# Patient Record
Sex: Female | Born: 1950
Health system: Southern US, Community
[De-identification: ages and names within clinical notes are randomized; demographics above are authoritative.]

## PROBLEM LIST (undated history)

## (undated) DIAGNOSIS — T8859XA Other complications of anesthesia, initial encounter: Secondary | ICD-10-CM

## (undated) DIAGNOSIS — N189 Chronic kidney disease, unspecified: Secondary | ICD-10-CM

## (undated) DIAGNOSIS — K219 Gastro-esophageal reflux disease without esophagitis: Secondary | ICD-10-CM

## (undated) DIAGNOSIS — E782 Mixed hyperlipidemia: Secondary | ICD-10-CM

## (undated) DIAGNOSIS — M67919 Unspecified disorder of synovium and tendon, unspecified shoulder: Secondary | ICD-10-CM

## (undated) DIAGNOSIS — M1990 Unspecified osteoarthritis, unspecified site: Secondary | ICD-10-CM

## (undated) DIAGNOSIS — G589 Mononeuropathy, unspecified: Secondary | ICD-10-CM

## (undated) DIAGNOSIS — T4145XA Adverse effect of unspecified anesthetic, initial encounter: Secondary | ICD-10-CM

## (undated) DIAGNOSIS — I10 Essential (primary) hypertension: Secondary | ICD-10-CM

## (undated) HISTORY — DX: Mononeuropathy, unspecified: G58.9

## (undated) HISTORY — DX: Chronic kidney disease, unspecified: N18.9

## (undated) HISTORY — DX: Mixed hyperlipidemia: E78.2

## (undated) HISTORY — PX: WISDOM TOOTH EXTRACTION: SHX21

## (undated) HISTORY — DX: Essential (primary) hypertension: I10

## (undated) HISTORY — PX: TUBAL LIGATION: SHX77

## (undated) HISTORY — PX: NECK SURGERY: SHX720

## (undated) HISTORY — PX: ABDOMINAL HYSTERECTOMY: SHX81

## (undated) HISTORY — PX: CHOLECYSTECTOMY: SHX55

## (undated) HISTORY — PX: CARPAL TUNNEL RELEASE: SHX101

## (undated) HISTORY — PX: BILATERAL OOPHORECTOMY: SHX1221

## (undated) HISTORY — PX: TONSILLECTOMY: SUR1361

---

## 2001-03-27 ENCOUNTER — Ambulatory Visit (HOSPITAL_COMMUNITY): Admission: RE | Admit: 2001-03-27 | Discharge: 2001-03-27 | Payer: Self-pay | Admitting: Neurosurgery

## 2001-06-30 ENCOUNTER — Emergency Department (HOSPITAL_COMMUNITY): Admission: EM | Admit: 2001-06-30 | Discharge: 2001-06-30 | Payer: Self-pay | Admitting: Emergency Medicine

## 2001-07-01 ENCOUNTER — Encounter: Payer: Self-pay | Admitting: *Deleted

## 2001-07-01 ENCOUNTER — Emergency Department (HOSPITAL_COMMUNITY): Admission: EM | Admit: 2001-07-01 | Discharge: 2001-07-01 | Payer: Self-pay | Admitting: Emergency Medicine

## 2001-08-08 ENCOUNTER — Encounter: Admission: RE | Admit: 2001-08-08 | Discharge: 2001-08-08 | Payer: Self-pay | Admitting: Neurological Surgery

## 2001-08-08 ENCOUNTER — Encounter: Payer: Self-pay | Admitting: Neurological Surgery

## 2001-11-19 ENCOUNTER — Encounter: Payer: Self-pay | Admitting: General Surgery

## 2001-11-19 ENCOUNTER — Ambulatory Visit (HOSPITAL_COMMUNITY): Admission: RE | Admit: 2001-11-19 | Discharge: 2001-11-19 | Payer: Self-pay | Admitting: General Surgery

## 2002-12-09 ENCOUNTER — Encounter: Payer: Self-pay | Admitting: General Surgery

## 2002-12-09 ENCOUNTER — Ambulatory Visit (HOSPITAL_COMMUNITY): Admission: RE | Admit: 2002-12-09 | Discharge: 2002-12-09 | Payer: Self-pay | Admitting: General Surgery

## 2002-12-19 ENCOUNTER — Encounter: Admission: RE | Admit: 2002-12-19 | Discharge: 2002-12-19 | Payer: Self-pay | Admitting: Neurological Surgery

## 2002-12-19 ENCOUNTER — Encounter: Payer: Self-pay | Admitting: Neurological Surgery

## 2003-01-17 ENCOUNTER — Encounter: Payer: Self-pay | Admitting: Neurological Surgery

## 2003-01-17 ENCOUNTER — Encounter: Admission: RE | Admit: 2003-01-17 | Discharge: 2003-01-17 | Payer: Self-pay | Admitting: Neurological Surgery

## 2003-01-31 ENCOUNTER — Encounter: Admission: RE | Admit: 2003-01-31 | Discharge: 2003-01-31 | Payer: Self-pay | Admitting: Neurological Surgery

## 2003-01-31 ENCOUNTER — Encounter: Payer: Self-pay | Admitting: Neurological Surgery

## 2004-01-19 ENCOUNTER — Ambulatory Visit (HOSPITAL_COMMUNITY): Admission: RE | Admit: 2004-01-19 | Discharge: 2004-01-19 | Payer: Self-pay | Admitting: General Surgery

## 2005-02-25 ENCOUNTER — Ambulatory Visit (HOSPITAL_COMMUNITY): Admission: RE | Admit: 2005-02-25 | Discharge: 2005-02-25 | Payer: Self-pay | Admitting: General Surgery

## 2006-02-27 ENCOUNTER — Ambulatory Visit (HOSPITAL_COMMUNITY): Admission: RE | Admit: 2006-02-27 | Discharge: 2006-02-27 | Payer: Self-pay | Admitting: General Surgery

## 2007-03-05 ENCOUNTER — Ambulatory Visit (HOSPITAL_COMMUNITY): Admission: RE | Admit: 2007-03-05 | Discharge: 2007-03-05 | Payer: Self-pay | Admitting: General Surgery

## 2008-01-08 ENCOUNTER — Ambulatory Visit (HOSPITAL_COMMUNITY): Admission: RE | Admit: 2008-01-08 | Discharge: 2008-01-08 | Payer: Self-pay | Admitting: Pulmonary Disease

## 2008-02-26 ENCOUNTER — Ambulatory Visit: Payer: Self-pay | Admitting: Internal Medicine

## 2008-02-26 ENCOUNTER — Encounter: Payer: Self-pay | Admitting: Internal Medicine

## 2008-02-26 ENCOUNTER — Ambulatory Visit (HOSPITAL_COMMUNITY): Admission: RE | Admit: 2008-02-26 | Discharge: 2008-02-26 | Payer: Self-pay | Admitting: Internal Medicine

## 2008-03-06 ENCOUNTER — Ambulatory Visit (HOSPITAL_COMMUNITY): Admission: RE | Admit: 2008-03-06 | Discharge: 2008-03-06 | Payer: Self-pay | Admitting: General Surgery

## 2009-03-10 ENCOUNTER — Ambulatory Visit (HOSPITAL_COMMUNITY): Admission: RE | Admit: 2009-03-10 | Discharge: 2009-03-10 | Payer: Self-pay | Admitting: General Surgery

## 2010-03-16 ENCOUNTER — Ambulatory Visit (HOSPITAL_COMMUNITY): Admission: RE | Admit: 2010-03-16 | Discharge: 2010-03-16 | Payer: Self-pay | Admitting: General Surgery

## 2011-03-01 ENCOUNTER — Other Ambulatory Visit (HOSPITAL_COMMUNITY): Payer: Self-pay | Admitting: Pulmonary Disease

## 2011-03-01 DIAGNOSIS — Z139 Encounter for screening, unspecified: Secondary | ICD-10-CM

## 2011-03-22 ENCOUNTER — Ambulatory Visit (HOSPITAL_COMMUNITY)
Admission: RE | Admit: 2011-03-22 | Discharge: 2011-03-22 | Disposition: A | Discharge status: Home / Self Care | Payer: Managed Care, Other (non HMO) | Source: Ambulatory Visit | Attending: Pulmonary Disease | Admitting: Pulmonary Disease

## 2011-03-22 DIAGNOSIS — Z1231 Encounter for screening mammogram for malignant neoplasm of breast: Secondary | ICD-10-CM | POA: Insufficient documentation

## 2011-03-22 DIAGNOSIS — Z139 Encounter for screening, unspecified: Secondary | ICD-10-CM

## 2011-04-14 ENCOUNTER — Encounter: Payer: Self-pay | Admitting: Cardiology

## 2011-04-19 NOTE — Op Note (Signed)
NAME:  Kathryn Lynn, Kathryn Lynn                 ACCOUNT NO.:  [id number]   MEDICAL RECORD NO.:  [id number]          PATIENT TYPE:  AMB   LOCATION:  DAY                           FACILITY:  [facility]   PHYSICIAN:  R. Roetta Sessions, M.D. DATE OF BIRTH:  1951/02/13   DATE OF PROCEDURE:  DATE OF DISCHARGE:                               OPERATIVE REPORT   PROCEDURE:  Ileocolonoscopy with biopsy.   INDICATIONS FOR PROCEDURE:  A 56-year Caucasian female with no lower GI  tract symptoms sent over out of the courtesy of Dr. Juanetta Gosling for  colorectal cancer screening.  She has never had her lower GI tract  imaged.  There is no family history of colon cancer.  Colonoscopy is now  being done as a screening maneuver.  The risks, benefits, alternatives  and limitations have been reviewed.  All party's questions were  answered.  All parties were agreeable.  Please see the documentation in  the medical record.   PROCEDURE NOTE:  O2 saturation, blood pressure, pulse, and respirations  were monitored throughout the entire procedure.  Conscious sedation:  Versed 4 mg IV, Demerol 50 mg IV in divided doses.  Instrument:  Pentax  video chip system.   FINDINGS:  Digital rectal exam revealed no abnormalities.  Endoscopic  findings:  The prep was adequate.  Colon:  The colonic mucosa was  surveyed from the rectosigmoid junction through the left, transverse,  right colon to the area of the ileocecal valve and appendiceal orifice.  These structures well seen and photographed for the record.  The  terminal ileum was intubated to 10 cm.  From this level, the scope was  slowly withdrawn, and all previously mentioned mucosal surfaces were  again seen.  In the mid ascending colon, there was an oval 3 x 5-cm area  with circular folds coming out.  Some of these folds were very shallow.  This did not appear to be an out and out polyp.  It almost appeared to  be possibly an inverted tic, although there were no diverticula seen  otherwise.  I cold biopsied this lesion.  Please see photos.  The  remainder of the colonic mucosa appeared normal.  The scope was pulled  down to the rectum where thorough examination of the rectal mucosa  including retroflex view of the anal verge demonstrated no  abnormalities.  The patient tolerated the procedure well and was  reactive in endoscopy.   IMPRESSION:  1. Normal rectum.  2. Polypoid mucosa, as described above, in ascending segment, likely      not clinically significant, status post cold biopsy.  Otherwise,      the remainder of colonic mucosa and  terminal mucosa appeared      normal.   RECOMMENDATIONS:  1. Follow up on pathology.  2. Further recommendations to follow.      Jonathon Bellows, M.D.  Electronically Signed     RMR/MEDQ  D:  02/26/2008  T:  02/26/2008  Job:  [id number]

## 2011-04-22 NOTE — Consult Note (Signed)
[place]. [facility]  Patient:    Kathryn Lynn, Kathryn Lynn                          MRN: [id number] Proc. Date: 07/01/01 Adm. Date:  [id number] Attending:  Doug Sou                          Consultation Report  REFERRING PHYSICIAN:  Lorre Nick, M.D.  REASON FOR CONSULTATION:  Cervical spondylosis with radiculopathy.  HISTORY OF PRESENT ILLNESS:  The patient is a 60 year old individual who has had significant neck, shoulder, and left arm pain that got worse severely yesterday. She was seen in the emergency room and given some analgesic pain medication. The patient noted that the pain worsened to involve the left arm, the region of the breast, the back of the shoulder, with numbness and tingling in the hands. She contacted the office today and was advised to return to the emergency room for further evaluation.  The patient notes that in 1997 she had a herniated nucleus pulposus at the C5-C6 level and was operated on by Danae Orleans. Venetia Maxon, M.D. from our office with a posterior approach. She did well after this surgery and notes that much of the pain that she has now seems to replicate what she was experiencing then. She feels now it is more severe.  On her arrival here, an MRI of the cervical spine was ordered. This study on review demonstrates spondylitic disease at C4-5 and C5-6 with biforaminal stenosis at each level but not so severe as to require immediate surgical intervention.  PAST MEDICAL HISTORY:  Reveals that the patients general health has been good. She does have some gastroesophageal reflux disease since her cholecystectomy some years ago and uses a generic form of Tagamet on a p.r.n. basis.  CURRENT MEDICATIONS:  She uses no other medication on a chronic basis, save for Premarin.  SOCIAL HISTORY:  Reveals that she is married. 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  REVIEW OF SYSTEMS:  She underwent carpal tunnel release on  the left hand in May of this year by Danae Orleans. Venetia Maxon, M.D.  PHYSICAL EXAMINATION:  NEUROLOGICAL:  She is an alert, oriented, cooperative individual in modest distress with left shoulder and neck pain. There is tenderness to palpation in the supraclavicular fossa on the left side. No masses are noted. No bruits are heard. Range of motion is limited turning 30 degrees to the left. She has a positive Spurling maneuver. Right-sided strength and reflexes are normal. No tenderness or masses are noted on her right side. Lower extremity strength and reflexes are 2+ in the patellae, 1+ in the Achilles. Notably, the patient notes that she has had some numbness and aching in the lower extremities also.  IMPRESSION:  The patient has cervical spondylosis with left cervical radiculopathy. At this time, I have advised her that we treat this with a powerful anti-inflammatory in the form of some IM Decadron followed by an oral course of steroids for a 10-day period. The patient did deny any history of diabetes at this time. Hopefully, she will tolerate this medication well and get good relief. I have also written a prescription for some hydrocodone for pain. I have advised that the patient should remain out of work for the next two days time, and I have also written for the patient to be placed in a hard cervical  collar. The follow-up will be on an outpatient basis. DD:  07/01/01 TD:  07/02/01 Job: [id number]

## 2011-04-22 NOTE — Op Note (Signed)
[place]. [facility]  Patient:    Kathryn Lynn, Kathryn Lynn                          MRN: [id number] Proc. Date: 03/27/01 Adm. Date:  [id number] Disc. Date: [id number] Attending:  Josie Saunders                           Operative Report  PREOPERATIVE DIAGNOSIS:  Left carpal tunnel syndrome.  POSTOPERATIVE DIAGNOSIS:  Left carpal tunnel syndrome.  PROCEDURE:  Left carpal tunnel release.  SURGEON:  Danae Orleans. Venetia Maxon, M.D.  ANESTHESIA:  Local lidocaine with IV sedation.  COMPLICATIONS:  None.  DISPOSITION:  Recovery.  INDICATIONS:  Kathryn Lynn is a 60 year old right-handed woman, who has a history of bilateral carpal tunnel syndrome, left more affected than right confirmed with EMG nerve conduction velocity, who has tried to get by without surgery, but has had progressively increasing pain and numbness and it was elected to go ahead with left carpal tunnel release as this is the most affected side.  DESCRIPTION OF PROCEDURE:  Ms. Adan was is brought to the operating room. She had intravenous access established and had a intravenous sedation and her left hand and forearm were then prepped and draped in the usual sterile fashion.  Area of plain incision was infiltrated with 1% local lidocaine without epinephrine.  The incision was made along the line of the fourth ray from the first volar wrist crease into the palm over approximately 3 cm length.  This was carried through subcutaneous fat to the flexor retinaculum, which was incised sharply with a 15-blade.  The carpal tunnel was identified and was further released into the palm and also the flexor retinaculum was transected more proximally up into the forearm.  Hemostasis was assured with bipolar electrocautery.  The wound was then copiously irrigated with bacitracin saline.  The carpal tunnel was felt to be well released.  The skin edges were then reapproximated with interrupted 3-0 nylon vertical  mattress sutures.  The wound was dressed with bacitracin, Telfa, Kerlix fluff, Kerlix wrap and Kling dressing.  Patient was taken to the recovery room in stable satisfactory condition, having tolerated her operation.  Counts were correct at the end of the case. DD:  03/27/01 TD:  03/27/01 Job: [id number]

## 2011-05-03 ENCOUNTER — Ambulatory Visit (INDEPENDENT_AMBULATORY_CARE_PROVIDER_SITE_OTHER): Payer: Managed Care, Other (non HMO) | Admitting: Cardiology

## 2011-05-03 ENCOUNTER — Encounter: Payer: Self-pay | Admitting: Cardiology

## 2011-05-03 ENCOUNTER — Encounter: Payer: Self-pay | Admitting: *Deleted

## 2011-05-03 ENCOUNTER — Other Ambulatory Visit: Payer: Self-pay | Admitting: *Deleted

## 2011-05-03 ENCOUNTER — Telehealth: Payer: Self-pay | Admitting: *Deleted

## 2011-05-03 VITALS — BP 98/63 | HR 75 | Ht 62.0 in | Wt 184.0 lb

## 2011-05-03 DIAGNOSIS — I10 Essential (primary) hypertension: Secondary | ICD-10-CM | POA: Insufficient documentation

## 2011-05-03 DIAGNOSIS — R079 Chest pain, unspecified: Secondary | ICD-10-CM | POA: Insufficient documentation

## 2011-05-03 DIAGNOSIS — E782 Mixed hyperlipidemia: Secondary | ICD-10-CM | POA: Insufficient documentation

## 2011-05-03 HISTORY — DX: Essential (primary) hypertension: I10

## 2011-05-03 NOTE — Patient Instructions (Addendum)
Your physician has requested that you have en exercise stress cardiolite. For further information please visit [email or web address] Please follow instruction sheet, as given. Your follow up will be based on your test results. If the results of your test are normal or stable, you will receive a letter. If they are abnormal, the nurse will contact you by phone. Your physician recommends that you continue on your current medications as directed. Please refer to the Current Medication list given to you today.

## 2011-05-03 NOTE — Assessment & Plan Note (Signed)
Blood pressure very well controlled today on present regimen.

## 2011-05-03 NOTE — Telephone Encounter (Signed)
CHECKING PERCERT FOR Stress Cardiolite for 05-10-2011 @ [facility]

## 2011-05-03 NOTE — Assessment & Plan Note (Signed)
Two-month history of intermittent chest tightness as outlined, with cardiac risk factors including hypertension, hyperlipidemia, and family history of premature cardiovascular disease in the patient's father. She has undergone no prior ischemic evaluation, resting ECG noncontributory. Our plan is to proceed with an exercise Cardiolite for further evaluation. Followup to be arranged based on results.
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
## 2011-05-03 NOTE — Assessment & Plan Note (Signed)
Followed by Dr. Hawkins. 

## 2011-05-09 ENCOUNTER — Telehealth: Payer: Self-pay | Admitting: Cardiology

## 2011-05-09 ENCOUNTER — Other Ambulatory Visit: Payer: Self-pay | Admitting: Cardiology

## 2011-05-09 DIAGNOSIS — R079 Chest pain, unspecified: Secondary | ICD-10-CM

## 2011-05-09 DIAGNOSIS — R0602 Shortness of breath: Secondary | ICD-10-CM

## 2011-05-09 NOTE — Telephone Encounter (Signed)
Stress echo scheduled for 05/10/2011 @ [facility]

## 2011-05-09 NOTE — Telephone Encounter (Signed)
STRESS CARDIOLITE DENIED PER [employer] / [employer].  PER DR. MCDOWELL SCHEDULE STRESS ECHO AS ALTERNATIVE.    NO PRECERT REQUIRED FOR STRESS ECHO.

## 2011-05-10 DIAGNOSIS — R072 Precordial pain: Secondary | ICD-10-CM

## 2011-05-16 ENCOUNTER — Telehealth: Payer: Self-pay | Admitting: *Deleted

## 2011-05-16 NOTE — Telephone Encounter (Signed)
Pt left message on voicemail at 12:04 asking for return call regarding results of test.  Attempted to reach pt but no answer. Left message to call back on voicemail.

## 2011-05-16 NOTE — Telephone Encounter (Signed)
Message copied by Arlyss Gandy on Mon May 16, 2011  4:19 PM ------      Message from: MCDOWELL, Illene Bolus      Created: Wed May 11, 2011  6:06 PM       Report reviewed - normal study. This is reassuring. She can followup with Dr. Juanetta Gosling, although we can certainly see her back if symptoms progress.

## 2011-05-17 NOTE — Telephone Encounter (Signed)
Pt notified of results and verbalized understanding  

## 2011-05-17 NOTE — Telephone Encounter (Signed)
Records from office visit and test faxed to Dr. Juanetta Gosling.

## 2011-12-06 DIAGNOSIS — M519 Unspecified thoracic, thoracolumbar and lumbosacral intervertebral disc disorder: Secondary | ICD-10-CM

## 2011-12-06 HISTORY — DX: Unspecified thoracic, thoracolumbar and lumbosacral intervertebral disc disorder: M51.9

## 2012-03-06 ENCOUNTER — Other Ambulatory Visit (HOSPITAL_COMMUNITY): Payer: Self-pay | Admitting: Specialist

## 2012-03-06 DIAGNOSIS — Z139 Encounter for screening, unspecified: Secondary | ICD-10-CM

## 2012-03-27 ENCOUNTER — Ambulatory Visit (HOSPITAL_COMMUNITY)
Admission: RE | Admit: 2012-03-27 | Discharge: 2012-03-27 | Disposition: A | Discharge status: Home / Self Care | Payer: Managed Care, Other (non HMO) | Source: Ambulatory Visit | Attending: Specialist | Admitting: Specialist

## 2012-03-27 DIAGNOSIS — Z139 Encounter for screening, unspecified: Secondary | ICD-10-CM

## 2012-03-27 DIAGNOSIS — Z1231 Encounter for screening mammogram for malignant neoplasm of breast: Secondary | ICD-10-CM | POA: Insufficient documentation

## 2012-03-29 ENCOUNTER — Emergency Department (HOSPITAL_COMMUNITY)
Admission: EM | Admit: 2012-03-29 | Discharge: 2012-03-30 | Disposition: A | Discharge status: Home / Self Care | Payer: Managed Care, Other (non HMO) | Attending: Emergency Medicine | Admitting: Emergency Medicine

## 2012-03-29 ENCOUNTER — Encounter (HOSPITAL_COMMUNITY): Payer: Self-pay | Admitting: Emergency Medicine

## 2012-03-29 DIAGNOSIS — R6883 Chills (without fever): Secondary | ICD-10-CM | POA: Insufficient documentation

## 2012-03-29 DIAGNOSIS — M545 Low back pain, unspecified: Secondary | ICD-10-CM | POA: Insufficient documentation

## 2012-03-29 DIAGNOSIS — IMO0001 Reserved for inherently not codable concepts without codable children: Secondary | ICD-10-CM | POA: Insufficient documentation

## 2012-03-29 MED ORDER — KETOROLAC TROMETHAMINE 30 MG/ML IJ SOLN
30.0000 mg | Freq: Once | INTRAMUSCULAR | Status: AC
  Administered 2012-03-30: 30 mg via INTRAVENOUS
  Filled 2012-03-29: qty 1

## 2012-03-29 NOTE — ED Notes (Signed)
Patient complaining of dull aching pain in back, groin pain that has gotten worse and is going down left leg. And also lower left abdominal pain.
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
## 2012-03-30 LAB — CBC
Platelets: 140 10*3/uL — ABNORMAL LOW (ref 150–400)
RBC: 4.27 MIL/uL (ref 3.87–5.11)
RDW: 12.8 % (ref 11.5–15.5)
WBC: 4.4 10*3/uL (ref 4.0–10.5)

## 2012-03-30 LAB — DIFFERENTIAL
Basophils Absolute: 0 10*3/uL (ref 0.0–0.1)
Eosinophils Absolute: 0.1 10*3/uL (ref 0.0–0.7)
Lymphocytes Relative: 41 % (ref 12–46)
Lymphs Abs: 1.8 10*3/uL (ref 0.7–4.0)
Neutrophils Relative %: 44 % (ref 43–77)

## 2012-03-30 LAB — BASIC METABOLIC PANEL WITH GFR
BUN: 29 mg/dL — ABNORMAL HIGH (ref 6–23)
CO2: 28 meq/L (ref 19–32)
Calcium: 9.5 mg/dL (ref 8.4–10.5)
Chloride: 103 meq/L (ref 96–112)
Creatinine, Ser: 1.19 mg/dL — ABNORMAL HIGH (ref 0.50–1.10)
GFR calc Af Amer: 56 mL/min — ABNORMAL LOW
GFR calc non Af Amer: 49 mL/min — ABNORMAL LOW
Glucose, Bld: 95 mg/dL (ref 70–99)
Potassium: 4.3 meq/L (ref 3.5–5.1)
Sodium: 139 meq/L (ref 135–145)

## 2012-03-30 LAB — URINALYSIS, ROUTINE W REFLEX MICROSCOPIC
Nitrite: NEGATIVE
Protein, ur: NEGATIVE mg/dL
Specific Gravity, Urine: 1.025 (ref 1.005–1.030)
Urobilinogen, UA: 0.2 mg/dL (ref 0.0–1.0)

## 2012-03-30 MED ORDER — OXYCODONE-ACETAMINOPHEN 5-325 MG PO TABS: 1.0000 | ORAL_TABLET | ORAL | Status: AC | PRN

## 2012-03-30 NOTE — Discharge Instructions (Signed)
You received a dose of ketorolac (Toradol) in the emergency department tonight. Neck gave you some relief of pain. Talk with Dr. Juanetta Gosling about possibly getting started on a similar medication as an outpatient.  Back Pain, Adult Low back pain is very common. About 1 in 5 people have back pain.The cause of low back pain is rarely dangerous. The pain often gets better over time.About half of people with a sudden onset of back pain feel better in just 2 weeks. About 8 in 10 people feel better by 6 weeks.  CAUSES Some common causes of back pain include:  Strain of the muscles or ligaments supporting the spine.   Wear and tear (degeneration) of the spinal discs.   Arthritis.   Direct injury to the back.  DIAGNOSIS Most of the time, the direct cause of low back pain is not known.However, back pain can be treated effectively even when the exact cause of the pain is unknown.Answering your caregiver's questions about your overall health and symptoms is one of the most accurate ways to make sure the cause of your pain is not dangerous. If your caregiver needs more information, he or she may order lab work or imaging tests (X-rays or MRIs).However, even if imaging tests show changes in your back, this usually does not require surgery. HOME CARE INSTRUCTIONS For many people, back pain returns.Since low back pain is rarely dangerous, it is often a condition that people can learn to [facility] their own.   Remain active. It is stressful on the back to sit or stand in one place. Do not sit, drive, or stand in one place for more than 30 minutes at a time. Take short walks on level surfaces as soon as pain allows.Try to increase the length of time you walk each day.   Do not stay in bed.Resting more than 1 or 2 days can delay your recovery.   Do not avoid exercise or work.Your body is made to move.It is not dangerous to be active, even though your back may hurt.Your back will likely heal faster if you  return to being active before your pain is gone.   Pay attention to your body when you bend and lift. Many people have less discomfortwhen lifting if they bend their knees, keep the load close to their bodies,and avoid twisting. Often, the most comfortable positions are those that put less stress on your recovering back.   Find a comfortable position to sleep. Use a firm mattress and lie on your side with your knees slightly bent. If you lie on your back, put a pillow under your knees.   Only take over-the-counter or prescription medicines as directed by your caregiver. Over-the-counter medicines to reduce pain and inflammation are often the most helpful.Your caregiver may prescribe muscle relaxant drugs.These medicines help dull your pain so you can more quickly return to your normal activities and healthy exercise.   Put ice on the injured area.   Put ice in a plastic bag.   Place a towel between your skin and the bag.   Leave the ice on for 15 to 20 minutes, 3 to 4 times a day for the first 2 to 3 days. After that, ice and heat may be alternated to reduce pain and spasms.   Ask your caregiver about trying back exercises and gentle massage. This may be of some benefit.   Avoid feeling anxious or stressed.Stress increases muscle tension and can worsen back pain.It is important to recognize when you  are anxious or stressed and learn ways to manage it.Exercise is a great option.  SEEK MEDICAL CARE IF:  You have pain that is not relieved with rest or medicine.   You have pain that does not improve in 1 week.   You have new symptoms.   You are generally not feeling well.  SEEK IMMEDIATE MEDICAL CARE IF:   You have pain that radiates from your back into your legs.   You develop new bowel or bladder control problems.   You have unusual weakness or numbness in your arms or legs.   You develop nausea or vomiting.   You develop abdominal pain.   You feel faint.  Document  Released: 11/21/2005 Document Revised: 11/10/2011 Document Reviewed: 04/11/2011 [facility] Patient Information 2012 [place], [place].  Osteoarthritis Osteoarthritis is the most common form of arthritis. It is redness, soreness, and swelling (inflammation) affecting the cartilage. Cartilage acts as a cushion, covering the ends of bones where they meet to form a joint. CAUSES  Over time, the cartilage begins to wear away. This causes bone to rub on bone. This produces pain and stiffness in the affected joints. Factors that contribute to this problem are:  Excessive body weight.   Age.   Overuse of joints.  SYMPTOMS   People with osteoarthritis usually experience joint pain, swelling, or stiffness.   Over time, the joint may lose its normal shape.   Small deposits of bone (osteophytes) may grow on the edges of the joint.   Bits of bone or cartilage can break off and float inside the joint space. This may cause more pain and damage.   Osteoarthritis can lead to depression, anxiety, feelings of helplessness, and limitations on daily activities.  The most commonly affected joints are in the:  Ends of the fingers.   Thumbs.   Neck.   Lower back.   Knees.   Hips.  DIAGNOSIS  Diagnosis is mostly based on your symptoms and exam. Tests may be helpful, including:  X-rays of the affected joint.   A computerized magnetic scan (MRI).   Blood tests to rule out other types of arthritis.   Joint fluid tests. This involves using a needle to draw fluid from the joint and examining the fluid under a microscope.  TREATMENT  Goals of treatment are to control pain, improve joint function, maintain a normal body weight, and maintain a healthy lifestyle. Treatment approaches may include:  A prescribed exercise program with rest and joint relief.   Weight control with nutritional education.   Pain relief techniques such as:   Properly applied heat and cold.   Electric pulses delivered to  nerve endings under the skin (transcutaneous electrical nerve stimulation, TENS).   Massage.   Certain supplements. Ask your caregiver before using any supplements, especially in combination with prescribed drugs.   Medicines to control pain, such as:   Acetaminophen.   Nonsteroidal anti-inflammatory drugs (NSAIDs), such as naproxen.   Narcotic or central-acting agents, such as tramadol. This drug carries a risk of addiction and is generally prescribed for short-term use.   Corticosteroids. These can be given orally or as injection. This is a short-term treatment, not recommended for routine use.   Surgery to reposition the bones and relieve pain (osteotomy) or to remove loose pieces of bone and cartilage. Joint replacement may be needed in advanced states of osteoarthritis.  HOME CARE INSTRUCTIONS  Your caregiver can recommend specific types of exercise. These may include:  Strengthening exercises. These are done  to strengthen the muscles that support joints affected by arthritis. They can be performed with weights or with exercise bands to add resistance.   Aerobic activities. These are exercises, such as brisk walking or low-impact aerobics, that get your heart pumping. They can help keep your lungs and circulatory system in shape.   Range-of-motion activities. These keep your joints limber.   Balance and agility exercises. These help you maintain daily living skills.  Learning about your condition and being actively involved in your care will help improve the course of your osteoarthritis. SEEK MEDICAL CARE IF:   You feel hot or your skin turns red.   You develop a rash in addition to your joint pain.   You have an oral temperature above 102 F (38.9 C).  FOR MORE INFORMATION  National Institute of Arthritis and Musculoskeletal and Skin Diseases: www.niams.http://www.myers.net/ [employer] on Aging: https://walker.com/ American College of Rheumatology: www.rheumatology.org Document  Released: 11/21/2005 Document Revised: 11/10/2011 Document Reviewed: 03/04/2010 [facility] Patient Information 2012 [place], [place].  Acetaminophen; Oxycodone tablets What is this medicine? ACETAMINOPHEN; OXYCODONE (a set a MEE noe fen; ox i KOE done) is a pain reliever. It is used to treat mild to moderate pain. This medicine may be used for other purposes; ask your health care provider or pharmacist if you have questions. What should I tell my health care provider before I take this medicine? They need to know if you have any of these conditions: -brain tumor -Crohn's disease, inflammatory bowel disease, or ulcerative colitis -drink more than 3 alcohol containing drinks per day -drug abuse or addiction -head injury -heart or circulation problems -kidney disease or problems going to the bathroom -liver disease -lung disease, asthma, or breathing problems -an unusual or allergic reaction to acetaminophen, oxycodone, other opioid analgesics, other medicines, foods, dyes, or preservatives -pregnant or trying to get pregnant -breast-feeding How should I use this medicine? Take this medicine by mouth with a full glass of water. Follow the directions on the prescription label. Take your medicine at regular intervals. Do not take your medicine more often than directed. Talk to your pediatrician regarding the use of this medicine in children. Special care may be needed. Patients over 30 years old may have a stronger reaction and need a smaller dose. Overdosage: If you think you have taken too much of this medicine contact a poison control center or emergency room at once. NOTE: This medicine is only for you. Do not share this medicine with others. What if I miss a dose? If you miss a dose, take it as soon as you can. If it is almost time for your next dose, take only that dose. Do not take double or extra doses. What may interact with this medicine? -alcohol or medicines that contain  alcohol -antihistamines -barbiturates like amobarbital, butalbital, butabarbital, methohexital, pentobarbital, phenobarbital, thiopental, and secobarbital -benztropine -drugs for bladder problems like solifenacin, trospium, oxybutynin, tolterodine, hyoscyamine, and methscopolamine -drugs for breathing problems like ipratropium and tiotropium -drugs for certain stomach or intestine problems like propantheline, homatropine methylbromide, glycopyrrolate, atropine, belladonna, and dicyclomine -general anesthetics like etomidate, ketamine, nitrous oxide, propofol, desflurane, enflurane, halothane, isoflurane, and sevoflurane -medicines for depression, anxiety, or psychotic disturbances -medicines for pain like codeine, morphine, pentazocine, buprenorphine, butorphanol, nalbuphine, tramadol, and propoxyphene -medicines for sleep -muscle relaxants -naltrexone -phenothiazines like perphenazine, thioridazine, chlorpromazine, mesoridazine, fluphenazine, prochlorperazine, promazine, and trifluoperazine -scopolamine -trihexyphenidyl This list may not describe all possible interactions. Give your health care provider a list of all the medicines, herbs, non-prescription  drugs, or dietary supplements you use. Also tell them if you smoke, drink alcohol, or use illegal drugs. Some items may interact with your medicine. What should I watch for while using this medicine? Tell your doctor or health care professional if your pain does not go away, if it gets worse, or if you have new or a different type of pain. You may develop tolerance to the medicine. Tolerance means that you will need a higher dose of the medication for pain relief. Tolerance is normal and is expected if you take this medicine for a long time. Do not suddenly stop taking your medicine because you may develop a severe reaction. Your body becomes used to the medicine. This does NOT mean you are addicted. Addiction is a behavior related to getting  and using a drug for a nonmedical reason. If you have pain, you have a medical reason to take pain medicine. Your doctor will tell you how much medicine to take. If your doctor wants you to stop the medicine, the dose will be slowly lowered over time to avoid any side effects. You may get drowsy or dizzy. Do not drive, use machinery, or do anything that needs mental alertness until you know how this medicine affects you. Do not stand or sit up quickly, especially if you are an older patient. This reduces the risk of dizzy or fainting spells. Alcohol may interfere with the effect of this medicine. Avoid alcoholic drinks. The medicine will cause constipation. Try to have a bowel movement at least every 2 to 3 days. If you do not have a bowel movement for 3 days, call your doctor or health care professional. Do not take Tylenol (acetaminophen) or medicines that have acetaminophen with this medicine. Too much acetaminophen can be very dangerous. Many nonprescription medicines contain acetaminophen. Always read the labels carefully to avoid taking more acetaminophen. What side effects may I notice from receiving this medicine? Side effects that you should report to your doctor or health care professional as soon as possible: -allergic reactions like skin rash, itching or hives, swelling of the face, lips, or tongue -breathing difficulties, wheezing -confusion -light headedness or fainting spells -severe stomach pain -yellowing of the skin or the whites of the eyes Side effects that usually do not require medical attention (report to your doctor or health care professional if they continue or are bothersome): -dizziness -drowsiness -nausea -vomiting This list may not describe all possible side effects. Call your doctor for medical advice about side effects. You may report side effects to FDA at 1-800-FDA-1088. Where should I keep my medicine? Keep out of the reach of children. This medicine can be  abused. Keep your medicine in a safe place to protect it from theft. Do not share this medicine with anyone. Selling or giving away this medicine is dangerous and against the law. Store at room temperature between 20 and 25 degrees C (68 and 77 degrees F). Keep container tightly closed. Protect from light. Flush any unused medicines down the toilet. Do not use the medicine after the expiration date. NOTE: This sheet is a summary. It may not cover all possible information. If you have questions about this medicine, talk to your doctor, pharmacist, or health care provider.  2012, Elsevier/Gold Standard. (10/20/2008 10:01:21 AM)

## 2012-04-09 ENCOUNTER — Other Ambulatory Visit (HOSPITAL_COMMUNITY): Payer: Self-pay | Admitting: Pulmonary Disease

## 2012-04-09 DIAGNOSIS — M545 Low back pain, unspecified: Secondary | ICD-10-CM

## 2012-04-11 ENCOUNTER — Ambulatory Visit (HOSPITAL_COMMUNITY)
Admission: RE | Admit: 2012-04-11 | Discharge: 2012-04-11 | Disposition: A | Discharge status: Home / Self Care | Payer: Managed Care, Other (non HMO) | Source: Ambulatory Visit | Attending: Pulmonary Disease | Admitting: Pulmonary Disease

## 2012-04-11 DIAGNOSIS — M545 Low back pain, unspecified: Secondary | ICD-10-CM

## 2012-08-20 ENCOUNTER — Ambulatory Visit: Payer: Managed Care, Other (non HMO) | Attending: Neurosurgery | Admitting: Physical Therapy

## 2012-08-20 DIAGNOSIS — IMO0001 Reserved for inherently not codable concepts without codable children: Secondary | ICD-10-CM | POA: Insufficient documentation

## 2012-08-20 DIAGNOSIS — M2569 Stiffness of other specified joint, not elsewhere classified: Secondary | ICD-10-CM | POA: Insufficient documentation

## 2012-08-20 DIAGNOSIS — M545 Low back pain, unspecified: Secondary | ICD-10-CM | POA: Insufficient documentation

## 2012-08-20 DIAGNOSIS — R5381 Other malaise: Secondary | ICD-10-CM | POA: Insufficient documentation

## 2012-08-22 ENCOUNTER — Encounter: Payer: Managed Care, Other (non HMO) | Admitting: *Deleted

## 2013-03-04 ENCOUNTER — Ambulatory Visit (HOSPITAL_COMMUNITY)
Admission: RE | Admit: 2013-03-04 | Discharge: 2013-03-04 | Disposition: A | Discharge status: Home / Self Care | Payer: Managed Care, Other (non HMO) | Source: Ambulatory Visit | Attending: Pulmonary Disease | Admitting: Pulmonary Disease

## 2013-03-04 DIAGNOSIS — R29898 Other symptoms and signs involving the musculoskeletal system: Secondary | ICD-10-CM | POA: Insufficient documentation

## 2013-03-04 DIAGNOSIS — M6281 Muscle weakness (generalized): Secondary | ICD-10-CM | POA: Insufficient documentation

## 2013-03-04 DIAGNOSIS — M545 Low back pain, unspecified: Secondary | ICD-10-CM | POA: Insufficient documentation

## 2013-03-04 DIAGNOSIS — R262 Difficulty in walking, not elsewhere classified: Secondary | ICD-10-CM | POA: Insufficient documentation

## 2013-03-04 DIAGNOSIS — M542 Cervicalgia: Secondary | ICD-10-CM | POA: Insufficient documentation

## 2013-03-04 DIAGNOSIS — I10 Essential (primary) hypertension: Secondary | ICD-10-CM | POA: Insufficient documentation

## 2013-03-04 DIAGNOSIS — IMO0001 Reserved for inherently not codable concepts without codable children: Secondary | ICD-10-CM | POA: Insufficient documentation

## 2013-03-04 HISTORY — DX: Other symptoms and signs involving the musculoskeletal system: R29.898
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
## 2013-03-06 ENCOUNTER — Other Ambulatory Visit (HOSPITAL_COMMUNITY): Payer: Self-pay | Admitting: Specialist

## 2013-03-06 DIAGNOSIS — Z139 Encounter for screening, unspecified: Secondary | ICD-10-CM

## 2013-04-01 ENCOUNTER — Ambulatory Visit (HOSPITAL_COMMUNITY)
Admission: RE | Admit: 2013-04-01 | Discharge: 2013-04-01 | Disposition: A | Discharge status: Home / Self Care | Payer: Managed Care, Other (non HMO) | Source: Ambulatory Visit | Attending: Specialist | Admitting: Specialist

## 2013-04-01 DIAGNOSIS — Z1231 Encounter for screening mammogram for malignant neoplasm of breast: Secondary | ICD-10-CM | POA: Insufficient documentation

## 2013-04-01 DIAGNOSIS — Z139 Encounter for screening, unspecified: Secondary | ICD-10-CM

## 2013-04-12 ENCOUNTER — Ambulatory Visit (HOSPITAL_COMMUNITY)
Admission: RE | Admit: 2013-04-12 | Discharge: 2013-04-12 | Disposition: A | Discharge status: Home / Self Care | Payer: Managed Care, Other (non HMO) | Source: Ambulatory Visit | Attending: Pulmonary Disease | Admitting: Pulmonary Disease

## 2013-04-12 DIAGNOSIS — IMO0001 Reserved for inherently not codable concepts without codable children: Secondary | ICD-10-CM | POA: Insufficient documentation

## 2013-04-12 DIAGNOSIS — M545 Low back pain, unspecified: Secondary | ICD-10-CM | POA: Insufficient documentation

## 2013-04-12 DIAGNOSIS — M6281 Muscle weakness (generalized): Secondary | ICD-10-CM | POA: Insufficient documentation

## 2013-07-03 ENCOUNTER — Ambulatory Visit (HOSPITAL_COMMUNITY)
Admission: RE | Admit: 2013-07-03 | Discharge: 2013-07-03 | Disposition: A | Discharge status: Home / Self Care | Payer: BC Managed Care – PPO | Source: Ambulatory Visit | Attending: Pulmonary Disease | Admitting: Pulmonary Disease

## 2013-07-03 ENCOUNTER — Other Ambulatory Visit (HOSPITAL_COMMUNITY): Payer: Self-pay | Admitting: Pulmonary Disease

## 2013-07-03 DIAGNOSIS — R0602 Shortness of breath: Secondary | ICD-10-CM

## 2013-12-31 ENCOUNTER — Other Ambulatory Visit (HOSPITAL_COMMUNITY): Payer: Self-pay | Admitting: Pulmonary Disease

## 2013-12-31 DIAGNOSIS — R1011 Right upper quadrant pain: Secondary | ICD-10-CM

## 2014-01-01 ENCOUNTER — Ambulatory Visit (HOSPITAL_COMMUNITY)
Admission: RE | Admit: 2014-01-01 | Discharge: 2014-01-01 | Disposition: A | Discharge status: Home / Self Care | Payer: BC Managed Care – PPO | Source: Ambulatory Visit | Attending: Pulmonary Disease | Admitting: Pulmonary Disease

## 2014-01-01 DIAGNOSIS — R1011 Right upper quadrant pain: Secondary | ICD-10-CM | POA: Insufficient documentation

## 2014-03-20 ENCOUNTER — Other Ambulatory Visit (HOSPITAL_COMMUNITY): Payer: Self-pay | Admitting: Specialist

## 2014-03-20 DIAGNOSIS — Z1231 Encounter for screening mammogram for malignant neoplasm of breast: Secondary | ICD-10-CM

## 2014-04-04 ENCOUNTER — Ambulatory Visit (HOSPITAL_COMMUNITY)
Admission: RE | Admit: 2014-04-04 | Discharge: 2014-04-04 | Disposition: A | Discharge status: Home / Self Care | Payer: BC Managed Care – PPO | Source: Ambulatory Visit | Attending: Specialist | Admitting: Specialist

## 2014-04-04 DIAGNOSIS — Z1231 Encounter for screening mammogram for malignant neoplasm of breast: Secondary | ICD-10-CM | POA: Insufficient documentation

## 2014-12-15 ENCOUNTER — Ambulatory Visit (INDEPENDENT_AMBULATORY_CARE_PROVIDER_SITE_OTHER): Payer: Medicare FFS | Admitting: Obstetrics & Gynecology

## 2014-12-15 ENCOUNTER — Encounter: Payer: Self-pay | Admitting: Obstetrics & Gynecology

## 2014-12-15 VITALS — BP 90/63 | HR 77 | Resp 16 | Ht 62.0 in | Wt 159.0 lb

## 2014-12-15 DIAGNOSIS — Z01419 Encounter for gynecological examination (general) (routine) without abnormal findings: Secondary | ICD-10-CM

## 2014-12-15 NOTE — Patient Instructions (Signed)
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
## 2015-03-27 ENCOUNTER — Other Ambulatory Visit: Payer: Self-pay | Admitting: Obstetrics & Gynecology

## 2015-03-27 DIAGNOSIS — Z1231 Encounter for screening mammogram for malignant neoplasm of breast: Secondary | ICD-10-CM

## 2015-04-13 ENCOUNTER — Ambulatory Visit (HOSPITAL_COMMUNITY): Payer: Medicare FFS

## 2015-06-10 ENCOUNTER — Ambulatory Visit (HOSPITAL_COMMUNITY)
Admission: RE | Admit: 2015-06-10 | Discharge: 2015-06-10 | Disposition: A | Discharge status: Home / Self Care | Payer: Medicare PPO | Source: Ambulatory Visit | Attending: Obstetrics & Gynecology | Admitting: Obstetrics & Gynecology

## 2015-06-10 DIAGNOSIS — Z1231 Encounter for screening mammogram for malignant neoplasm of breast: Secondary | ICD-10-CM | POA: Diagnosis present

## 2015-06-26 ENCOUNTER — Ambulatory Visit (HOSPITAL_COMMUNITY): Payer: Medicare FFS

## 2015-10-06 ENCOUNTER — Other Ambulatory Visit (HOSPITAL_COMMUNITY): Payer: Self-pay | Admitting: Pulmonary Disease

## 2015-10-06 DIAGNOSIS — M159 Polyosteoarthritis, unspecified: Secondary | ICD-10-CM

## 2015-10-06 DIAGNOSIS — M150 Primary generalized (osteo)arthritis: Principal | ICD-10-CM

## 2015-10-06 DIAGNOSIS — M8949 Other hypertrophic osteoarthropathy, multiple sites: Secondary | ICD-10-CM

## 2015-10-08 ENCOUNTER — Ambulatory Visit (HOSPITAL_COMMUNITY)
Admission: RE | Admit: 2015-10-08 | Discharge: 2015-10-08 | Disposition: A | Discharge status: Home / Self Care | Payer: Medicare PPO | Source: Ambulatory Visit | Attending: Pulmonary Disease | Admitting: Pulmonary Disease

## 2015-10-08 DIAGNOSIS — M150 Primary generalized (osteo)arthritis: Secondary | ICD-10-CM | POA: Insufficient documentation

## 2015-10-08 DIAGNOSIS — M159 Polyosteoarthritis, unspecified: Secondary | ICD-10-CM

## 2015-10-08 DIAGNOSIS — Z780 Asymptomatic menopausal state: Secondary | ICD-10-CM | POA: Insufficient documentation

## 2015-10-08 DIAGNOSIS — M8949 Other hypertrophic osteoarthropathy, multiple sites: Secondary | ICD-10-CM

## 2015-10-14 ENCOUNTER — Other Ambulatory Visit (HOSPITAL_COMMUNITY): Payer: Self-pay | Admitting: Pulmonary Disease

## 2015-10-14 DIAGNOSIS — R9389 Abnormal findings on diagnostic imaging of other specified body structures: Secondary | ICD-10-CM

## 2015-10-15 ENCOUNTER — Ambulatory Visit (HOSPITAL_COMMUNITY)
Admission: RE | Admit: 2015-10-15 | Discharge: 2015-10-15 | Disposition: A | Discharge status: Home / Self Care | Payer: Medicare PPO | Source: Ambulatory Visit | Attending: Pulmonary Disease | Admitting: Pulmonary Disease

## 2015-10-15 DIAGNOSIS — R9389 Abnormal findings on diagnostic imaging of other specified body structures: Secondary | ICD-10-CM

## 2015-10-15 DIAGNOSIS — R938 Abnormal findings on diagnostic imaging of other specified body structures: Secondary | ICD-10-CM | POA: Diagnosis present

## 2016-02-08 ENCOUNTER — Telehealth: Payer: Self-pay

## 2016-02-08 NOTE — Telephone Encounter (Signed)
Pt was referred by Dr. Juanetta GoslingHawkins for a screening colonoscopy if it is time. Her last one was 02/26/2008 by Dr. Jena Gaussourk and he is on recall for 03/2018. She said she is not having any problems and no family hx of colon cancer. She will call if she needs something before her next one is due. She is aware we will send her a letter when she is due.

## 2016-02-09 NOTE — Telephone Encounter (Signed)
Noted  

## 2016-04-04 ENCOUNTER — Other Ambulatory Visit (HOSPITAL_COMMUNITY): Payer: Self-pay | Admitting: Pulmonary Disease

## 2016-04-04 ENCOUNTER — Ambulatory Visit (HOSPITAL_COMMUNITY)
Admission: RE | Admit: 2016-04-04 | Discharge: 2016-04-04 | Disposition: A | Discharge status: Home / Self Care | Payer: Commercial Managed Care - HMO | Source: Ambulatory Visit | Attending: Pulmonary Disease | Admitting: Pulmonary Disease

## 2016-04-04 DIAGNOSIS — M47812 Spondylosis without myelopathy or radiculopathy, cervical region: Secondary | ICD-10-CM | POA: Diagnosis not present

## 2016-04-04 DIAGNOSIS — M542 Cervicalgia: Secondary | ICD-10-CM

## 2016-04-04 DIAGNOSIS — I10 Essential (primary) hypertension: Secondary | ICD-10-CM | POA: Diagnosis not present

## 2016-04-04 DIAGNOSIS — M8580 Other specified disorders of bone density and structure, unspecified site: Secondary | ICD-10-CM | POA: Diagnosis not present

## 2016-04-04 DIAGNOSIS — M545 Low back pain: Secondary | ICD-10-CM | POA: Diagnosis not present

## 2016-04-09 DIAGNOSIS — H2510 Age-related nuclear cataract, unspecified eye: Secondary | ICD-10-CM | POA: Diagnosis not present

## 2016-04-09 DIAGNOSIS — H5210 Myopia, unspecified eye: Secondary | ICD-10-CM | POA: Diagnosis not present

## 2016-04-25 ENCOUNTER — Other Ambulatory Visit (HOSPITAL_COMMUNITY): Payer: Self-pay | Admitting: Pulmonary Disease

## 2016-04-28 ENCOUNTER — Other Ambulatory Visit (HOSPITAL_COMMUNITY): Payer: Self-pay | Admitting: Pulmonary Disease

## 2016-04-28 DIAGNOSIS — M542 Cervicalgia: Secondary | ICD-10-CM

## 2016-05-05 ENCOUNTER — Ambulatory Visit (HOSPITAL_COMMUNITY)
Admission: RE | Admit: 2016-05-05 | Discharge: 2016-05-05 | Disposition: A | Discharge status: Home / Self Care | Payer: Commercial Managed Care - HMO | Source: Ambulatory Visit | Attending: Pulmonary Disease | Admitting: Pulmonary Disease

## 2016-05-05 DIAGNOSIS — M5031 Other cervical disc degeneration,  high cervical region: Secondary | ICD-10-CM | POA: Diagnosis not present

## 2016-05-05 DIAGNOSIS — M4802 Spinal stenosis, cervical region: Secondary | ICD-10-CM | POA: Insufficient documentation

## 2016-05-05 DIAGNOSIS — M5021 Other cervical disc displacement,  high cervical region: Secondary | ICD-10-CM | POA: Diagnosis not present

## 2016-05-05 DIAGNOSIS — M542 Cervicalgia: Secondary | ICD-10-CM | POA: Diagnosis present

## 2016-06-01 DIAGNOSIS — M5021 Other cervical disc displacement,  high cervical region: Secondary | ICD-10-CM | POA: Diagnosis not present

## 2016-06-01 DIAGNOSIS — G5601 Carpal tunnel syndrome, right upper limb: Secondary | ICD-10-CM | POA: Diagnosis not present

## 2016-06-01 DIAGNOSIS — M19019 Primary osteoarthritis, unspecified shoulder: Secondary | ICD-10-CM | POA: Diagnosis not present

## 2016-06-01 DIAGNOSIS — M542 Cervicalgia: Secondary | ICD-10-CM | POA: Diagnosis not present

## 2016-06-01 DIAGNOSIS — Z6832 Body mass index (BMI) 32.0-32.9, adult: Secondary | ICD-10-CM | POA: Diagnosis not present

## 2016-06-02 ENCOUNTER — Other Ambulatory Visit (HOSPITAL_COMMUNITY): Payer: Self-pay | Admitting: Pulmonary Disease

## 2016-06-02 DIAGNOSIS — Z1231 Encounter for screening mammogram for malignant neoplasm of breast: Secondary | ICD-10-CM

## 2016-06-13 ENCOUNTER — Ambulatory Visit (HOSPITAL_COMMUNITY)
Admission: RE | Admit: 2016-06-13 | Discharge: 2016-06-13 | Disposition: A | Discharge status: Home / Self Care | Payer: Commercial Managed Care - HMO | Source: Ambulatory Visit | Attending: Pulmonary Disease | Admitting: Pulmonary Disease

## 2016-06-13 DIAGNOSIS — Z1231 Encounter for screening mammogram for malignant neoplasm of breast: Secondary | ICD-10-CM | POA: Insufficient documentation

## 2016-06-15 ENCOUNTER — Other Ambulatory Visit (HOSPITAL_COMMUNITY): Payer: Self-pay | Admitting: Neurosurgery

## 2016-06-15 DIAGNOSIS — M19019 Primary osteoarthritis, unspecified shoulder: Secondary | ICD-10-CM

## 2016-08-04 DIAGNOSIS — M5412 Radiculopathy, cervical region: Secondary | ICD-10-CM | POA: Diagnosis not present

## 2016-08-04 DIAGNOSIS — R200 Anesthesia of skin: Secondary | ICD-10-CM | POA: Diagnosis not present

## 2016-08-04 DIAGNOSIS — M9981 Other biomechanical lesions of cervical region: Secondary | ICD-10-CM | POA: Diagnosis not present

## 2016-08-09 ENCOUNTER — Ambulatory Visit (HOSPITAL_COMMUNITY)
Admission: RE | Admit: 2016-08-09 | Discharge: 2016-08-09 | Disposition: A | Discharge status: Home / Self Care | Payer: Commercial Managed Care - HMO | Source: Ambulatory Visit | Attending: Neurosurgery | Admitting: Neurosurgery

## 2016-08-09 ENCOUNTER — Other Ambulatory Visit (HOSPITAL_COMMUNITY): Payer: Self-pay | Admitting: Neurosurgery

## 2016-08-09 DIAGNOSIS — X58XXXA Exposure to other specified factors, initial encounter: Secondary | ICD-10-CM | POA: Insufficient documentation

## 2016-08-09 DIAGNOSIS — M19019 Primary osteoarthritis, unspecified shoulder: Secondary | ICD-10-CM

## 2016-08-09 DIAGNOSIS — M75121 Complete rotator cuff tear or rupture of right shoulder, not specified as traumatic: Secondary | ICD-10-CM | POA: Insufficient documentation

## 2016-08-09 DIAGNOSIS — S46121A Laceration of muscle, fascia and tendon of long head of biceps, right arm, initial encounter: Secondary | ICD-10-CM | POA: Insufficient documentation

## 2016-08-09 DIAGNOSIS — S46011A Strain of muscle(s) and tendon(s) of the rotator cuff of right shoulder, initial encounter: Secondary | ICD-10-CM | POA: Diagnosis not present

## 2016-08-26 DIAGNOSIS — M75121 Complete rotator cuff tear or rupture of right shoulder, not specified as traumatic: Secondary | ICD-10-CM | POA: Diagnosis not present

## 2016-08-30 ENCOUNTER — Other Ambulatory Visit: Payer: Self-pay | Admitting: Orthopedic Surgery

## 2016-08-30 ENCOUNTER — Encounter (HOSPITAL_BASED_OUTPATIENT_CLINIC_OR_DEPARTMENT_OTHER): Payer: Self-pay | Admitting: *Deleted

## 2016-09-01 ENCOUNTER — Encounter (HOSPITAL_COMMUNITY)
Admission: RE | Admit: 2016-09-01 | Discharge: 2016-09-01 | Disposition: A | Discharge status: Home / Self Care | Payer: Commercial Managed Care - HMO | Source: Ambulatory Visit | Attending: Orthopedic Surgery | Admitting: Orthopedic Surgery

## 2016-09-01 ENCOUNTER — Other Ambulatory Visit: Payer: Self-pay

## 2016-09-06 ENCOUNTER — Other Ambulatory Visit: Payer: Self-pay | Admitting: Orthopedic Surgery

## 2016-09-09 ENCOUNTER — Encounter (HOSPITAL_BASED_OUTPATIENT_CLINIC_OR_DEPARTMENT_OTHER): Payer: Self-pay | Admitting: *Deleted

## 2016-09-19 ENCOUNTER — Encounter (HOSPITAL_BASED_OUTPATIENT_CLINIC_OR_DEPARTMENT_OTHER): Payer: Self-pay | Admitting: *Deleted

## 2016-09-19 ENCOUNTER — Ambulatory Visit (HOSPITAL_BASED_OUTPATIENT_CLINIC_OR_DEPARTMENT_OTHER)
Admission: RE | Admit: 2016-09-19 | Discharge: 2016-09-19 | Disposition: A | Discharge status: Home / Self Care | Payer: Commercial Managed Care - HMO | Source: Ambulatory Visit | Attending: Orthopedic Surgery | Admitting: Orthopedic Surgery

## 2016-09-19 ENCOUNTER — Ambulatory Visit (HOSPITAL_BASED_OUTPATIENT_CLINIC_OR_DEPARTMENT_OTHER): Payer: Commercial Managed Care - HMO | Admitting: Certified Registered"

## 2016-09-19 ENCOUNTER — Encounter (HOSPITAL_BASED_OUTPATIENT_CLINIC_OR_DEPARTMENT_OTHER)
Admission: RE | Disposition: A | Discharge status: Home / Self Care | Payer: Self-pay | Source: Ambulatory Visit | Attending: Orthopedic Surgery

## 2016-09-19 DIAGNOSIS — K219 Gastro-esophageal reflux disease without esophagitis: Secondary | ICD-10-CM | POA: Diagnosis not present

## 2016-09-19 DIAGNOSIS — Z888 Allergy status to other drugs, medicaments and biological substances status: Secondary | ICD-10-CM | POA: Diagnosis not present

## 2016-09-19 DIAGNOSIS — M7521 Bicipital tendinitis, right shoulder: Secondary | ICD-10-CM | POA: Diagnosis not present

## 2016-09-19 DIAGNOSIS — M25511 Pain in right shoulder: Secondary | ICD-10-CM | POA: Diagnosis not present

## 2016-09-19 DIAGNOSIS — M13842 Other specified arthritis, left hand: Secondary | ICD-10-CM | POA: Insufficient documentation

## 2016-09-19 DIAGNOSIS — Z880 Allergy status to penicillin: Secondary | ICD-10-CM | POA: Insufficient documentation

## 2016-09-19 DIAGNOSIS — I10 Essential (primary) hypertension: Secondary | ICD-10-CM | POA: Insufficient documentation

## 2016-09-19 DIAGNOSIS — M13872 Other specified arthritis, left ankle and foot: Secondary | ICD-10-CM | POA: Insufficient documentation

## 2016-09-19 DIAGNOSIS — Z8249 Family history of ischemic heart disease and other diseases of the circulatory system: Secondary | ICD-10-CM | POA: Diagnosis not present

## 2016-09-19 DIAGNOSIS — M7541 Impingement syndrome of right shoulder: Secondary | ICD-10-CM | POA: Diagnosis not present

## 2016-09-19 DIAGNOSIS — R600 Localized edema: Secondary | ICD-10-CM | POA: Diagnosis not present

## 2016-09-19 DIAGNOSIS — M13841 Other specified arthritis, right hand: Secondary | ICD-10-CM | POA: Diagnosis not present

## 2016-09-19 DIAGNOSIS — E782 Mixed hyperlipidemia: Secondary | ICD-10-CM | POA: Diagnosis not present

## 2016-09-19 DIAGNOSIS — M75101 Unspecified rotator cuff tear or rupture of right shoulder, not specified as traumatic: Secondary | ICD-10-CM | POA: Diagnosis not present

## 2016-09-19 DIAGNOSIS — M75121 Complete rotator cuff tear or rupture of right shoulder, not specified as traumatic: Secondary | ICD-10-CM | POA: Diagnosis not present

## 2016-09-19 DIAGNOSIS — M13871 Other specified arthritis, right ankle and foot: Secondary | ICD-10-CM | POA: Insufficient documentation

## 2016-09-19 DIAGNOSIS — G8918 Other acute postprocedural pain: Secondary | ICD-10-CM | POA: Diagnosis not present

## 2016-09-19 DIAGNOSIS — M66321 Spontaneous rupture of flexor tendons, right upper arm: Secondary | ICD-10-CM | POA: Diagnosis not present

## 2016-09-19 HISTORY — DX: Adverse effect of unspecified anesthetic, initial encounter: T41.45XA

## 2016-09-19 HISTORY — DX: Unspecified osteoarthritis, unspecified site: M19.90

## 2016-09-19 HISTORY — DX: Unspecified disorder of synovium and tendon, unspecified shoulder: M67.919

## 2016-09-19 HISTORY — DX: Other complications of anesthesia, initial encounter: T88.59XA

## 2016-09-19 HISTORY — PX: SHOULDER ARTHROSCOPY WITH ROTATOR CUFF REPAIR AND SUBACROMIAL DECOMPRESSION: SHX5686

## 2016-09-19 HISTORY — DX: Gastro-esophageal reflux disease without esophagitis: K21.9

## 2016-09-19 SURGERY — SHOULDER ARTHROSCOPY WITH ROTATOR CUFF REPAIR AND SUBACROMIAL DECOMPRESSION
Anesthesia: General | Site: Shoulder | Laterality: Right

## 2016-09-19 MED ORDER — SUCCINYLCHOLINE CHLORIDE 200 MG/10ML IV SOSY
PREFILLED_SYRINGE | INTRAVENOUS | Status: AC
  Filled 2016-09-19: qty 10

## 2016-09-19 MED ORDER — GLYCOPYRROLATE 0.2 MG/ML IJ SOLN
INTRAMUSCULAR | Status: DC | PRN
  Administered 2016-09-19: 0.2 mg via INTRAVENOUS

## 2016-09-19 MED ORDER — LACTATED RINGERS IV SOLN
INTRAVENOUS | Status: DC
  Administered 2016-09-19: 10 mL/h via INTRAVENOUS
  Administered 2016-09-19: 14:00:00 via INTRAVENOUS

## 2016-09-19 MED ORDER — DEXAMETHASONE SODIUM PHOSPHATE 10 MG/ML IJ SOLN
INTRAMUSCULAR | Status: AC
  Filled 2016-09-19: qty 1

## 2016-09-19 MED ORDER — ONDANSETRON HCL 4 MG/2ML IJ SOLN: 4.0000 mg | Freq: Once | INTRAMUSCULAR | Status: DC | PRN

## 2016-09-19 MED ORDER — GLYCOPYRROLATE 0.2 MG/ML IJ SOLN: 0.2000 mg | Freq: Once | INTRAMUSCULAR | Status: DC | PRN

## 2016-09-19 MED ORDER — FENTANYL CITRATE (PF) 100 MCG/2ML IJ SOLN
50.0000 ug | INTRAMUSCULAR | Status: DC | PRN
  Administered 2016-09-19: 100 ug via INTRAVENOUS

## 2016-09-19 MED ORDER — PROPOFOL 500 MG/50ML IV EMUL
INTRAVENOUS | Status: AC
  Filled 2016-09-19: qty 50

## 2016-09-19 MED ORDER — DEXAMETHASONE SODIUM PHOSPHATE 4 MG/ML IJ SOLN
INTRAMUSCULAR | Status: DC | PRN
  Administered 2016-09-19: 10 mg via INTRAVENOUS

## 2016-09-19 MED ORDER — ONDANSETRON HCL 4 MG/2ML IJ SOLN
INTRAMUSCULAR | Status: AC
  Filled 2016-09-19: qty 2

## 2016-09-19 MED ORDER — SODIUM CHLORIDE 0.9 % IR SOLN
Status: DC | PRN
  Administered 2016-09-19: 3000 mL

## 2016-09-19 MED ORDER — POVIDONE-IODINE 7.5 % EX SOLN: Freq: Once | CUTANEOUS | Status: DC

## 2016-09-19 MED ORDER — MIDAZOLAM HCL 2 MG/2ML IJ SOLN
1.0000 mg | INTRAMUSCULAR | Status: DC | PRN
  Administered 2016-09-19: 2 mg via INTRAVENOUS

## 2016-09-19 MED ORDER — VANCOMYCIN HCL IN DEXTROSE 1-5 GM/200ML-% IV SOLN
1000.0000 mg | INTRAVENOUS | Status: AC
  Administered 2016-09-19: 1000 mg via INTRAVENOUS

## 2016-09-19 MED ORDER — FENTANYL CITRATE (PF) 100 MCG/2ML IJ SOLN
INTRAMUSCULAR | Status: AC
  Filled 2016-09-19: qty 2

## 2016-09-19 MED ORDER — PROPOFOL 10 MG/ML IV BOLUS
INTRAVENOUS | Status: AC
  Filled 2016-09-19: qty 20

## 2016-09-19 MED ORDER — EPHEDRINE 5 MG/ML INJ
INTRAVENOUS | Status: AC
  Filled 2016-09-19: qty 10

## 2016-09-19 MED ORDER — ONDANSETRON HCL 4 MG/2ML IJ SOLN
INTRAMUSCULAR | Status: DC | PRN
  Administered 2016-09-19: 4 mg via INTRAVENOUS

## 2016-09-19 MED ORDER — PROPOFOL 10 MG/ML IV BOLUS
INTRAVENOUS | Status: DC | PRN
  Administered 2016-09-19: 150 mg via INTRAVENOUS

## 2016-09-19 MED ORDER — PHENYLEPHRINE 40 MCG/ML (10ML) SYRINGE FOR IV PUSH (FOR BLOOD PRESSURE SUPPORT)
PREFILLED_SYRINGE | INTRAVENOUS | Status: AC
  Filled 2016-09-19: qty 10

## 2016-09-19 MED ORDER — VANCOMYCIN HCL IN DEXTROSE 1-5 GM/200ML-% IV SOLN
INTRAVENOUS | Status: AC
  Filled 2016-09-19: qty 200

## 2016-09-19 MED ORDER — MIDAZOLAM HCL 2 MG/2ML IJ SOLN
INTRAMUSCULAR | Status: AC
  Filled 2016-09-19: qty 2

## 2016-09-19 MED ORDER — OXYCODONE-ACETAMINOPHEN 5-325 MG PO TABS: 1.0000 | ORAL_TABLET | ORAL | 0 refills | Status: DC | PRN

## 2016-09-19 MED ORDER — FENTANYL CITRATE (PF) 100 MCG/2ML IJ SOLN
INTRAMUSCULAR | Status: DC | PRN
  Administered 2016-09-19: 100 ug via INTRAVENOUS

## 2016-09-19 MED ORDER — SUCCINYLCHOLINE CHLORIDE 20 MG/ML IJ SOLN
INTRAMUSCULAR | Status: DC | PRN
  Administered 2016-09-19: 50 mg via INTRAVENOUS

## 2016-09-19 MED ORDER — PHENYLEPHRINE HCL 10 MG/ML IJ SOLN
INTRAMUSCULAR | Status: DC | PRN
  Administered 2016-09-19 (×4): 80 ug via INTRAVENOUS

## 2016-09-19 MED ORDER — ROCURONIUM BROMIDE 10 MG/ML (PF) SYRINGE
PREFILLED_SYRINGE | INTRAVENOUS | Status: AC
  Filled 2016-09-19: qty 10

## 2016-09-19 MED ORDER — ATROPINE SULFATE 0.4 MG/ML IV SOSY
PREFILLED_SYRINGE | INTRAVENOUS | Status: AC
  Filled 2016-09-19: qty 2.5

## 2016-09-19 MED ORDER — EPHEDRINE SULFATE 50 MG/ML IJ SOLN
INTRAMUSCULAR | Status: DC | PRN
  Administered 2016-09-19: 10 mg via INTRAVENOUS

## 2016-09-19 MED ORDER — SCOPOLAMINE 1 MG/3DAYS TD PT72: 1.0000 | MEDICATED_PATCH | Freq: Once | TRANSDERMAL | Status: DC | PRN

## 2016-09-19 MED ORDER — HYDROMORPHONE HCL 1 MG/ML IJ SOLN: 0.2500 mg | INTRAMUSCULAR | Status: DC | PRN

## 2016-09-19 MED ORDER — MEPERIDINE HCL 25 MG/ML IJ SOLN: 6.2500 mg | INTRAMUSCULAR | Status: DC | PRN

## 2016-09-19 MED ORDER — GLYCOPYRROLATE 0.2 MG/ML IV SOSY
PREFILLED_SYRINGE | INTRAVENOUS | Status: AC
  Filled 2016-09-19: qty 3

## 2016-09-19 MED ORDER — LIDOCAINE HCL (CARDIAC) 20 MG/ML IV SOLN
INTRAVENOUS | Status: DC | PRN
  Administered 2016-09-19: 30 mg via INTRAVENOUS

## 2016-09-19 MED ORDER — LIDOCAINE 2% (20 MG/ML) 5 ML SYRINGE
INTRAMUSCULAR | Status: AC
  Filled 2016-09-19: qty 5

## 2016-09-19 SURGICAL SUPPLY — 83 items
ANCHOR ALLTHRD KNTLS PEEK 5.5 (Anchor) ×6 IMPLANT
ANCHOR SUT QUATTRO KNTLS 4.5 (Anchor) ×9 IMPLANT
BENZOIN TINCTURE PRP APPL 2/3 (GAUZE/BANDAGES/DRESSINGS) IMPLANT
BLADE CLIPPER SURG (BLADE) IMPLANT
BLADE SURG 15 STRL LF DISP TIS (BLADE) IMPLANT
BLADE SURG 15 STRL SS (BLADE)
BUR OVAL 4.0 (BURR) ×3 IMPLANT
CANNULA 5.75X71 LONG (CANNULA) ×3 IMPLANT
CANNULA TWIST IN 8.25X7CM (CANNULA) ×3 IMPLANT
CHLORAPREP W/TINT 26ML (MISCELLANEOUS) ×3 IMPLANT
DECANTER SPIKE VIAL GLASS SM (MISCELLANEOUS) IMPLANT
DERMABOND ADVANCED (GAUZE/BANDAGES/DRESSINGS)
DERMABOND ADVANCED .7 DNX12 (GAUZE/BANDAGES/DRESSINGS) IMPLANT
DRAPE IMP U-DRAPE 54X76 (DRAPES) ×3 IMPLANT
DRAPE INCISE IOBAN 66X45 STRL (DRAPES) ×3 IMPLANT
DRAPE STERI 35X30 U-POUCH (DRAPES) ×3 IMPLANT
DRAPE SURG 17X23 STRL (DRAPES) ×3 IMPLANT
DRAPE U-SHAPE 47X51 STRL (DRAPES) ×3 IMPLANT
DRAPE U-SHAPE 76X120 STRL (DRAPES) ×6 IMPLANT
DRSG PAD ABDOMINAL 8X10 ST (GAUZE/BANDAGES/DRESSINGS) ×3 IMPLANT
ELECT REM PT RETURN 9FT ADLT (ELECTROSURGICAL) ×3
ELECTRODE REM PT RTRN 9FT ADLT (ELECTROSURGICAL) ×2 IMPLANT
GAUZE SPONGE 4X4 12PLY STRL (GAUZE/BANDAGES/DRESSINGS) ×3 IMPLANT
GAUZE SPONGE 4X4 16PLY XRAY LF (GAUZE/BANDAGES/DRESSINGS) IMPLANT
GAUZE XEROFORM 1X8 LF (GAUZE/BANDAGES/DRESSINGS) ×3 IMPLANT
GLOVE BIO SURGEON STRL SZ7 (GLOVE) ×6 IMPLANT
GLOVE BIO SURGEON STRL SZ7.5 (GLOVE) ×3 IMPLANT
GLOVE BIOGEL M STRL SZ7.5 (GLOVE) ×3 IMPLANT
GLOVE BIOGEL PI IND STRL 7.0 (GLOVE) ×6 IMPLANT
GLOVE BIOGEL PI IND STRL 8 (GLOVE) ×4 IMPLANT
GLOVE BIOGEL PI INDICATOR 7.0 (GLOVE) ×3
GLOVE BIOGEL PI INDICATOR 8 (GLOVE) ×2
GLOVE ECLIPSE 6.5 STRL STRAW (GLOVE) ×3 IMPLANT
GOWN STRL REUS W/ TWL LRG LVL3 (GOWN DISPOSABLE) ×6 IMPLANT
GOWN STRL REUS W/ TWL XL LVL3 (GOWN DISPOSABLE) ×2 IMPLANT
GOWN STRL REUS W/TWL LRG LVL3 (GOWN DISPOSABLE) ×3
GOWN STRL REUS W/TWL XL LVL3 (GOWN DISPOSABLE) ×1
IV NS IRRIG 3000ML ARTHROMATIC (IV SOLUTION) ×12 IMPLANT
LASSO CRESCENT QUICKPASS (SUTURE) IMPLANT
MANIFOLD NEPTUNE II (INSTRUMENTS) ×3 IMPLANT
NDL SUT 6 .5 CRC .975X.05 MAYO (NEEDLE) IMPLANT
NEEDLE 1/2 CIR CATGUT .05X1.09 (NEEDLE) IMPLANT
NEEDLE MAYO TAPER (NEEDLE)
NEEDLE SCORPION MULTI FIRE (NEEDLE) IMPLANT
NS IRRIG 1000ML POUR BTL (IV SOLUTION) IMPLANT
PACK ARTHROSCOPY DSU (CUSTOM PROCEDURE TRAY) ×3 IMPLANT
PACK BASIN DAY SURGERY FS (CUSTOM PROCEDURE TRAY) ×3 IMPLANT
PENCIL BUTTON HOLSTER BLD 10FT (ELECTRODE) IMPLANT
PROBE BIPOLAR ATHRO 135MM 90D (MISCELLANEOUS) ×3 IMPLANT
RESECTOR FULL RADIUS 4.2MM (BLADE) ×3 IMPLANT
SHEET MEDIUM DRAPE 40X70 STRL (DRAPES) IMPLANT
SLEEVE SCD COMPRESS KNEE MED (MISCELLANEOUS) ×3 IMPLANT
SLING ARM FOAM STRAP LRG (SOFTGOODS) IMPLANT
SLING ARM IMMOBILIZER LRG (SOFTGOODS) ×3 IMPLANT
SLING ARM IMMOBILIZER MED (SOFTGOODS) IMPLANT
SLING ARM MED ADULT FOAM STRAP (SOFTGOODS) IMPLANT
SLING ARM XL FOAM STRAP (SOFTGOODS) IMPLANT
SPONGE LAP 4X18 X RAY DECT (DISPOSABLE) IMPLANT
STRIP CLOSURE SKIN 1/2X4 (GAUZE/BANDAGES/DRESSINGS) IMPLANT
SUCTION FRAZIER HANDLE 10FR (MISCELLANEOUS)
SUCTION TUBE FRAZIER 10FR DISP (MISCELLANEOUS) IMPLANT
SUPPORT WRAP ARM LG (MISCELLANEOUS) IMPLANT
SUT BONE WAX W31G (SUTURE) IMPLANT
SUT ETHILON 3 0 PS 1 (SUTURE) ×3 IMPLANT
SUT ETHILON 4 0 PS 2 18 (SUTURE) IMPLANT
SUT FIBERWIRE #2 38 T-5 BLUE (SUTURE)
SUT MNCRL AB 4-0 PS2 18 (SUTURE) IMPLANT
SUT PDS AB 0 CT 36 (SUTURE) IMPLANT
SUT PROLENE 3 0 PS 2 (SUTURE) IMPLANT
SUT TIGER TAPE 7 IN WHITE (SUTURE) ×3 IMPLANT
SUT VIC AB 0 CT1 27 (SUTURE)
SUT VIC AB 0 CT1 27XBRD ANBCTR (SUTURE) IMPLANT
SUT VIC AB 2-0 SH 27 (SUTURE)
SUT VIC AB 2-0 SH 27XBRD (SUTURE) IMPLANT
SUTURE FIBERWR #2 38 T-5 BLUE (SUTURE) IMPLANT
SYR BULB 3OZ (MISCELLANEOUS) IMPLANT
TAPE FIBER 2MM 7IN #2 BLUE (SUTURE) ×6 IMPLANT
TOWEL OR 17X24 6PK STRL BLUE (TOWEL DISPOSABLE) ×3 IMPLANT
TOWEL OR NON WOVEN STRL DISP B (DISPOSABLE) ×3 IMPLANT
TUBE CONNECTING 20X1/4 (TUBING) ×3 IMPLANT
TUBING ARTHROSCOPY IRRIG 16FT (MISCELLANEOUS) ×3 IMPLANT
WATER STERILE IRR 1000ML POUR (IV SOLUTION) ×3 IMPLANT
YANKAUER SUCT BULB TIP NO VENT (SUCTIONS) IMPLANT
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
## 2016-09-19 NOTE — Anesthesia Procedure Notes (Addendum)
Anesthesia Regional Block:  Interscalene brachial plexus block  Pre-Anesthetic Checklist: ,, timeout performed, Correct Patient, Correct Site, Correct Laterality, Correct Procedure, Correct Position, site marked, Risks and benefits discussed,  Surgical consent,  Pre-op evaluation,  At surgeon's request and post-op pain management  Laterality: Right  Prep: chloraprep       Needles:  Injection technique: Single-shot  Needle Type: Other     Needle Length: 9cm 9 cm Needle Gauge: 22 and 22 G  Needle insertion depth: 5 cm   Additional Needles:  Procedures: ultrasound guided (picture in chart) and nerve stimulator Interscalene brachial plexus block Narrative:  Start time: 09/19/2016 12:20 PM End time: 09/19/2016 12:30 PM Injection made incrementally with aspirations every 5 mL.  Performed by: Personally  Anesthesiologist: Arta BruceSSEY, Marshella Tello  Additional Notes: Monitors applied. Patient sedated. Sterile prep and drape,hand hygiene and sterile gloves were used. Relevant anatomy identified.Needle position confirmed.Local anesthetic injected incrementally after negative aspiration. Local anesthetic spread visualized around nerve(s). Vascular puncture avoided. No complications. Image printed for medical record.The patient tolerated the procedure well.

## 2016-09-19 NOTE — Anesthesia Preprocedure Evaluation (Signed)
Anesthesia Evaluation  Patient identified by MRN, date of birth, ID band Patient awake    Reviewed: Allergy & Precautions  Airway Mallampati: I  TM Distance: >3 FB Neck ROM: Full    Dental   Pulmonary    Pulmonary exam normal        Cardiovascular hypertension, Pt. on medications Normal cardiovascular exam     Neuro/Psych    GI/Hepatic GERD  Medicated and Controlled,  Endo/Other    Renal/GU      Musculoskeletal   Abdominal   Peds  Hematology   Anesthesia Other Findings   Reproductive/Obstetrics                             Anesthesia Physical Anesthesia Plan  ASA: II  Anesthesia Plan: General   Post-op Pain Management:  Regional for Post-op pain   Induction: Intravenous  Airway Management Planned: Oral ETT  Additional Equipment:   Intra-op Plan:   Post-operative Plan: Extubation in OR  Informed Consent: I have reviewed the patients History and Physical, chart, labs and discussed the procedure including the risks, benefits and alternatives for the proposed anesthesia with the patient or authorized representative who has indicated his/her understanding and acceptance.     Plan Discussed with: CRNA and Surgeon  Anesthesia Plan Comments:         Anesthesia Quick Evaluation

## 2016-09-19 NOTE — Op Note (Signed)
Procedure(s): SHOULDER ARTHROSCOPY ROTATOR CUFF REPAIR AND SUBACROMIAL DECOMPRESSION Procedure Note  Kathryn Lynn female 65 y.o. 09/19/2016  Procedure(s) and Anesthesia Type: #1 right shoulder arthroscopic rotator cuff repair #2 right shoulder arthroscopic subacromial decompression #3 right shoulder arthroscopic extensive debridement long head proximal biceps tendon tear    Surgeon(s) and Role:    * Jones Broom, MD - Primary     Surgeon: Mable Paris   Assistants: Damita Lack PA-C (Danielle was present and scrubbed throughout the procedure and was essential in positioning, assisting with the camera and instrumentation,, and closure)  Anesthesia: General endotracheal anesthesia with preoperative interscalene block given by the attending anesthesiologist    Procedure Detail  SHOULDER ARTHROSCOPY ROTATOR CUFF REPAIR AND SUBACROMIAL DECOMPRESSION  Estimated Blood Loss: Min         Drains: none  Blood Given: none         Specimens: none        Complications:  * No complications entered in OR log *         Disposition: PACU - hemodynamically stable.         Condition: stable    Procedure:   INDICATIONS FOR SURGERY: The patient is 65 y.o. female who had a long history of right shoulder pain which has failed extensive conservative management. She has found to have a full-thickness rotator cuff tear and indicated for surgical treatment to try and decrease pain and restore function.   OPERATIVE FINDINGS: Examination under anesthesia: Showed no stiffness or instability.   DESCRIPTION OF PROCEDURE: The patient was identified in preoperative  holding area where I personally marked the operative site after  verifying site, side, and procedure with the patient. An interscalene block was given by the attending anesthesiologist the holding area.  The patient was taken back to the operating room where general anesthesia was induced without complication and  was placed in the beach-chair position with the back  elevated about 60 degrees and all extremities and head and neck carefully padded and  positioned.   The right upper extremity was then prepped and  draped in a standard sterile fashion. The appropriate time-out  procedure was carried out. The patient did receive IV antibiotics  within 30 minutes of incision.   A small posterior portal incision was made and the arthroscope was introduced into the joint. An anterior portal was then established above the subscapularis using needle localization. Small cannula was placed anteriorly. Diagnostic arthroscopy was then carried out.  She was immediately noted to have significant tearing of the upper border subscapularis with some retraction. There was a bare area on the upper lesser tuberosity. The long head biceps tendon was torn but there is approximate 4 cm of the tendon remaining in the joint which was flipping in and out of the glenohumeral articulation. There was significant fraying. This was resected from the superior labrum using a large biter and ArthriCare and removed from the joint and discarded. Superior labrum was then extensively debrided with shaver and ArthriCare. Glenohumeral joint surfaces were intact without significant chondromalacia. The supraspinatus was completely torn but minimally retracted.  The subscapularis easily reduced back to the tuberosity and the tendon was good and healthy. Therefore I did feel that arthroscopic repair would be of benefit to this patient. A large cannula was placed anteriorly with a small cannula placed superior lateral through the supraspinatus tear. The tuberosity was debrided down to bleeding bone. The tendon was freed from surrounding adhesions and easily reduced to the tuberosity.  The repair was then carried out by placing a fiber tape in an inverted mattress configuration through the tendinous portion of the upper subscapularis. This suture was placed  into a 4.5 mm peak push an anchor in the center of the bare area of the upper lesser tuberosity. Is brought the tendon down nicely to its anatomic position. The repair was under no significant tension.  The arthroscope was then introduced into the subacromial space a standard lateral portal was established with needle localization. The shaver was used through the lateral portal to perform extensive bursectomy. Coracoacromial ligament was examined and found to be frayed indicating chronic impingement.  Bursal sided rotator cuff tear was identified. There was extension of the tear throughout the entire supraspinatus insertion but there were a few lateral strands intact. The strands were lysed to allow formal repair the entire tendon. The tuberosity was debrided down to bleeding bone. The tendon was easily reducible over the tuberosity without any tension. The repair was then carried out by placing 2 5 5  peek screw and anchors preloaded with a fiber tape and a tiger tape just off of the articular margin anterior and posterior. These 4 suture strands were passed evenly throughout the supraspinatus medially. These were then brought over in a crossing suture bridge pattern to 2 additional 4.5 mm peak push an anchors laterally, bringing the tendon down nicely over the prepared tuberosity anemia smooth even fashion. The repair was watertight.  The coracoacromial ligament was taken down off the anterior acromion with the ArthroCare exposing a moderate hooked anterior acromial spur. A high-speed bur was then used through the lateral portal to take down the anterior acromial spur from lateral to medial in a standard acromioplasty.  The acromioplasty was also viewed from the lateral portal and the bur was used as necessary to ensure that the acromion was completely flat from posterior to anterior.  The arthroscopic equipment was removed from the joint and the portals were closed with 3-0 nylon in an interrupted fashion.  Sterile dressings were then applied including Xeroform 4 x 4's ABDs and tape. The patient was then allowed to awaken from general anesthesia, placed in a sling, transferred to the stretcher and taken to the recovery room in stable condition.   POSTOPERATIVE PLAN: The patient will be discharged home today and will followup in one week for suture removal and wound check.  She will follow the standard cuff protocol but we will not externally rotate beyond 20 until 6 weeks from surgery.

## 2016-09-19 NOTE — Progress Notes (Signed)
Assisted Dr. Ossey with right, ultrasound guided, interscalene  block. Side rails up, monitors on throughout procedure. See vital signs in flow sheet. Tolerated Procedure well. 

## 2016-09-19 NOTE — Transfer of Care (Signed)
Immediate Anesthesia Transfer of Care Note  Patient: Kathryn Lynn  Procedure(s) Performed: Procedure(s) with comments: SHOULDER ARTHROSCOPY ROTATOR CUFF REPAIR AND SUBACROMIAL DECOMPRESSION (Right) - SHOULDER ARTHROSCOPY ROTATOR CUFF REPAIR AND SUBACROMIAL DECOMPRESSION  Patient Location: PACU  Anesthesia Type:GA combined with regional for post-op pain  Level of Consciousness: sedated  Airway & Oxygen Therapy: Patient Spontanous Breathing and Patient connected to face mask oxygen  Post-op Assessment: Report given to RN and Post -op Vital signs reviewed and stable  Post vital signs: Reviewed and stable  Last Vitals:  Vitals:   09/19/16 1230 09/19/16 1545  BP: (!) 102/46   Pulse: (!) 59   Resp: (!) 9   Temp:  36.6 C    Last Pain:  Vitals:   09/19/16 1153  TempSrc: Oral         Complications: No apparent anesthesia complications

## 2016-09-19 NOTE — Discharge Instructions (Signed)
Discharge Instructions after Arthroscopic Shoulder Repair ° ° °A sling has been provided for you. Remain in your sling at all times. This includes sleeping in your sling.  °Use ice on the shoulder intermittently over the first 48 hours after surgery.  °Pain medicine has been prescribed for you.  °Use your medicine liberally over the first 48 hours, and then you can begin to taper your use. You may take Extra Strength Tylenol or Tylenol only in place of the pain pills. DO NOT take ANY nonsteroidal anti-inflammatory pain medications: Advil, Motrin, Ibuprofen, Aleve, Naproxen, or Narprosyn.  °You may remove your dressing after two days. If the incision sites are still moist, place a Band-Aid over the moist site(s). Change Band-Aids daily until dry.  °You may shower 5 days after surgery. The incisions CANNOT get wet prior to 5 days. Simply allow the water to wash over the site and then pat dry. Do not rub the incisions. Make sure your axilla (armpit) is completely dry after showering.  °Take one aspirin a day for 2 weeks after surgery, unless you have an aspirin sensitivity/ allergy or asthma. ° ° °Please call [phone] during normal business hours or [phone] after hours for any problems. Including the following: ° °- excessive redness of the incisions °- drainage for more than 4 days °- fever of more than 101.5 F ° °*Please note that pain medications will not be refilled after hours or on weekends. ° ° °Post Anesthesia Home Care Instructions ° °Activity: °Get plenty of rest for the remainder of the day. A responsible adult should stay with you for 24 hours following the procedure.  °For the next 24 hours, DO NOT: °-Drive a car °-Operate machinery °-Drink alcoholic beverages °-Take any medication unless instructed by your physician °-Make any legal decisions or sign important papers. ° °Meals: °Start with liquid foods such as gelatin or soup. Progress to regular foods as tolerated. Avoid greasy, spicy, heavy  foods. If nausea and/or vomiting occur, drink only clear liquids until the nausea and/or vomiting subsides. Call your physician if vomiting continues. ° °Special Instructions/Symptoms: °Your throat may feel dry or sore from the anesthesia or the breathing tube placed in your throat during surgery. If this causes discomfort, gargle with warm salt water. The discomfort should disappear within 24 hours. ° °If you had a scopolamine patch placed behind your ear for the management of post- operative nausea and/or vomiting: ° °1. The medication in the patch is effective for 72 hours, after which it should be removed.  Wrap patch in a tissue and discard in the trash. Wash hands thoroughly with soap and water. °2. You may remove the patch earlier than 72 hours if you experience unpleasant side effects which may include dry mouth, dizziness or visual disturbances. °3. Avoid touching the patch. Wash your hands with soap and water after contact with the patch. °  °Regional Anesthesia Blocks ° °1. Numbness or the inability to move the "blocked" extremity may last from 3-48 hours after placement. The length of time depends on the medication injected and your individual response to the medication. If the numbness is not going away after 48 hours, call your surgeon. ° °2. The extremity that is blocked will need to be protected until the numbness is gone and the  Strength has returned. Because you cannot feel it, you will need to take extra care to avoid injury. Because it may be weak, you may have difficulty moving it or using it. You may not know   what position it is in without looking at it while the block is in effect. ° °3. For blocks in the legs and feet, returning to weight bearing and walking needs to be done carefully. You will need to wait until the numbness is entirely gone and the strength has returned. You should be able to move your leg and foot normally before you try and bear weight or walk. You will need someone to  be with you when you first try to ensure you do not fall and possibly risk injury. ° °4. Bruising and tenderness at the needle site are common side effects and will resolve in a few days. ° °5. Persistent numbness or new problems with movement should be communicated to the surgeon or the [facility] ([phone])/ [facility] ([phone]). ° ° ° °

## 2016-09-19 NOTE — Anesthesia Procedure Notes (Signed)
Procedure Name: Intubation Date/Time: 09/19/2016 1:44 PM Performed by: Genevieve NorlanderLINKA, Kathryn Russom L Pre-anesthesia Checklist: Patient identified, Emergency Drugs available, Suction available, Patient being monitored and Timeout performed Patient Re-evaluated:Patient Re-evaluated prior to inductionOxygen Delivery Method: Circle system utilized Preoxygenation: Pre-oxygenation with 100% oxygen Intubation Type: IV induction Ventilation: Mask ventilation without difficulty Laryngoscope Size: Miller and 3 Tube type: Oral Tube size: 7.0 mm Number of attempts: 1 Airway Equipment and Method: Stylet and Oral airway Placement Confirmation: ETT inserted through vocal cords under direct vision,  positive ETCO2 and breath sounds checked- equal and bilateral Secured at: 20 cm Tube secured with: Tape Dental Injury: Teeth and Oropharynx as per pre-operative assessment

## 2016-09-20 ENCOUNTER — Encounter (HOSPITAL_BASED_OUTPATIENT_CLINIC_OR_DEPARTMENT_OTHER): Payer: Self-pay | Admitting: Orthopedic Surgery

## 2016-09-20 NOTE — Anesthesia Postprocedure Evaluation (Signed)
Anesthesia Post Note  Patient: Kathryn Lynn  Procedure(s) Performed: Procedure(s) (LRB): SHOULDER ARTHROSCOPY ROTATOR CUFF REPAIR AND SUBACROMIAL DECOMPRESSION (Right)  Patient location during evaluation: PACU Anesthesia Type: General Level of consciousness: awake and alert Pain management: pain level controlled Vital Signs Assessment: post-procedure vital signs reviewed and stable Respiratory status: spontaneous breathing, nonlabored ventilation, respiratory function stable and patient connected to nasal cannula oxygen Cardiovascular status: blood pressure returned to baseline and stable Postop Assessment: no signs of nausea or vomiting Anesthetic complications: no    Last Vitals:  Vitals:   09/19/16 1615 09/19/16 1630  BP: (!) 119/58 (!) 158/89  Pulse: (!) 102 93  Resp: 14 16  Temp:  36.4 C    Last Pain:  Vitals:   09/19/16 1645  TempSrc:   PainSc: 0-No pain                 Nithila Sumners DAVID

## 2016-09-28 DIAGNOSIS — Z9889 Other specified postprocedural states: Secondary | ICD-10-CM | POA: Diagnosis not present

## 2016-10-04 ENCOUNTER — Encounter (HOSPITAL_BASED_OUTPATIENT_CLINIC_OR_DEPARTMENT_OTHER): Payer: Self-pay | Admitting: Orthopedic Surgery

## 2016-10-04 ENCOUNTER — Ambulatory Visit: Payer: Commercial Managed Care - HMO | Attending: Orthopedic Surgery | Admitting: Physical Therapy

## 2016-10-04 DIAGNOSIS — M25511 Pain in right shoulder: Secondary | ICD-10-CM | POA: Insufficient documentation

## 2016-10-04 DIAGNOSIS — M25611 Stiffness of right shoulder, not elsewhere classified: Secondary | ICD-10-CM | POA: Insufficient documentation
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
## 2016-10-04 NOTE — Patient Instructions (Signed)
Instruct in pendulum exercise.

## 2016-10-06 ENCOUNTER — Ambulatory Visit: Payer: Commercial Managed Care - HMO | Attending: Orthopedic Surgery | Admitting: Physical Therapy

## 2016-10-06 ENCOUNTER — Encounter: Payer: Self-pay | Admitting: Physical Therapy

## 2016-10-06 DIAGNOSIS — M25511 Pain in right shoulder: Secondary | ICD-10-CM | POA: Diagnosis not present

## 2016-10-06 DIAGNOSIS — M25611 Stiffness of right shoulder, not elsewhere classified: Secondary | ICD-10-CM | POA: Diagnosis not present
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
## 2016-10-11 ENCOUNTER — Ambulatory Visit: Payer: Commercial Managed Care - HMO | Admitting: Physical Therapy

## 2016-10-11 ENCOUNTER — Encounter: Payer: Self-pay | Admitting: Physical Therapy

## 2016-10-11 DIAGNOSIS — M25611 Stiffness of right shoulder, not elsewhere classified: Secondary | ICD-10-CM | POA: Diagnosis not present

## 2016-10-11 DIAGNOSIS — M25511 Pain in right shoulder: Secondary | ICD-10-CM
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
## 2016-10-13 ENCOUNTER — Encounter: Payer: Commercial Managed Care - HMO | Admitting: Physical Therapy

## 2016-10-13 ENCOUNTER — Ambulatory Visit: Payer: Commercial Managed Care - HMO | Admitting: Physical Therapy

## 2016-10-13 ENCOUNTER — Encounter: Payer: Self-pay | Admitting: Physical Therapy

## 2016-10-13 DIAGNOSIS — M25611 Stiffness of right shoulder, not elsewhere classified: Secondary | ICD-10-CM | POA: Diagnosis not present

## 2016-10-13 DIAGNOSIS — M25511 Pain in right shoulder: Secondary | ICD-10-CM
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
## 2016-10-17 DIAGNOSIS — Z9889 Other specified postprocedural states: Secondary | ICD-10-CM | POA: Diagnosis not present

## 2016-10-18 ENCOUNTER — Ambulatory Visit: Payer: Commercial Managed Care - HMO | Admitting: *Deleted

## 2016-10-18 DIAGNOSIS — M25511 Pain in right shoulder: Secondary | ICD-10-CM | POA: Diagnosis not present

## 2016-10-18 DIAGNOSIS — I10 Essential (primary) hypertension: Secondary | ICD-10-CM | POA: Diagnosis not present

## 2016-10-18 DIAGNOSIS — M25611 Stiffness of right shoulder, not elsewhere classified: Secondary | ICD-10-CM

## 2016-10-18 DIAGNOSIS — E785 Hyperlipidemia, unspecified: Secondary | ICD-10-CM | POA: Diagnosis not present

## 2016-10-18 DIAGNOSIS — Z0000 Encounter for general adult medical examination without abnormal findings: Secondary | ICD-10-CM | POA: Diagnosis not present

## 2016-10-18 DIAGNOSIS — M545 Low back pain: Secondary | ICD-10-CM | POA: Diagnosis not present
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
## 2016-10-20 ENCOUNTER — Ambulatory Visit: Payer: Commercial Managed Care - HMO | Admitting: *Deleted

## 2016-10-20 DIAGNOSIS — M25611 Stiffness of right shoulder, not elsewhere classified: Secondary | ICD-10-CM

## 2016-10-20 DIAGNOSIS — M25511 Pain in right shoulder: Secondary | ICD-10-CM | POA: Diagnosis not present
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
## 2016-10-24 ENCOUNTER — Encounter: Payer: Self-pay | Admitting: Physical Therapy

## 2016-10-24 ENCOUNTER — Ambulatory Visit: Payer: Commercial Managed Care - HMO | Admitting: Physical Therapy

## 2016-10-24 DIAGNOSIS — M25611 Stiffness of right shoulder, not elsewhere classified: Secondary | ICD-10-CM | POA: Diagnosis not present

## 2016-10-24 DIAGNOSIS — M25511 Pain in right shoulder: Secondary | ICD-10-CM | POA: Diagnosis not present
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
## 2016-10-25 ENCOUNTER — Ambulatory Visit: Payer: Commercial Managed Care - HMO | Admitting: Physical Therapy

## 2016-10-26 ENCOUNTER — Encounter: Payer: Self-pay | Admitting: Physical Therapy

## 2016-10-26 ENCOUNTER — Ambulatory Visit: Payer: Commercial Managed Care - HMO | Admitting: Physical Therapy

## 2016-10-26 DIAGNOSIS — M25611 Stiffness of right shoulder, not elsewhere classified: Secondary | ICD-10-CM | POA: Diagnosis not present

## 2016-10-26 DIAGNOSIS — M25511 Pain in right shoulder: Secondary | ICD-10-CM
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
## 2016-11-03 ENCOUNTER — Ambulatory Visit: Payer: Commercial Managed Care - HMO | Admitting: Physical Therapy

## 2016-11-03 DIAGNOSIS — M25611 Stiffness of right shoulder, not elsewhere classified: Secondary | ICD-10-CM | POA: Diagnosis not present

## 2016-11-03 DIAGNOSIS — M25511 Pain in right shoulder: Secondary | ICD-10-CM
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
## 2016-11-04 ENCOUNTER — Ambulatory Visit: Payer: Commercial Managed Care - HMO | Attending: Orthopedic Surgery | Admitting: Physical Therapy

## 2016-11-04 DIAGNOSIS — M25511 Pain in right shoulder: Secondary | ICD-10-CM | POA: Diagnosis not present

## 2016-11-04 DIAGNOSIS — M25611 Stiffness of right shoulder, not elsewhere classified: Secondary | ICD-10-CM

## 2016-11-04 NOTE — Patient Instructions (Signed)
Instructed patient in supine active-assistive bench press and flexion.
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
## 2016-11-08 ENCOUNTER — Ambulatory Visit: Payer: Commercial Managed Care - HMO | Admitting: Physical Therapy

## 2016-11-08 DIAGNOSIS — M25511 Pain in right shoulder: Secondary | ICD-10-CM | POA: Diagnosis not present

## 2016-11-08 DIAGNOSIS — M25611 Stiffness of right shoulder, not elsewhere classified: Secondary | ICD-10-CM | POA: Diagnosis not present
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
## 2016-11-10 ENCOUNTER — Encounter: Payer: Self-pay | Admitting: Physical Therapy

## 2016-11-10 ENCOUNTER — Ambulatory Visit: Payer: Commercial Managed Care - HMO | Admitting: Physical Therapy

## 2016-11-10 DIAGNOSIS — M25511 Pain in right shoulder: Secondary | ICD-10-CM | POA: Diagnosis not present

## 2016-11-10 DIAGNOSIS — M25611 Stiffness of right shoulder, not elsewhere classified: Secondary | ICD-10-CM
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
## 2016-11-15 ENCOUNTER — Ambulatory Visit: Payer: Commercial Managed Care - HMO | Admitting: Physical Therapy

## 2016-11-15 DIAGNOSIS — M25611 Stiffness of right shoulder, not elsewhere classified: Secondary | ICD-10-CM | POA: Diagnosis not present

## 2016-11-15 DIAGNOSIS — M25511 Pain in right shoulder: Secondary | ICD-10-CM
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
## 2016-11-17 ENCOUNTER — Ambulatory Visit: Payer: Commercial Managed Care - HMO | Admitting: *Deleted

## 2016-11-17 DIAGNOSIS — M25511 Pain in right shoulder: Secondary | ICD-10-CM

## 2016-11-17 DIAGNOSIS — M25611 Stiffness of right shoulder, not elsewhere classified: Secondary | ICD-10-CM | POA: Diagnosis not present
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
## 2016-11-21 ENCOUNTER — Encounter: Payer: Self-pay | Admitting: Physical Therapy

## 2016-11-21 ENCOUNTER — Ambulatory Visit: Payer: Commercial Managed Care - HMO | Admitting: Physical Therapy

## 2016-11-21 DIAGNOSIS — M25611 Stiffness of right shoulder, not elsewhere classified: Secondary | ICD-10-CM

## 2016-11-21 DIAGNOSIS — M25511 Pain in right shoulder: Secondary | ICD-10-CM
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
## 2016-11-22 ENCOUNTER — Ambulatory Visit: Payer: Commercial Managed Care - HMO | Admitting: *Deleted

## 2016-11-22 DIAGNOSIS — M25611 Stiffness of right shoulder, not elsewhere classified: Secondary | ICD-10-CM | POA: Diagnosis not present

## 2016-11-22 DIAGNOSIS — M25511 Pain in right shoulder: Secondary | ICD-10-CM | POA: Diagnosis not present
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
## 2016-11-23 DIAGNOSIS — Z9889 Other specified postprocedural states: Secondary | ICD-10-CM | POA: Diagnosis not present

## 2017-08-02 ENCOUNTER — Other Ambulatory Visit (HOSPITAL_COMMUNITY): Payer: Self-pay | Admitting: Pulmonary Disease

## 2017-08-02 DIAGNOSIS — Z1231 Encounter for screening mammogram for malignant neoplasm of breast: Secondary | ICD-10-CM

## 2017-08-11 ENCOUNTER — Ambulatory Visit (HOSPITAL_COMMUNITY)
Admission: RE | Admit: 2017-08-11 | Discharge: 2017-08-11 | Disposition: A | Discharge status: Home / Self Care | Payer: Medicare PPO | Source: Ambulatory Visit | Attending: Pulmonary Disease | Admitting: Pulmonary Disease

## 2017-08-11 DIAGNOSIS — Z1231 Encounter for screening mammogram for malignant neoplasm of breast: Secondary | ICD-10-CM | POA: Diagnosis present

## 2017-10-17 ENCOUNTER — Other Ambulatory Visit (HOSPITAL_COMMUNITY): Payer: Self-pay | Admitting: Pulmonary Disease

## 2017-10-17 DIAGNOSIS — Z780 Asymptomatic menopausal state: Secondary | ICD-10-CM

## 2017-10-18 ENCOUNTER — Ambulatory Visit (HOSPITAL_COMMUNITY)
Admission: RE | Admit: 2017-10-18 | Discharge: 2017-10-18 | Disposition: A | Discharge status: Home / Self Care | Payer: Medicare PPO | Source: Ambulatory Visit | Attending: Pulmonary Disease | Admitting: Pulmonary Disease

## 2017-10-18 DIAGNOSIS — Z780 Asymptomatic menopausal state: Secondary | ICD-10-CM | POA: Diagnosis not present

## 2017-10-18 DIAGNOSIS — M8580 Other specified disorders of bone density and structure, unspecified site: Secondary | ICD-10-CM | POA: Insufficient documentation

## 2017-10-18 DIAGNOSIS — M810 Age-related osteoporosis without current pathological fracture: Secondary | ICD-10-CM | POA: Diagnosis present

## 2017-10-25 ENCOUNTER — Other Ambulatory Visit (HOSPITAL_COMMUNITY): Payer: Medicare PPO

## 2018-01-04 DIAGNOSIS — S8265XA Nondisplaced fracture of lateral malleolus of left fibula, initial encounter for closed fracture: Secondary | ICD-10-CM

## 2018-01-04 HISTORY — DX: Nondisplaced fracture of lateral malleolus of left fibula, initial encounter for closed fracture: S82.65XA

## 2018-02-05 ENCOUNTER — Encounter: Payer: Self-pay | Admitting: Internal Medicine

## 2018-03-01 IMAGING — MR MR CERVICAL SPINE W/O CM
4 of 5 series · 14 of 48 positions shown · non-contrast
Comparison: Cervical spine radiographs 04/04/2016

CLINICAL DATA: Cervicalgia. Neck pain for 3 months. Pain extends
into the right leg. Neck surgery 10 years ago.

EXAM:
MRI CERVICAL SPINE WITHOUT CONTRAST
TECHNIQUE: Multiplanar, multisequence MR imaging of the cervical spine was
performed. No intravenous contrast was administered.

[Series 3: T2 · sagittal · 3.0mm · 0.38mm/px · 5 of 13 slices shown (1 of 2)]
[im 1/13]
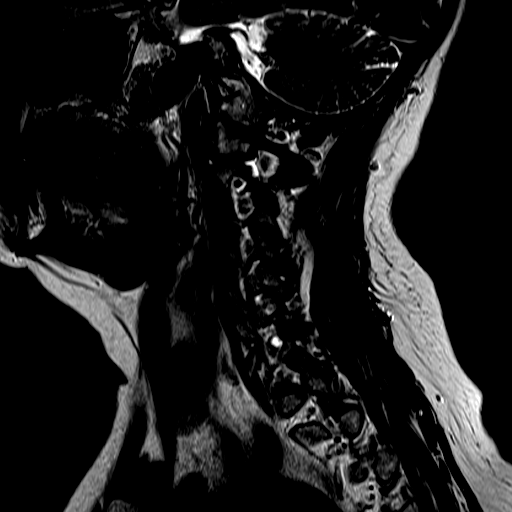
[im 3/13]
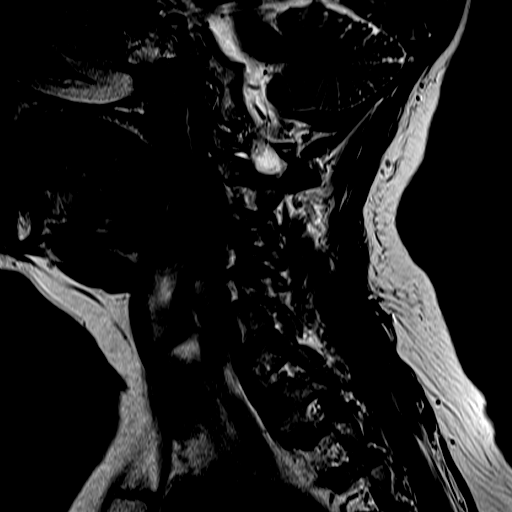
[im 5/13]
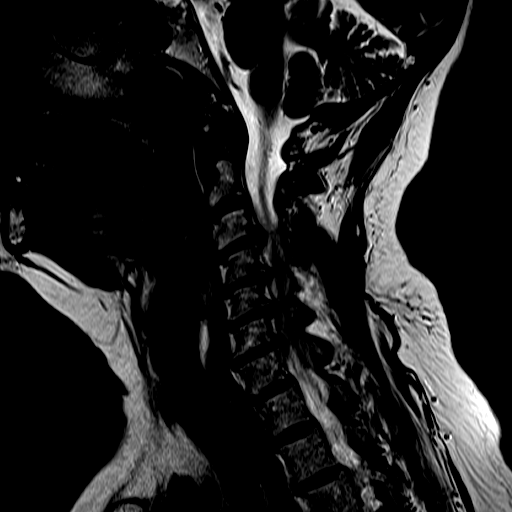
[im 8/13]
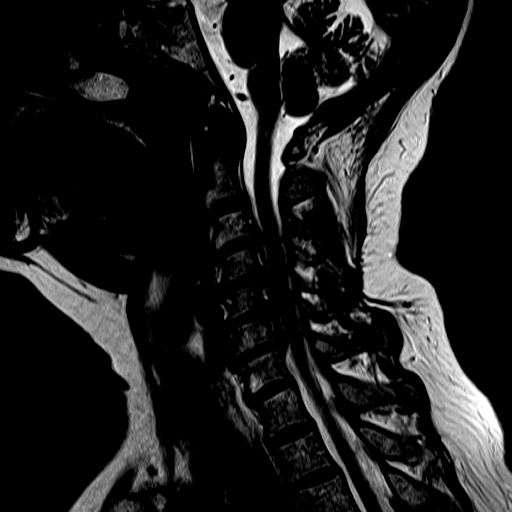
[im 13/13]
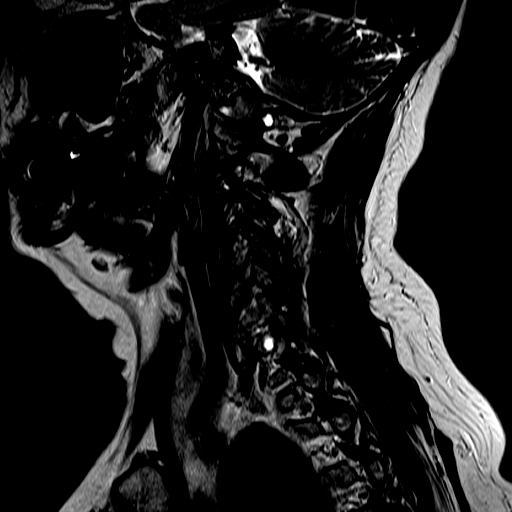

[Series 4: FLAIR · sagittal · 3.0mm · 0.40mm/px · 3 of 13 slices shown]
[im 3/13]
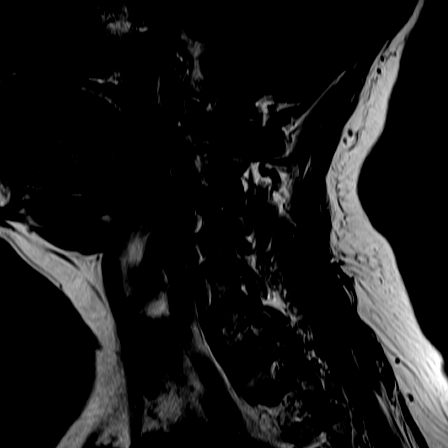
[im 8/13]
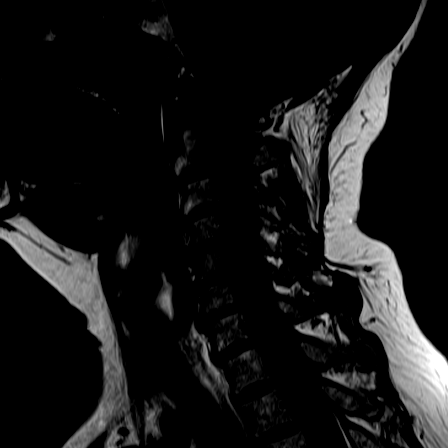
[im 13/13]
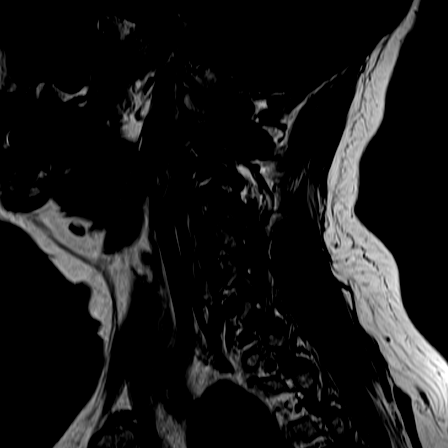

[Series 5: ir sagital · sagittal · 3.0mm · 0.22mm/px · 3 of 13 slices shown]
[im 3/13]
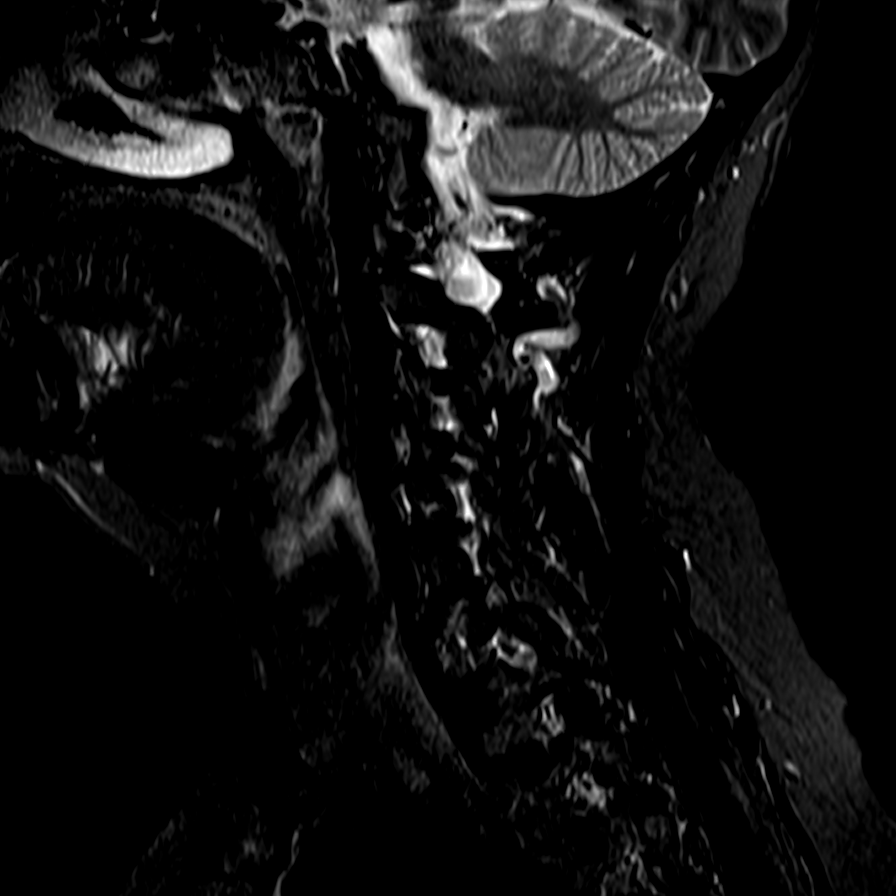
[im 8/13]
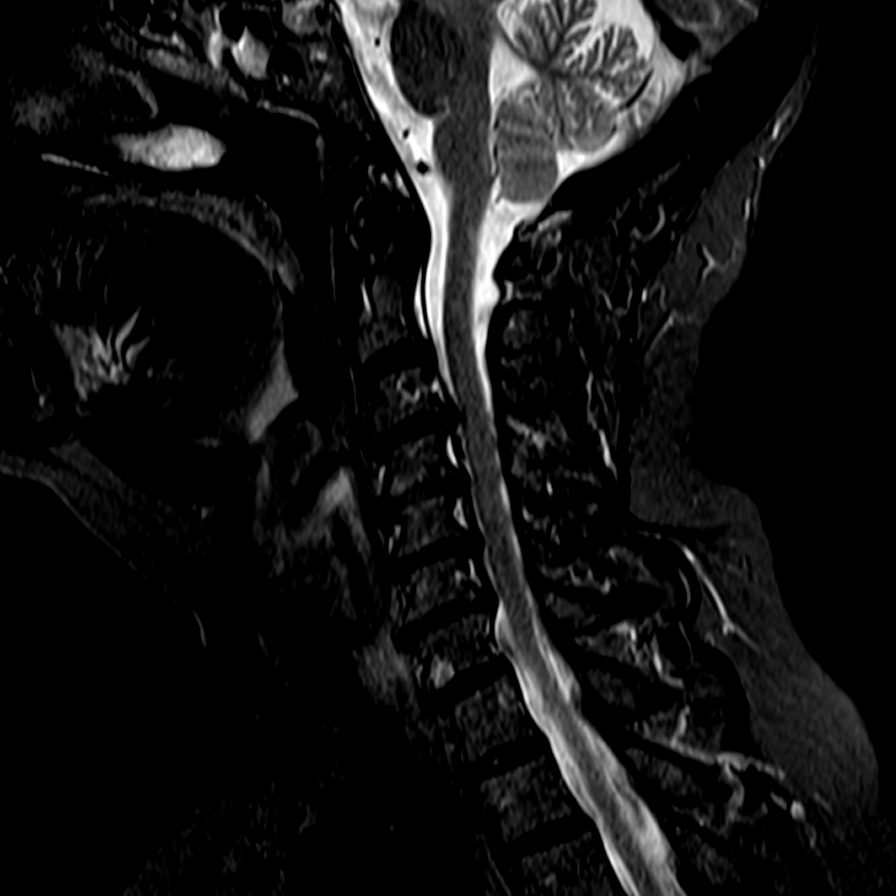
[im 13/13]
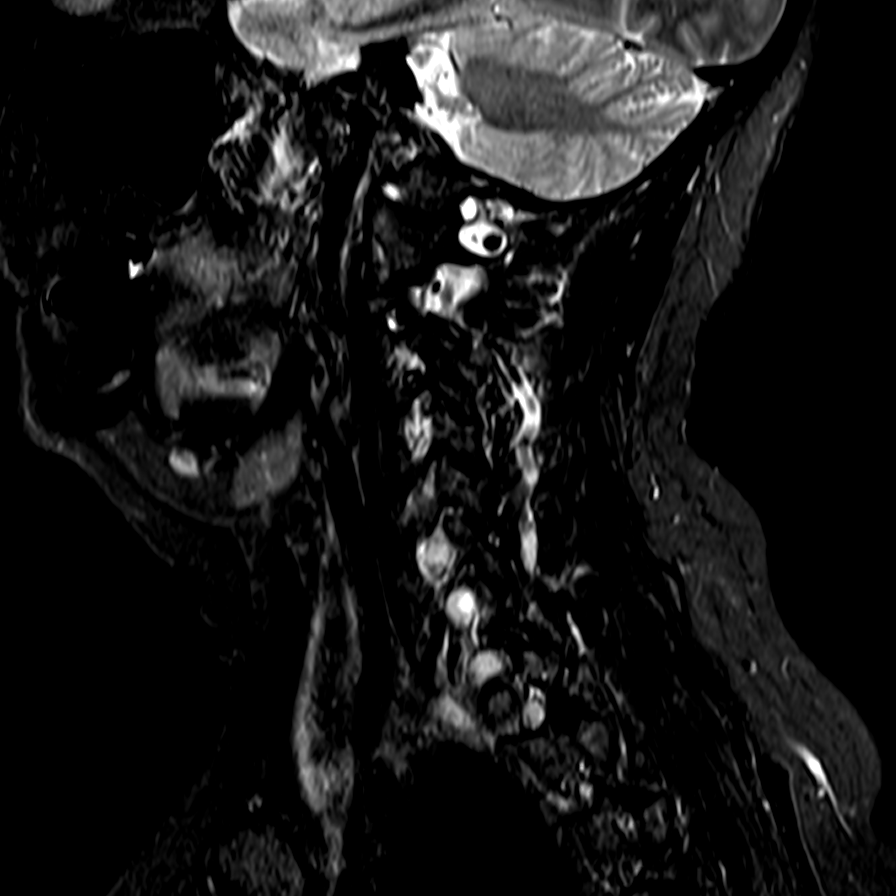

[Series 7: T2 · axial · 3.0mm · 0.19mm/px · z∈[-83,-7]mm · 3 of 34 slices shown (2 of 2)]
[im 5/34]
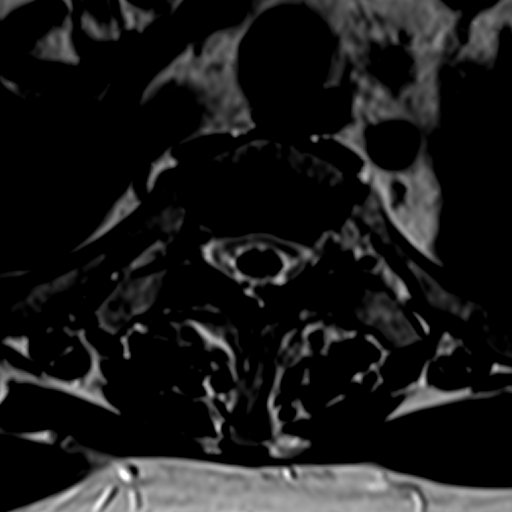
[im 17/34]
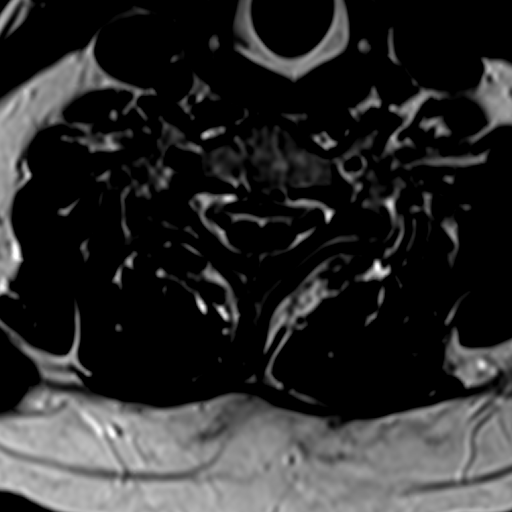
[im 29/34]
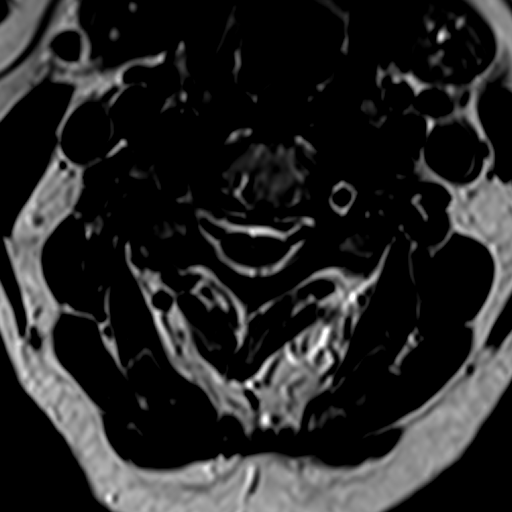

[14 of 48 positions shown; findings below may reference images not displayed]

FINDINGS: Alignment: Cervical spine straightening. 2 mm anterolisthesis of C2
on C3 and C7 on T1.

Vertebrae: Preserved vertebral body heights. Mild degenerative
endplate edema at C6-7 and C7-T1. Moderate disc space narrowing from
C[DATE]-C7-T1.

Cord: Normal in signal and intrinsic morphology.

Posterior Fossa, vertebral arteries, paraspinal tissues:
Unremarkable.

Disc levels:

C2-3: Slight disc uncovering and advanced right facet arthrosis
without stenosis.

C3-4: Moderate-sized, broad central disc protrusion results in
moderate spinal stenosis with mild cord flattening. Uncovertebral
spurring contributes to mild bilateral neural foraminal stenosis.

C4-5: Broad-based posterior disc osteophyte complex results in
moderate spinal stenosis with at most a minimal impression on the
spinal cord and mild right and moderate left neural foraminal
stenosis.

C5-6: Broad-based posterior disc osteophyte complex results in
mild-to-moderate spinal stenosis and mild bilateral neural foraminal
stenosis.

C6-7: Broad-based posterior disc osteophyte complex results in mild
left greater than right neural foraminal stenosis and borderline to
mild spinal stenosis.

C7-T1:  Moderate facet arthrosis without stenosis.
IMPRESSION: 1. Relatively diffuse cervical disc degeneration, most notable at
C3-4 where a broad disc protrusion results in moderate spinal
stenosis with mild cord flattening.
2. Spinal stenosis is moderate at C4-5, mild-to-moderate at C5-6,
and mild at C6-7.
3. Mild-to-moderate neural foraminal stenosis as above.

## 2018-09-20 ENCOUNTER — Other Ambulatory Visit (HOSPITAL_COMMUNITY): Payer: Self-pay | Admitting: Pulmonary Disease

## 2018-09-20 DIAGNOSIS — Z1231 Encounter for screening mammogram for malignant neoplasm of breast: Secondary | ICD-10-CM

## 2018-10-03 ENCOUNTER — Ambulatory Visit (HOSPITAL_COMMUNITY)
Admission: RE | Admit: 2018-10-03 | Discharge: 2018-10-03 | Disposition: A | Discharge status: Home / Self Care | Payer: Medicare PPO | Source: Ambulatory Visit | Attending: Pulmonary Disease | Admitting: Pulmonary Disease

## 2018-10-03 DIAGNOSIS — Z1231 Encounter for screening mammogram for malignant neoplasm of breast: Secondary | ICD-10-CM | POA: Diagnosis not present

## 2018-10-05 DIAGNOSIS — R17 Unspecified jaundice: Secondary | ICD-10-CM

## 2018-10-05 HISTORY — DX: Unspecified jaundice: R17

## 2018-10-17 LAB — BASIC METABOLIC PANEL
BUN: 20 (ref 4–21)
Creatinine: 1.7 — AB (ref 0.5–1.1)
Potassium: 4.8 (ref 3.4–5.3)
Sodium: 141 (ref 137–147)

## 2018-10-17 LAB — LIPID PANEL
Cholesterol: 233 — AB (ref 0–200)
HDL: 70 (ref 35–70)
LDL Cholesterol: 131
Triglycerides: 180 — AB (ref 40–160)

## 2018-10-17 LAB — TSH: TSH: 2.02 (ref 0.41–5.90)

## 2018-10-17 LAB — CBC AND DIFFERENTIAL
HCT: 42 (ref 36–46)
Hemoglobin: 14 (ref 12.0–16.0)
Platelets: 202 (ref 150–399)
WBC: 5.6

## 2018-10-17 LAB — HEPATIC FUNCTION PANEL
ALT: 15 (ref 7–35)
AST: 17 (ref 13–35)

## 2018-10-17 LAB — CBC: RBC: 4.69 (ref 3.87–5.11)

## 2019-05-10 DIAGNOSIS — L247 Irritant contact dermatitis due to plants, except food: Secondary | ICD-10-CM | POA: Diagnosis not present

## 2019-06-12 DIAGNOSIS — E785 Hyperlipidemia, unspecified: Secondary | ICD-10-CM | POA: Diagnosis not present

## 2019-06-12 DIAGNOSIS — I129 Hypertensive chronic kidney disease with stage 1 through stage 4 chronic kidney disease, or unspecified chronic kidney disease: Secondary | ICD-10-CM | POA: Diagnosis not present

## 2019-06-12 DIAGNOSIS — N182 Chronic kidney disease, stage 2 (mild): Secondary | ICD-10-CM | POA: Diagnosis not present

## 2019-06-12 DIAGNOSIS — M545 Low back pain: Secondary | ICD-10-CM | POA: Diagnosis not present

## 2019-07-18 ENCOUNTER — Other Ambulatory Visit: Payer: Self-pay

## 2019-07-18 ENCOUNTER — Ambulatory Visit (INDEPENDENT_AMBULATORY_CARE_PROVIDER_SITE_OTHER): Payer: Medicare HMO | Admitting: Otolaryngology

## 2019-07-18 DIAGNOSIS — H7203 Central perforation of tympanic membrane, bilateral: Secondary | ICD-10-CM | POA: Diagnosis not present

## 2019-07-18 DIAGNOSIS — H6983 Other specified disorders of Eustachian tube, bilateral: Secondary | ICD-10-CM | POA: Diagnosis not present

## 2019-09-23 ENCOUNTER — Other Ambulatory Visit (HOSPITAL_COMMUNITY): Payer: Self-pay | Admitting: Pulmonary Disease

## 2019-09-23 DIAGNOSIS — Z1231 Encounter for screening mammogram for malignant neoplasm of breast: Secondary | ICD-10-CM

## 2019-10-09 ENCOUNTER — Other Ambulatory Visit: Payer: Self-pay

## 2019-10-09 ENCOUNTER — Ambulatory Visit (HOSPITAL_COMMUNITY)
Admission: RE | Admit: 2019-10-09 | Discharge: 2019-10-09 | Disposition: A | Discharge status: Home / Self Care | Payer: Medicare HMO | Source: Ambulatory Visit | Attending: Pulmonary Disease | Admitting: Pulmonary Disease

## 2019-10-09 DIAGNOSIS — Z1231 Encounter for screening mammogram for malignant neoplasm of breast: Secondary | ICD-10-CM | POA: Diagnosis not present

## 2019-11-01 DIAGNOSIS — J0100 Acute maxillary sinusitis, unspecified: Secondary | ICD-10-CM | POA: Diagnosis not present

## 2019-11-01 DIAGNOSIS — J029 Acute pharyngitis, unspecified: Secondary | ICD-10-CM | POA: Diagnosis not present

## 2019-11-18 ENCOUNTER — Ambulatory Visit (INDEPENDENT_AMBULATORY_CARE_PROVIDER_SITE_OTHER): Payer: Medicare HMO | Admitting: Family Medicine

## 2019-11-18 ENCOUNTER — Encounter: Payer: Self-pay | Admitting: Family Medicine

## 2019-11-18 ENCOUNTER — Other Ambulatory Visit: Payer: Self-pay

## 2019-11-18 VITALS — BP 117/70 | HR 95 | Temp 98.1°F | Resp 17 | Ht 62.0 in | Wt 188.1 lb

## 2019-11-18 DIAGNOSIS — N1832 Chronic kidney disease, stage 3b: Secondary | ICD-10-CM | POA: Diagnosis not present

## 2019-11-18 DIAGNOSIS — E669 Obesity, unspecified: Secondary | ICD-10-CM

## 2019-11-18 DIAGNOSIS — Z7689 Persons encountering health services in other specified circumstances: Secondary | ICD-10-CM

## 2019-11-18 DIAGNOSIS — R7989 Other specified abnormal findings of blood chemistry: Secondary | ICD-10-CM

## 2019-11-18 DIAGNOSIS — R350 Frequency of micturition: Secondary | ICD-10-CM

## 2019-11-18 DIAGNOSIS — R351 Nocturia: Secondary | ICD-10-CM

## 2019-11-18 DIAGNOSIS — I10 Essential (primary) hypertension: Secondary | ICD-10-CM | POA: Diagnosis not present

## 2019-11-18 DIAGNOSIS — E782 Mixed hyperlipidemia: Secondary | ICD-10-CM | POA: Diagnosis not present

## 2019-11-18 LAB — POC URINALSYSI DIPSTICK (AUTOMATED)
Bilirubin, UA: NEGATIVE
Blood, UA: NEGATIVE
Glucose, UA: NEGATIVE
Ketones, UA: NEGATIVE
Leukocytes, UA: NEGATIVE
Nitrite, UA: NEGATIVE
Protein, UA: NEGATIVE
Spec Grav, UA: 1.02 (ref 1.010–1.025)
Urobilinogen, UA: 0.2 E.U./dL
pH, UA: 6 (ref 5.0–8.0)

## 2019-11-18 MED ORDER — SIMVASTATIN 20 MG PO TABS: 20.0000 mg | ORAL_TABLET | Freq: Every day | ORAL | 1 refills | Status: DC

## 2019-11-18 MED ORDER — LISINOPRIL 20 MG PO TABS: 20.0000 mg | ORAL_TABLET | Freq: Every day | ORAL | 1 refills | Status: DC

## 2019-11-18 NOTE — Patient Instructions (Signed)
It was great to meet you today.  I will call you with lab results and suggestions.   Try to avoid common bladder irritants from list.  Try to consume most of your daily water before 6 pm.    Urinary Frequency, Adult Urinary frequency means urinating more often than usual. You may urinate every 1-2 hours even though you drink a normal amount of fluid and do not have a bladder infection or condition. Although you urinate more often than normal, the total amount of urine produced in a day is normal. With urinary frequency, you may have an urgent need to urinate often. The stress and anxiety of needing to find a bathroom quickly can make this urge worse. This condition may go away on its own or you may need treatment at home. Home treatment may include bladder training, exercises, taking medicines, or making changes to your diet. Follow these instructions at home: Bladder health   Keep a bladder diary if told by your health care provider. Keep track of: ? What you eat and drink. ? How often you urinate. ? How much you urinate.  Follow a bladder training program if told by your health care provider. This may include: ? Learning to delay going to the bathroom. ? Double urinating (voiding). This helps if you are not completely emptying your bladder. ? Scheduled voiding.  Do Kegel exercises as told by your health care provider. Kegel exercises strengthen the muscles that help control urination, which may help the condition. Eating and drinking  If told by your health care provider, make diet changes, such as: ? Avoiding caffeine. ? Drinking fewer fluids, especially alcohol. ? Not drinking in the evening. ? Avoiding foods or drinks that may irritate the bladder. These include coffee, tea, soda, artificial sweeteners, citrus, tomato-based foods, and chocolate. ? Eating foods that help prevent or ease constipation. Constipation can make this condition worse. Your health care provider may  recommend that you:  Drink enough fluid to keep your urine pale yellow.  Take over-the-counter or prescription medicines.  Eat foods that are high in fiber, such as beans, whole grains, and fresh fruits and vegetables.  Limit foods that are high in fat and processed sugars, such as fried or sweet foods. General instructions  Take over-the-counter and prescription medicines only as told by your health care provider.  Keep all follow-up visits as told by your health care provider. This is important. Contact a health care provider if:  You start urinating more often.  You feel pain or irritation when you urinate.  You notice blood in your urine.  Your urine looks cloudy.  You develop a fever.  You begin vomiting. Get help right away if:  You are unable to urinate. Summary  Urinary frequency means urinating more often than usual. With urinary frequency, you may urinate every 1-2 hours even though you drink a normal amount of fluid and do not have a bladder infection or other bladder condition.  Your health care provider may recommend that you keep a bladder diary, follow a bladder training program, or make dietary changes.  If told by your health care provider, do Kegel exercises to strengthen the muscles that help control urination.  Take over-the-counter and prescription medicines only as told by your health care provider.  Contact a health care provider if your symptoms do not improve or get worse. This information is not intended to replace advice given to you by your health care provider. Make sure you discuss any  questions you have with your health care provider. Document Released: 09/17/2009 Document Revised: 05/31/2018 Document Reviewed: 05/31/2018 Elsevier Patient Education  2020 [employer].
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
## 2019-11-19 ENCOUNTER — Encounter: Payer: Self-pay | Admitting: Family Medicine

## 2019-11-19 LAB — CBC WITH DIFFERENTIAL/PLATELET
Basophils Absolute: 0.1 10*3/uL (ref 0.0–0.1)
Basophils Relative: 0.9 % (ref 0.0–3.0)
Eosinophils Absolute: 0.2 10*3/uL (ref 0.0–0.7)
Eosinophils Relative: 3.3 % (ref 0.0–5.0)
HCT: 40.5 % (ref 36.0–46.0)
Hemoglobin: 13.4 g/dL (ref 12.0–15.0)
Lymphocytes Relative: 42.3 % (ref 12.0–46.0)
Lymphs Abs: 2.7 10*3/uL (ref 0.7–4.0)
MCHC: 33 g/dL (ref 30.0–36.0)
MCV: 87.2 fl (ref 78.0–100.0)
Monocytes Absolute: 0.4 10*3/uL (ref 0.1–1.0)
Monocytes Relative: 6.6 % (ref 3.0–12.0)
Neutro Abs: 3 10*3/uL (ref 1.4–7.7)
Neutrophils Relative %: 46.9 % (ref 43.0–77.0)
Platelets: 220 10*3/uL (ref 150.0–400.0)
RBC: 4.65 Mil/uL (ref 3.87–5.11)
RDW: 13.4 % (ref 11.5–15.5)
WBC: 6.3 10*3/uL (ref 4.0–10.5)

## 2019-11-19 LAB — URINALYSIS W MICROSCOPIC + REFLEX CULTURE
Bacteria, UA: NONE SEEN /HPF
Bilirubin Urine: NEGATIVE
Glucose, UA: NEGATIVE
Hgb urine dipstick: NEGATIVE
Hyaline Cast: NONE SEEN /LPF
Ketones, ur: NEGATIVE
Leukocyte Esterase: NEGATIVE
Nitrites, Initial: NEGATIVE
Protein, ur: NEGATIVE
RBC / HPF: NONE SEEN /HPF (ref 0–2)
Specific Gravity, Urine: 1.01 (ref 1.001–1.03)
Squamous Epithelial / LPF: NONE SEEN /HPF (ref <=5)
WBC, UA: NONE SEEN /HPF (ref 0–5)
pH: 5.5 (ref 5.0–8.0)

## 2019-11-19 LAB — LDL CHOLESTEROL, DIRECT: Direct LDL: 106 mg/dL

## 2019-11-19 LAB — BASIC METABOLIC PANEL
BUN: 22 mg/dL (ref 6–23)
CO2: 29 mEq/L (ref 19–32)
Calcium: 9.8 mg/dL (ref 8.4–10.5)
Chloride: 103 mEq/L (ref 96–112)
Creatinine, Ser: 1.61 mg/dL — ABNORMAL HIGH (ref 0.40–1.20)
GFR: 31.79 mL/min — ABNORMAL LOW (ref >60.00)
Glucose, Bld: 103 mg/dL — ABNORMAL HIGH (ref 70–99)
Potassium: 4.2 mEq/L (ref 3.5–5.1)
Sodium: 139 mEq/L (ref 135–145)

## 2019-11-19 LAB — COMPREHENSIVE METABOLIC PANEL
ALT: 16 U/L (ref 0–35)
AST: 18 U/L (ref 0–37)
Albumin: 4.4 g/dL (ref 3.5–5.2)
Alkaline Phosphatase: 44 U/L (ref 39–117)
BUN: 22 mg/dL (ref 6–23)
CO2: 29 mEq/L (ref 19–32)
Calcium: 9.8 mg/dL (ref 8.4–10.5)
Chloride: 103 mEq/L (ref 96–112)
Creatinine, Ser: 1.61 mg/dL — ABNORMAL HIGH (ref 0.40–1.20)
GFR: 31.79 mL/min — ABNORMAL LOW (ref >60.00)
Glucose, Bld: 103 mg/dL — ABNORMAL HIGH (ref 70–99)
Potassium: 4.2 mEq/L (ref 3.5–5.1)
Sodium: 139 mEq/L (ref 135–145)
Total Bilirubin: 1 mg/dL (ref 0.2–1.2)
Total Protein: 6.6 g/dL (ref 6.0–8.3)

## 2019-11-19 LAB — LIPID PANEL
Cholesterol: 207 mg/dL — ABNORMAL HIGH (ref 0–200)
HDL: 61.1 mg/dL (ref >39.00)
NonHDL: 146.16
Total CHOL/HDL Ratio: 3
Triglycerides: 248 mg/dL — ABNORMAL HIGH (ref 0.0–149.0)
VLDL: 49.6 mg/dL — ABNORMAL HIGH (ref 0.0–40.0)

## 2019-11-19 LAB — HEMOGLOBIN A1C: Hgb A1c MFr Bld: 5.8 % (ref 4.6–6.5)

## 2019-11-19 LAB — TSH: TSH: 0.84 u[IU]/mL (ref 0.35–4.50)

## 2019-11-19 LAB — NO CULTURE INDICATED

## 2019-11-21 ENCOUNTER — Encounter: Payer: Self-pay | Admitting: Family Medicine

## 2019-11-21 DIAGNOSIS — R351 Nocturia: Secondary | ICD-10-CM | POA: Insufficient documentation

## 2019-11-21 DIAGNOSIS — M545 Low back pain, unspecified: Secondary | ICD-10-CM | POA: Insufficient documentation

## 2019-11-21 DIAGNOSIS — M8580 Other specified disorders of bone density and structure, unspecified site: Secondary | ICD-10-CM | POA: Insufficient documentation

## 2019-11-21 DIAGNOSIS — E669 Obesity, unspecified: Secondary | ICD-10-CM | POA: Insufficient documentation

## 2019-11-21 DIAGNOSIS — N1832 Chronic kidney disease, stage 3b: Secondary | ICD-10-CM | POA: Insufficient documentation

## 2019-12-05 ENCOUNTER — Encounter: Payer: Self-pay | Admitting: Family Medicine

## 2020-01-26 ENCOUNTER — Ambulatory Visit: Payer: Medicare HMO | Attending: Internal Medicine

## 2020-01-26 DIAGNOSIS — Z23 Encounter for immunization: Secondary | ICD-10-CM | POA: Insufficient documentation

## 2020-01-26 NOTE — Progress Notes (Signed)
   Covid-19 Vaccination Clinic  Name:  Kathryn Lynn    MRN: [id number] DOB: Apr 13, 1951  01/26/2020  [name] was observed post Covid-19 immunization for 15 minutes without incidence. She was provided with Vaccine Information Sheet and instruction to access the V-Safe system.   [name] was instructed to call 911 with any severe reactions post vaccine: Marland Kitchen Difficulty breathing  . Swelling of your face and throat  . A fast heartbeat  . A bad rash all over your body  . Dizziness and weakness    Immunizations Administered    Name Date Dose VIS Date Route   Pfizer COVID-19 Vaccine 01/26/2020  1:20 PM 0.3 mL 11/15/2019 Intramuscular   Manufacturer: [employer], [employer]   Lot: [id number]   NDC: [id number]

## 2020-02-19 ENCOUNTER — Ambulatory Visit: Payer: Medicare HMO | Attending: Internal Medicine

## 2020-02-19 DIAGNOSIS — Z23 Encounter for immunization: Secondary | ICD-10-CM

## 2020-02-19 NOTE — Progress Notes (Signed)
   Covid-19 Vaccination Clinic  Name:  Kathryn Lynn    MRN: [id number] DOB: 09-20-1951  02/19/2020  Kathryn Lynn was observed post Covid-19 immunization for 15 minutes without incident. She was provided with Vaccine Information Sheet and instruction to access the V-Safe system.   Kathryn Lynn was instructed to call 911 with any severe reactions post vaccine: Marland Kitchen Difficulty breathing  . Swelling of face and throat  . A fast heartbeat  . A bad rash all over body  . Dizziness and weakness   Immunizations Administered    Name Date Dose VIS Date Route   Pfizer COVID-19 Vaccine 02/19/2020 12:37 PM 0.3 mL 11/15/2019 Intramuscular   Manufacturer: [employer], [employer]   Lot: [id number]   NDC: [address]-2

## 2020-04-05 LAB — FECAL OCCULT BLOOD, IMMUNOCHEMICAL: IFOBT: NEGATIVE

## 2020-04-21 ENCOUNTER — Other Ambulatory Visit: Payer: Self-pay

## 2020-04-21 ENCOUNTER — Ambulatory Visit (INDEPENDENT_AMBULATORY_CARE_PROVIDER_SITE_OTHER): Payer: Medicare HMO | Admitting: Family Medicine

## 2020-04-21 VITALS — BP 118/79 | HR 74 | Temp 98.0°F | Resp 18 | Ht 62.0 in | Wt 187.4 lb

## 2020-04-21 DIAGNOSIS — M791 Myalgia, unspecified site: Secondary | ICD-10-CM | POA: Insufficient documentation

## 2020-04-21 DIAGNOSIS — R0683 Snoring: Secondary | ICD-10-CM | POA: Insufficient documentation

## 2020-04-21 DIAGNOSIS — E669 Obesity, unspecified: Secondary | ICD-10-CM

## 2020-04-21 DIAGNOSIS — R400 Somnolence: Secondary | ICD-10-CM | POA: Insufficient documentation

## 2020-04-21 DIAGNOSIS — Z1211 Encounter for screening for malignant neoplasm of colon: Secondary | ICD-10-CM | POA: Diagnosis not present

## 2020-04-21 DIAGNOSIS — R531 Weakness: Secondary | ICD-10-CM | POA: Diagnosis not present

## 2020-04-21 DIAGNOSIS — N1832 Chronic kidney disease, stage 3b: Secondary | ICD-10-CM

## 2020-04-21 NOTE — Patient Instructions (Signed)
We will call you with lab results.  I have also referred you to pulmonology for work up on sleep apnea.   They will receive a cologuard bucket for colon cancer screen.   Fatigue If you have fatigue, you feel tired all the time and have a lack of energy or a lack of motivation. Fatigue may make it difficult to start or complete tasks because of exhaustion. In general, occasional or mild fatigue is often a normal response to activity or life. However, long-lasting (chronic) or extreme fatigue may be a symptom of a medical condition. Follow these instructions at home: General instructions  Watch your fatigue for any changes.  Go to bed and get up at the same time every day.  Avoid fatigue by pacing yourself during the day and getting enough sleep at night.  Maintain a healthy weight. Medicines  Take over-the-counter and prescription medicines only as told by your health care provider.  Take a multivitamin, if told by your health care provider.  Do not use herbal or dietary supplements unless they are approved by your health care provider. Activity   Exercise regularly, as told by your health care provider.  Use or practice techniques to help you relax, such as yoga, tai chi, meditation, or massage therapy. Eating and drinking   Avoid heavy meals in the evening.  Eat a well-balanced diet, which includes lean proteins, whole grains, plenty of fruits and vegetables, and low-fat dairy products.  Avoid consuming too much caffeine.  Avoid the use of alcohol.  Drink enough fluid to keep your urine pale yellow. Lifestyle  Change situations that cause you stress. Try to keep your work and personal schedule in balance.  Do not use any products that contain nicotine or tobacco, such as cigarettes and e-cigarettes. If you need help quitting, ask your health care provider.  Do not use drugs. Contact a health care provider if:  Your fatigue does not get better.  You have a  fever.  You suddenly lose or gain weight.  You have headaches.  You have trouble falling asleep or sleeping through the night.  You feel angry, guilty, anxious, or sad.  You are unable to have a bowel movement (constipation).  Your skin is dry.  You have swelling in your legs or another part of your body. Get help right away if:  You feel confused.  Your vision is blurry.  You feel faint or you pass out.  You have a severe headache.  You have severe pain in your abdomen, your back, or the area between your waist and hips (pelvis).  You have chest pain, shortness of breath, or an irregular or fast heartbeat.  You are unable to urinate, or you urinate less than normal.  You have abnormal bleeding, such as bleeding from the rectum, vagina, nose, lungs, or nipples.  You vomit blood.  You have thoughts about hurting yourself or others. If you ever feel like you may hurt yourself or others, or have thoughts about taking your own life, get help right away. You can go to your nearest emergency department or call:  Your local emergency services (911 in the [place]  A suicide crisis helpline, such as the [facility] at 646-193-1150. This is open 24 hours a day. Summary  If you have fatigue, you feel tired all the time and have a lack of energy or a lack of motivation.  Fatigue may make it difficult to start or complete tasks because of exhaustion.  Long-lasting (chronic) or extreme fatigue may be a symptom of a medical condition.  Exercise regularly, as told by your health care provider.  Change situations that cause you stress. Try to keep your work and personal schedule in balance. This information is not intended to replace advice given to you by your health care provider. Make sure you discuss any questions you have with your health care provider. Document Revised: 06/12/2019 Document Reviewed: 08/16/2017 Elsevier Patient Education  2020  [employer].
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
## 2020-04-22 LAB — IRON,TIBC AND FERRITIN PANEL
%SAT: 33 % (calc) (ref 16–45)
Ferritin: 16 ng/mL (ref 16–288)
Iron: 110 ug/dL (ref 45–160)
TIBC: 332 mcg/dL (calc) (ref 250–450)

## 2020-04-22 LAB — CBC WITH DIFFERENTIAL/PLATELET
Absolute Monocytes: 342 cells/uL (ref 200–950)
Basophils Absolute: 28 cells/uL (ref 0–200)
Basophils Relative: 0.5 %
Eosinophils Absolute: 123 cells/uL (ref 15–500)
Eosinophils Relative: 2.2 %
HCT: 42.8 % (ref 35.0–45.0)
Hemoglobin: 13.9 g/dL (ref 11.7–15.5)
Lymphs Abs: 2481 cells/uL (ref 850–3900)
MCH: 28.9 pg (ref 27.0–33.0)
MCHC: 32.5 g/dL (ref 32.0–36.0)
MCV: 89 fL (ref 80.0–100.0)
MPV: 10.2 fL (ref 7.5–12.5)
Monocytes Relative: 6.1 %
Neutro Abs: 2626 cells/uL (ref 1500–7800)
Neutrophils Relative %: 46.9 %
Platelets: 215 10*3/uL (ref 140–400)
RBC: 4.81 10*6/uL (ref 3.80–5.10)
RDW: 13.1 % (ref 11.0–15.0)
Total Lymphocyte: 44.3 %
WBC: 5.6 10*3/uL (ref 3.8–10.8)

## 2020-04-22 LAB — EXTRA SPECIMEN

## 2020-04-22 LAB — RENAL FUNCTION PANEL
Albumin: 4.3 g/dL (ref 3.6–5.1)
BUN/Creatinine Ratio: 12 (calc) (ref 6–22)
BUN: 19 mg/dL (ref 7–25)
CO2: 25 mmol/L (ref 20–32)
Calcium: 9.5 mg/dL (ref 8.6–10.4)
Chloride: 105 mmol/L (ref 98–110)
Creat: 1.55 mg/dL — ABNORMAL HIGH (ref 0.50–0.99)
Glucose, Bld: 89 mg/dL (ref 65–99)
Phosphorus: 4.2 mg/dL (ref 2.1–4.3)
Potassium: 4.7 mmol/L (ref 3.5–5.3)
Sodium: 140 mmol/L (ref 135–146)

## 2020-04-22 LAB — T4, FREE: Free T4: 1.3 ng/dL (ref 0.8–1.8)

## 2020-04-22 LAB — VITAMIN B12: Vitamin B-12: 325 pg/mL (ref 200–1100)

## 2020-04-22 LAB — VITAMIN D 25 HYDROXY (VIT D DEFICIENCY, FRACTURES): Vit D, 25-Hydroxy: 71 ng/mL (ref 30–100)

## 2020-04-22 LAB — PTH, INTACT AND CALCIUM
Calcium: 9.5 mg/dL (ref 8.6–10.4)
PTH: 24 pg/mL (ref 14–64)

## 2020-04-22 LAB — TSH: TSH: 1.21 mIU/L (ref 0.40–4.50)

## 2020-04-23 ENCOUNTER — Telehealth: Payer: Self-pay | Admitting: Family Medicine

## 2020-04-23 ENCOUNTER — Encounter: Payer: Self-pay | Admitting: Family Medicine

## 2020-04-23 DIAGNOSIS — R42 Dizziness and giddiness: Secondary | ICD-10-CM

## 2020-04-23 DIAGNOSIS — R5383 Other fatigue: Secondary | ICD-10-CM

## 2020-04-23 MED ORDER — CYANOCOBALAMIN 3000 MCG/ML SL LIQD: 1500.0000 ug | Freq: Every day | SUBLINGUAL | 5 refills | Status: DC

## 2020-04-23 NOTE — Telephone Encounter (Signed)
Pt was called and given all lab results/instructions. Appt for lab was scheduled and pt said she already had PCP appt June 16th and would like to just keep that instead of making another appt.

## 2020-04-23 NOTE — Telephone Encounter (Signed)
Please inform patient the following information: Her blood cell counts and electrolytes are normal. Thyroid and parathyroid functions are normal. Iron levels are normal. Her vitamin D level is 71.  This is the upper limits of normal.  Usually at significantly higher levels of vitamin D (90s to 100s) people can become weak feeling.  Its not the likely cause of her symptoms with a level of 71, but she could decrease her dose just by taking her vitamin D only  6 days a week.  Her B12 levels are low at 325.  People can become fatigued and complain of achiness when levels start to decrease past 400.  I have called in a daily vitamin B 12 supplementation that is a sublingual form so it is better absorbed than the pills.  If it is not covered by her insurance she can purchase over-the-counter B12 sublingual liquid 1000-1570mcg.  Kidney function is stable, minimally improved prior GFR 31, now 34.      -As far as her Zyrtec dose for kidney function: Manufacture recommendations no dose changes needed for a GFR/kidney function greater than 31.  However since she is right on the borderline of those recommendations and she is having fatigue I would suggest she take a half a tab nightly for her allergies/sinus.   Laboratory results do not indicate obvious cause for her symptoms. Recommendations: - decreasing the vitamin D dose slightly and starting the B12 daily. -Hydrate-Ensure taking in 80 ounces of water daily.> Urine should be almost clear slightly yellow when well hydrated. -Rule out sleep apnea as potential cause-pulmonology referral had been placed for her. -Lastly, I would like her to have a lab appointment to go ahead and test her urine and liver function as other potential causes of her fatigue. We will call her with those results.  Provider appt 2 weeks- sooner if symptoms worsening. please schedule appt for both provider and lab

## 2020-04-28 ENCOUNTER — Ambulatory Visit (INDEPENDENT_AMBULATORY_CARE_PROVIDER_SITE_OTHER): Payer: Medicare HMO | Admitting: Family Medicine

## 2020-04-28 ENCOUNTER — Other Ambulatory Visit: Payer: Self-pay

## 2020-04-28 DIAGNOSIS — R42 Dizziness and giddiness: Secondary | ICD-10-CM | POA: Diagnosis not present

## 2020-04-28 DIAGNOSIS — R5383 Other fatigue: Secondary | ICD-10-CM

## 2020-04-29 ENCOUNTER — Other Ambulatory Visit: Payer: Self-pay | Admitting: Family Medicine

## 2020-04-29 LAB — HEPATIC FUNCTION PANEL
AG Ratio: 2 (calc) (ref 1.0–2.5)
ALT: 13 U/L (ref 6–29)
AST: 17 U/L (ref 10–35)
Albumin: 4.3 g/dL (ref 3.6–5.1)
Alkaline phosphatase (APISO): 42 U/L (ref 37–153)
Bilirubin, Direct: 0.2 mg/dL (ref 0.0–0.2)
Globulin: 2.1 g/dL (calc) (ref 1.9–3.7)
Indirect Bilirubin: 0.8 mg/dL (calc) (ref 0.2–1.2)
Total Bilirubin: 1 mg/dL (ref 0.2–1.2)
Total Protein: 6.4 g/dL (ref 6.1–8.1)

## 2020-04-29 LAB — URINALYSIS W MICROSCOPIC + REFLEX CULTURE
Bacteria, UA: NONE SEEN /HPF
Bilirubin Urine: NEGATIVE
Glucose, UA: NEGATIVE
Hgb urine dipstick: NEGATIVE
Hyaline Cast: NONE SEEN /LPF
Ketones, ur: NEGATIVE
Leukocyte Esterase: NEGATIVE
Nitrites, Initial: NEGATIVE
Protein, ur: NEGATIVE
RBC / HPF: NONE SEEN /HPF (ref 0–2)
Specific Gravity, Urine: 1.013 (ref 1.001–1.03)
Squamous Epithelial / HPF: NONE SEEN /HPF (ref <=5)
WBC, UA: NONE SEEN /HPF (ref 0–5)
pH: 6 (ref 5.0–8.0)

## 2020-04-29 LAB — NO CULTURE INDICATED

## 2020-05-05 LAB — COLOGUARD

## 2020-05-12 DIAGNOSIS — Z1211 Encounter for screening for malignant neoplasm of colon: Secondary | ICD-10-CM | POA: Diagnosis not present

## 2020-05-16 LAB — COLOGUARD
COLOGUARD: NEGATIVE
Cologuard: NEGATIVE

## 2020-05-19 DIAGNOSIS — M19071 Primary osteoarthritis, right ankle and foot: Secondary | ICD-10-CM | POA: Diagnosis not present

## 2020-05-19 DIAGNOSIS — M779 Enthesopathy, unspecified: Secondary | ICD-10-CM | POA: Diagnosis not present

## 2020-05-19 DIAGNOSIS — M79671 Pain in right foot: Secondary | ICD-10-CM | POA: Diagnosis not present

## 2020-05-20 ENCOUNTER — Ambulatory Visit (INDEPENDENT_AMBULATORY_CARE_PROVIDER_SITE_OTHER): Payer: Medicare HMO | Admitting: Family Medicine

## 2020-05-20 ENCOUNTER — Encounter: Payer: Self-pay | Admitting: Family Medicine

## 2020-05-20 ENCOUNTER — Other Ambulatory Visit: Payer: Self-pay

## 2020-05-20 VITALS — BP 117/74 | HR 75 | Temp 97.9°F | Resp 18 | Ht 62.0 in | Wt 189.1 lb

## 2020-05-20 DIAGNOSIS — N1832 Chronic kidney disease, stage 3b: Secondary | ICD-10-CM

## 2020-05-20 DIAGNOSIS — E669 Obesity, unspecified: Secondary | ICD-10-CM | POA: Diagnosis not present

## 2020-05-20 DIAGNOSIS — I10 Essential (primary) hypertension: Secondary | ICD-10-CM

## 2020-05-20 DIAGNOSIS — M9979 Connective tissue and disc stenosis of intervertebral foramina of abdomen and other regions: Secondary | ICD-10-CM | POA: Diagnosis not present

## 2020-05-20 DIAGNOSIS — R531 Weakness: Secondary | ICD-10-CM | POA: Diagnosis not present

## 2020-05-20 DIAGNOSIS — M791 Myalgia, unspecified site: Secondary | ICD-10-CM | POA: Diagnosis not present

## 2020-05-20 DIAGNOSIS — Z1231 Encounter for screening mammogram for malignant neoplasm of breast: Secondary | ICD-10-CM

## 2020-05-20 DIAGNOSIS — M519 Unspecified thoracic, thoracolumbar and lumbosacral intervertebral disc disorder: Secondary | ICD-10-CM

## 2020-05-20 DIAGNOSIS — M5416 Radiculopathy, lumbar region: Secondary | ICD-10-CM

## 2020-05-20 DIAGNOSIS — E782 Mixed hyperlipidemia: Secondary | ICD-10-CM | POA: Diagnosis not present

## 2020-05-20 DIAGNOSIS — R5383 Other fatigue: Secondary | ICD-10-CM

## 2020-05-20 DIAGNOSIS — R350 Frequency of micturition: Secondary | ICD-10-CM | POA: Diagnosis not present

## 2020-05-20 MED ORDER — CYCLOBENZAPRINE HCL 5 MG PO TABS
5.0000 mg | ORAL_TABLET | Freq: Every day | ORAL | 1 refills | Status: DC
Start: 2020-05-20 — End: 2020-11-20

## 2020-05-20 MED ORDER — LISINOPRIL 20 MG PO TABS: 20.0000 mg | ORAL_TABLET | Freq: Every day | ORAL | 1 refills | Status: DC

## 2020-05-20 MED ORDER — SIMVASTATIN 20 MG PO TABS: 20.0000 mg | ORAL_TABLET | Freq: Every day | ORAL | 3 refills | Status: DC

## 2020-05-20 MED ORDER — GABAPENTIN 300 MG PO CAPS
300.0000 mg | ORAL_CAPSULE | Freq: Two times a day (BID) | ORAL | 1 refills | Status: DC
Start: 2020-05-20 — End: 2020-11-20
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
## 2020-05-20 NOTE — Patient Instructions (Signed)
Continue to hydrate. I am glad the dizziness has resolved I have called in the gabapentin 300 mg to start every 12 hours (once of those times before bed). Flexeril, a muscle relaxer, called in to your local pharmacy. Start 1/2-1 tab before bed for 1-2 weeks, then only use as needed after that.   If your fatigue is still present after getting your sleep pattern and pain better controlled and you hydrating> then please return before your chronic appt date in Dec.   They will call you about pulm referral.

## 2020-05-21 ENCOUNTER — Telehealth: Payer: Self-pay | Admitting: Family Medicine

## 2020-05-21 ENCOUNTER — Encounter: Payer: Self-pay | Admitting: Family Medicine

## 2020-05-21 DIAGNOSIS — N1832 Chronic kidney disease, stage 3b: Secondary | ICD-10-CM

## 2020-05-21 DIAGNOSIS — M545 Low back pain, unspecified: Secondary | ICD-10-CM

## 2020-05-21 DIAGNOSIS — R5383 Other fatigue: Secondary | ICD-10-CM

## 2020-05-21 DIAGNOSIS — R351 Nocturia: Secondary | ICD-10-CM

## 2020-05-21 DIAGNOSIS — M5416 Radiculopathy, lumbar region: Secondary | ICD-10-CM | POA: Insufficient documentation

## 2020-05-21 NOTE — Telephone Encounter (Signed)
Please inform patient When documenting her office visit from the other day and reviewing her chart in detail including prior imaging studies and labs, I believe we need to look into her decreased kidney function more. I never did receive records that would provide answers to why her kidney function decreased drastically.  Now that she is presenting with the fatigue and the continued nocturia we need to rule out possible reasons for her kidney disease. I have ordered a kidney ultrasound-they will call her to schedule this is at [facility] I have ordered an additional blood test that look further into causes of kidney disease and fatigue and  a 24-hour urine test. Please explain the 24-hour urine test to her and she will need to be supplied with container.   We will call her with all the results once they are received and if there are any abnormal results> I will have her come in for appointment so that we can discuss in detail.

## 2020-05-22 NOTE — Telephone Encounter (Signed)
Pt was called and given all information and instructions. She was scheduled for a lab visit Monday and will pick up 24 hr urine/instructions at that time.

## 2020-05-25 ENCOUNTER — Ambulatory Visit (INDEPENDENT_AMBULATORY_CARE_PROVIDER_SITE_OTHER): Payer: Medicare HMO | Admitting: Family Medicine

## 2020-05-25 ENCOUNTER — Other Ambulatory Visit: Payer: Self-pay

## 2020-05-25 DIAGNOSIS — M545 Low back pain, unspecified: Secondary | ICD-10-CM

## 2020-05-25 DIAGNOSIS — R5383 Other fatigue: Secondary | ICD-10-CM

## 2020-05-25 DIAGNOSIS — N1832 Chronic kidney disease, stage 3b: Secondary | ICD-10-CM

## 2020-05-25 DIAGNOSIS — R351 Nocturia: Secondary | ICD-10-CM

## 2020-05-26 LAB — C3 AND C4
C3 Complement: 130 mg/dL (ref 83–193)
C4 Complement: 23 mg/dL (ref 15–57)

## 2020-05-26 LAB — ANCA SCREEN W REFLEX TITER: ANCA Screen: NEGATIVE

## 2020-05-28 DIAGNOSIS — R5383 Other fatigue: Secondary | ICD-10-CM | POA: Diagnosis not present

## 2020-05-28 DIAGNOSIS — R531 Weakness: Secondary | ICD-10-CM | POA: Diagnosis not present

## 2020-05-28 DIAGNOSIS — N1832 Chronic kidney disease, stage 3b: Secondary | ICD-10-CM | POA: Diagnosis not present

## 2020-05-28 DIAGNOSIS — M791 Myalgia, unspecified site: Secondary | ICD-10-CM | POA: Diagnosis not present

## 2020-05-28 DIAGNOSIS — R350 Frequency of micturition: Secondary | ICD-10-CM | POA: Diagnosis not present

## 2020-05-28 NOTE — Addendum Note (Signed)
Addended by: Dyann Kief on: 05/28/2020 11:05 AM   Modules accepted: Orders

## 2020-06-01 LAB — PROTEIN, TOTAL AND ELECTRO, 24 HR U
Creatinine, 24H Ur: 1.43 g/(24.h) (ref 0.50–2.15)
PROTEIN/CREATININE RATIO: 0.073 mg/mg creat (ref <=0.1)
PROTEIN/CREATININE RATIO: 73 mg/g creat (ref <=114)
Protein, 24H Urine: 104 mg/24 h (ref 0–149)

## 2020-06-02 ENCOUNTER — Telehealth: Payer: Self-pay

## 2020-06-02 NOTE — Telephone Encounter (Signed)
Patient called in to speak to [facility] about lab appt  Please call on Wednesday

## 2020-06-03 NOTE — Telephone Encounter (Signed)
LMOM for pt to CB.  Patient needs a lab visit at her earliest convenience.  Okay to work her in.

## 2020-06-07 DIAGNOSIS — S1086XA Insect bite of other specified part of neck, initial encounter: Secondary | ICD-10-CM | POA: Diagnosis not present

## 2020-06-07 DIAGNOSIS — L239 Allergic contact dermatitis, unspecified cause: Secondary | ICD-10-CM | POA: Diagnosis not present

## 2020-06-07 DIAGNOSIS — W57XXXA Bitten or stung by nonvenomous insect and other nonvenomous arthropods, initial encounter: Secondary | ICD-10-CM | POA: Diagnosis not present

## 2020-06-09 DIAGNOSIS — L03031 Cellulitis of right toe: Secondary | ICD-10-CM | POA: Diagnosis not present

## 2020-06-09 DIAGNOSIS — M79674 Pain in right toe(s): Secondary | ICD-10-CM | POA: Diagnosis not present

## 2020-06-12 ENCOUNTER — Ambulatory Visit (HOSPITAL_COMMUNITY)
Admission: RE | Admit: 2020-06-12 | Discharge: 2020-06-12 | Disposition: A | Discharge status: Home / Self Care | Payer: Medicare HMO | Source: Ambulatory Visit | Attending: Family Medicine | Admitting: Family Medicine

## 2020-06-12 ENCOUNTER — Ambulatory Visit: Payer: Medicare HMO

## 2020-06-12 ENCOUNTER — Other Ambulatory Visit: Payer: Self-pay

## 2020-06-12 DIAGNOSIS — R5383 Other fatigue: Secondary | ICD-10-CM | POA: Diagnosis not present

## 2020-06-12 DIAGNOSIS — K760 Fatty (change of) liver, not elsewhere classified: Secondary | ICD-10-CM | POA: Diagnosis not present

## 2020-06-12 DIAGNOSIS — I10 Essential (primary) hypertension: Secondary | ICD-10-CM | POA: Diagnosis not present

## 2020-06-12 DIAGNOSIS — R350 Frequency of micturition: Secondary | ICD-10-CM

## 2020-06-12 DIAGNOSIS — N183 Chronic kidney disease, stage 3 unspecified: Secondary | ICD-10-CM | POA: Diagnosis not present

## 2020-06-12 DIAGNOSIS — M791 Myalgia, unspecified site: Secondary | ICD-10-CM | POA: Diagnosis not present

## 2020-06-12 DIAGNOSIS — N1832 Chronic kidney disease, stage 3b: Secondary | ICD-10-CM | POA: Diagnosis not present

## 2020-06-12 DIAGNOSIS — R531 Weakness: Secondary | ICD-10-CM | POA: Diagnosis not present

## 2020-06-12 DIAGNOSIS — Q600 Renal agenesis, unilateral: Secondary | ICD-10-CM | POA: Diagnosis not present

## 2020-06-15 ENCOUNTER — Other Ambulatory Visit: Payer: Self-pay

## 2020-06-15 ENCOUNTER — Ambulatory Visit (INDEPENDENT_AMBULATORY_CARE_PROVIDER_SITE_OTHER): Payer: Medicare HMO | Admitting: Family Medicine

## 2020-06-15 DIAGNOSIS — R531 Weakness: Secondary | ICD-10-CM

## 2020-06-15 DIAGNOSIS — R350 Frequency of micturition: Secondary | ICD-10-CM | POA: Diagnosis not present

## 2020-06-15 DIAGNOSIS — N1832 Chronic kidney disease, stage 3b: Secondary | ICD-10-CM | POA: Diagnosis not present

## 2020-06-15 DIAGNOSIS — R5383 Other fatigue: Secondary | ICD-10-CM

## 2020-06-15 DIAGNOSIS — M791 Myalgia, unspecified site: Secondary | ICD-10-CM

## 2020-06-17 ENCOUNTER — Telehealth: Payer: Self-pay

## 2020-06-17 DIAGNOSIS — H5210 Myopia, unspecified eye: Secondary | ICD-10-CM | POA: Diagnosis not present

## 2020-06-17 DIAGNOSIS — H2513 Age-related nuclear cataract, bilateral: Secondary | ICD-10-CM | POA: Diagnosis not present

## 2020-06-17 DIAGNOSIS — I10 Essential (primary) hypertension: Secondary | ICD-10-CM | POA: Diagnosis not present

## 2020-06-17 DIAGNOSIS — E7800 Pure hypercholesterolemia, unspecified: Secondary | ICD-10-CM | POA: Diagnosis not present

## 2020-06-17 LAB — PROTEIN,TOTAL AND ELECTROPHOR W/IFE
Albumin ELP: 4.2 g/dL (ref 3.8–4.8)
Alpha 1: 0.3 g/dL (ref 0.2–0.3)
Alpha 2: 0.8 g/dL (ref 0.5–0.9)
Beta 2: 0.3 g/dL (ref 0.2–0.5)
Beta Globulin: 0.4 g/dL (ref 0.4–0.6)
Gamma Globulin: 0.8 g/dL (ref 0.8–1.7)
Immunofix Electr Int: NOT DETECTED
Total Protein: 6.7 g/dL (ref 6.1–8.1)

## 2020-06-17 NOTE — Telephone Encounter (Signed)
Patient calling about results from her Ultrasound last Friday 7/9  Please call patient [phone]

## 2020-06-18 ENCOUNTER — Telehealth: Payer: Self-pay | Admitting: Family Medicine

## 2020-06-18 DIAGNOSIS — N1832 Chronic kidney disease, stage 3b: Secondary | ICD-10-CM

## 2020-06-18 DIAGNOSIS — R5383 Other fatigue: Secondary | ICD-10-CM

## 2020-06-18 NOTE — Telephone Encounter (Signed)
See other note- pt given results

## 2020-06-18 NOTE — Telephone Encounter (Signed)
Pt was called and given information, she verbalized understanding  

## 2020-06-18 NOTE — Telephone Encounter (Signed)
Please inform patient: -Kidney ultrasound is normal. -SPEP/blood test is normal> with no M spike.  Both of these results are reassuring to rule out multiple myeloma or kidney lesions, however that does not give [place] any answers on why her kidney function declined as it had or explain her fatigue.  I believe she would benefit from nephrology referral which is a kidney specialist.  Patients with moderate chronic kidney disease typically follow with nephrology at least 1-2 times a year for close monitoring.  I have placed this referral for her today.

## 2020-06-25 DIAGNOSIS — B351 Tinea unguium: Secondary | ICD-10-CM | POA: Diagnosis not present

## 2020-07-07 ENCOUNTER — Other Ambulatory Visit: Payer: Self-pay

## 2020-07-07 ENCOUNTER — Ambulatory Visit: Payer: Medicare HMO | Admitting: Pulmonary Disease

## 2020-07-07 ENCOUNTER — Encounter: Payer: Self-pay | Admitting: Pulmonary Disease

## 2020-07-07 VITALS — BP 118/62 | HR 88 | Temp 97.9°F | Ht 62.0 in | Wt 186.6 lb

## 2020-07-07 DIAGNOSIS — G4733 Obstructive sleep apnea (adult) (pediatric): Secondary | ICD-10-CM

## 2020-07-07 DIAGNOSIS — R0683 Snoring: Secondary | ICD-10-CM

## 2020-07-07 NOTE — Assessment & Plan Note (Signed)
Given excessive daytime somnolence, narrow pharyngeal exam & loud snoring, obstructive sleep apnea is very likely & an overnight polysomnogram will be scheduled as a split study. The pathophysiology of obstructive sleep apnea , it's cardiovascular consequences & modes of treatment including CPAP were discused with the patient in detail & they evidenced understanding.  Pretest probability is intermediate.  No bed partner history is available today but red flags are increased somnolence, morning headaches, waking herself up by her snoring. She is amenable to using her CPAP, nasal interface may suffice
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
## 2020-07-07 NOTE — Patient Instructions (Signed)
  Schedule home sleep test We discussed treatment options 

## 2020-07-07 NOTE — Addendum Note (Signed)
Addended by: Melonie [place] on: 07/07/2020 11:59 AM   Modules accepted: Orders

## 2020-07-14 DIAGNOSIS — H52209 Unspecified astigmatism, unspecified eye: Secondary | ICD-10-CM | POA: Diagnosis not present

## 2020-07-14 DIAGNOSIS — H5213 Myopia, bilateral: Secondary | ICD-10-CM | POA: Diagnosis not present

## 2020-07-14 DIAGNOSIS — H524 Presbyopia: Secondary | ICD-10-CM | POA: Diagnosis not present

## 2020-07-14 DIAGNOSIS — Z0100 Encounter for examination of eyes and vision without abnormal findings: Secondary | ICD-10-CM | POA: Diagnosis not present

## 2020-07-18 DIAGNOSIS — T148XXA Other injury of unspecified body region, initial encounter: Secondary | ICD-10-CM | POA: Diagnosis not present

## 2020-07-22 ENCOUNTER — Other Ambulatory Visit: Payer: Self-pay

## 2020-07-22 ENCOUNTER — Ambulatory Visit: Payer: Medicare HMO

## 2020-07-22 DIAGNOSIS — G4733 Obstructive sleep apnea (adult) (pediatric): Secondary | ICD-10-CM

## 2020-08-03 ENCOUNTER — Telehealth: Payer: Self-pay | Admitting: Pulmonary Disease

## 2020-08-03 DIAGNOSIS — G4733 Obstructive sleep apnea (adult) (pediatric): Secondary | ICD-10-CM | POA: Diagnosis not present

## 2020-08-03 NOTE — Telephone Encounter (Signed)
HST showed very mild  OSA with AHI 9/ hr  Events were only noted in the supine position Suggest - avoid sleeping supine if possible CPAP not necessary at this time. OV in 75months with APP  to reassess symptoms, can consider CPAP if worse

## 2020-08-03 NOTE — Telephone Encounter (Signed)
Pt was informed of home sleep study results will make a f/u appt in [place] office

## 2020-08-07 DIAGNOSIS — N1832 Chronic kidney disease, stage 3b: Secondary | ICD-10-CM | POA: Diagnosis not present

## 2020-08-07 DIAGNOSIS — E6609 Other obesity due to excess calories: Secondary | ICD-10-CM | POA: Diagnosis not present

## 2020-08-07 DIAGNOSIS — K760 Fatty (change of) liver, not elsewhere classified: Secondary | ICD-10-CM | POA: Diagnosis not present

## 2020-08-07 DIAGNOSIS — Z79899 Other long term (current) drug therapy: Secondary | ICD-10-CM | POA: Diagnosis not present

## 2020-08-07 DIAGNOSIS — I129 Hypertensive chronic kidney disease with stage 1 through stage 4 chronic kidney disease, or unspecified chronic kidney disease: Secondary | ICD-10-CM | POA: Diagnosis not present

## 2020-09-04 DIAGNOSIS — I129 Hypertensive chronic kidney disease with stage 1 through stage 4 chronic kidney disease, or unspecified chronic kidney disease: Secondary | ICD-10-CM | POA: Diagnosis not present

## 2020-09-04 DIAGNOSIS — K760 Fatty (change of) liver, not elsewhere classified: Secondary | ICD-10-CM | POA: Diagnosis not present

## 2020-09-04 DIAGNOSIS — Z79899 Other long term (current) drug therapy: Secondary | ICD-10-CM | POA: Diagnosis not present

## 2020-09-04 DIAGNOSIS — E6609 Other obesity due to excess calories: Secondary | ICD-10-CM | POA: Diagnosis not present

## 2020-09-23 DIAGNOSIS — K760 Fatty (change of) liver, not elsewhere classified: Secondary | ICD-10-CM | POA: Diagnosis not present

## 2020-09-23 DIAGNOSIS — E6609 Other obesity due to excess calories: Secondary | ICD-10-CM | POA: Diagnosis not present

## 2020-09-23 DIAGNOSIS — N1832 Chronic kidney disease, stage 3b: Secondary | ICD-10-CM | POA: Diagnosis not present

## 2020-09-23 DIAGNOSIS — I129 Hypertensive chronic kidney disease with stage 1 through stage 4 chronic kidney disease, or unspecified chronic kidney disease: Secondary | ICD-10-CM | POA: Diagnosis not present

## 2020-10-15 ENCOUNTER — Ambulatory Visit (HOSPITAL_COMMUNITY): Payer: Medicare HMO

## 2020-10-23 ENCOUNTER — Ambulatory Visit (HOSPITAL_COMMUNITY): Payer: Medicare HMO

## 2020-11-18 ENCOUNTER — Other Ambulatory Visit: Payer: Self-pay | Admitting: Family Medicine

## 2020-11-20 ENCOUNTER — Encounter: Payer: Self-pay | Admitting: Family Medicine

## 2020-11-20 ENCOUNTER — Other Ambulatory Visit: Payer: Self-pay

## 2020-11-20 ENCOUNTER — Ambulatory Visit (INDEPENDENT_AMBULATORY_CARE_PROVIDER_SITE_OTHER): Payer: Medicare HMO | Admitting: Family Medicine

## 2020-11-20 VITALS — BP 113/63 | HR 82 | Temp 97.5°F | Resp 18 | Ht 62.0 in | Wt 172.4 lb

## 2020-11-20 DIAGNOSIS — M1712 Unilateral primary osteoarthritis, left knee: Secondary | ICD-10-CM | POA: Diagnosis not present

## 2020-11-20 DIAGNOSIS — E669 Obesity, unspecified: Secondary | ICD-10-CM

## 2020-11-20 DIAGNOSIS — Z0001 Encounter for general adult medical examination with abnormal findings: Secondary | ICD-10-CM | POA: Diagnosis not present

## 2020-11-20 DIAGNOSIS — M8580 Other specified disorders of bone density and structure, unspecified site: Secondary | ICD-10-CM | POA: Diagnosis not present

## 2020-11-20 DIAGNOSIS — E782 Mixed hyperlipidemia: Secondary | ICD-10-CM | POA: Diagnosis not present

## 2020-11-20 DIAGNOSIS — M545 Low back pain, unspecified: Secondary | ICD-10-CM

## 2020-11-20 DIAGNOSIS — Z131 Encounter for screening for diabetes mellitus: Secondary | ICD-10-CM | POA: Diagnosis not present

## 2020-11-20 DIAGNOSIS — N1832 Chronic kidney disease, stage 3b: Secondary | ICD-10-CM

## 2020-11-20 DIAGNOSIS — Z23 Encounter for immunization: Secondary | ICD-10-CM | POA: Diagnosis not present

## 2020-11-20 DIAGNOSIS — I10 Essential (primary) hypertension: Secondary | ICD-10-CM

## 2020-11-20 DIAGNOSIS — M5416 Radiculopathy, lumbar region: Secondary | ICD-10-CM

## 2020-11-20 LAB — COMPREHENSIVE METABOLIC PANEL
ALT: 14 U/L (ref 0–35)
AST: 15 U/L (ref 0–37)
Albumin: 4.1 g/dL (ref 3.5–5.2)
Alkaline Phosphatase: 40 U/L (ref 39–117)
BUN: 20 mg/dL (ref 6–23)
CO2: 31 mEq/L (ref 19–32)
Calcium: 9.3 mg/dL (ref 8.4–10.5)
Chloride: 105 mEq/L (ref 96–112)
Creatinine, Ser: 1.8 mg/dL — ABNORMAL HIGH (ref 0.40–1.20)
GFR: 28.41 mL/min — ABNORMAL LOW (ref >60.00)
Glucose, Bld: 86 mg/dL (ref 70–99)
Potassium: 4.5 mEq/L (ref 3.5–5.1)
Sodium: 142 mEq/L (ref 135–145)
Total Bilirubin: 1 mg/dL (ref 0.2–1.2)
Total Protein: 6.3 g/dL (ref 6.0–8.3)

## 2020-11-20 LAB — LIPID PANEL
Cholesterol: 159 mg/dL (ref 0–200)
HDL: 60.5 mg/dL (ref >39.00)
LDL Cholesterol: 79 mg/dL (ref 0–99)
NonHDL: 98.33
Total CHOL/HDL Ratio: 3
Triglycerides: 98 mg/dL (ref 0.0–149.0)
VLDL: 19.6 mg/dL (ref 0.0–40.0)

## 2020-11-20 LAB — CBC WITH DIFFERENTIAL/PLATELET
Basophils Absolute: 0 10*3/uL (ref 0.0–0.1)
Basophils Relative: 0.7 % (ref 0.0–3.0)
Eosinophils Absolute: 0.1 10*3/uL (ref 0.0–0.7)
Eosinophils Relative: 2.7 % (ref 0.0–5.0)
HCT: 36.6 % (ref 36.0–46.0)
Hemoglobin: 12 g/dL (ref 12.0–15.0)
Lymphocytes Relative: 48.3 % — ABNORMAL HIGH (ref 12.0–46.0)
Lymphs Abs: 2.3 10*3/uL (ref 0.7–4.0)
MCHC: 32.8 g/dL (ref 30.0–36.0)
MCV: 86.3 fl (ref 78.0–100.0)
Monocytes Absolute: 0.4 10*3/uL (ref 0.1–1.0)
Monocytes Relative: 8.5 % (ref 3.0–12.0)
Neutro Abs: 1.9 10*3/uL (ref 1.4–7.7)
Neutrophils Relative %: 39.8 % — ABNORMAL LOW (ref 43.0–77.0)
Platelets: 177 10*3/uL (ref 150.0–400.0)
RBC: 4.24 Mil/uL (ref 3.87–5.11)
RDW: 13.6 % (ref 11.5–15.5)
WBC: 4.7 10*3/uL (ref 4.0–10.5)

## 2020-11-20 LAB — TSH: TSH: 1.81 u[IU]/mL (ref 0.35–4.50)

## 2020-11-20 LAB — HEMOGLOBIN A1C: Hgb A1c MFr Bld: 5.8 % (ref 4.6–6.5)

## 2020-11-20 MED ORDER — DICLOFENAC SODIUM 1 % EX GEL: 2.0000 g | Freq: Four times a day (QID) | CUTANEOUS | 3 refills | Status: DC

## 2020-11-20 MED ORDER — GABAPENTIN 300 MG PO CAPS: 300.0000 mg | ORAL_CAPSULE | Freq: Two times a day (BID) | ORAL | 1 refills | Status: DC

## 2020-11-20 MED ORDER — GABAPENTIN 300 MG PO CAPS
300.0000 mg | ORAL_CAPSULE | Freq: Two times a day (BID) | ORAL | 1 refills | Status: DC
Start: 2020-11-20 — End: 2020-11-20

## 2020-11-20 MED ORDER — LISINOPRIL 20 MG PO TABS: 20.0000 mg | ORAL_TABLET | Freq: Every day | ORAL | 1 refills | Status: DC

## 2020-11-20 MED ORDER — LISINOPRIL 20 MG PO TABS
20.0000 mg | ORAL_TABLET | Freq: Every day | ORAL | 1 refills | Status: DC
Start: 2020-11-20 — End: 2020-11-20

## 2020-11-20 MED ORDER — SIMVASTATIN 20 MG PO TABS: 20.0000 mg | ORAL_TABLET | Freq: Every day | ORAL | 3 refills | Status: DC

## 2020-11-20 MED ORDER — SIMVASTATIN 20 MG PO TABS
20.0000 mg | ORAL_TABLET | Freq: Every day | ORAL | 3 refills | Status: DC
Start: 2020-11-20 — End: 2020-11-20

## 2020-11-20 MED ORDER — CYCLOBENZAPRINE HCL 5 MG PO TABS: 5.0000 mg | ORAL_TABLET | Freq: Three times a day (TID) | ORAL | 1 refills | Status: DC | PRN

## 2020-11-20 MED ORDER — CYCLOBENZAPRINE HCL 5 MG PO TABS
5.0000 mg | ORAL_TABLET | Freq: Three times a day (TID) | ORAL | 1 refills | Status: DC | PRN
Start: 2020-11-20 — End: 2020-11-20

## 2020-11-20 MED ORDER — TETANUS-DIPHTH-ACELL PERTUSSIS 5-2.5-18.5 LF-MCG/0.5 IM SUSP: 0.5000 mL | Freq: Once | INTRAMUSCULAR | 0 refills | Status: AC

## 2020-11-20 MED ORDER — ZOSTER VAC RECOMB ADJUVANTED 50 MCG/0.5ML IM SUSR: 0.5000 mL | Freq: Once | INTRAMUSCULAR | 1 refills | Status: AC

## 2020-11-20 NOTE — Patient Instructions (Signed)
Health Maintenance After Age 69 After age 69, you are at a higher risk for certain long-term diseases and infections as well as injuries from falls. Falls are a major cause of broken bones and head injuries in people who are older than age 69. Getting regular preventive care can help to keep you healthy and well. Preventive care includes getting regular testing and making lifestyle changes as recommended by your health care provider. Talk with your health care provider about:  Which screenings and tests you should have. A screening is a test that checks for a disease when you have no symptoms.  A diet and exercise plan that is right for you. What should I know about screenings and tests to prevent falls? Screening and testing are the best ways to find a health problem early. Early diagnosis and treatment give you the best chance of managing medical conditions that are common after age 69. Certain conditions and lifestyle choices may make you more likely to have a fall. Your health care provider may recommend:  Regular vision checks. Poor vision and conditions such as cataracts can make you more likely to have a fall. If you wear glasses, make sure to get your prescription updated if your vision changes.  Medicine review. Work with your health care provider to regularly review all of the medicines you are taking, including over-the-counter medicines. Ask your health care provider about any side effects that may make you more likely to have a fall. Tell your health care provider if any medicines that you take make you feel dizzy or sleepy.  Osteoporosis screening. Osteoporosis is a condition that causes the bones to get weaker. This can make the bones weak and cause them to break more easily.  Blood pressure screening. Blood pressure changes and medicines to control blood pressure can make you feel dizzy.  Strength and balance checks. Your health care provider may recommend certain tests to check your  strength and balance while standing, walking, or changing positions.  Foot health exam. Foot pain and numbness, as well as not wearing proper footwear, can make you more likely to have a fall.  Depression screening. You may be more likely to have a fall if you have a fear of falling, feel emotionally low, or feel unable to do activities that you used to do.  Alcohol use screening. Using too much alcohol can affect your balance and may make you more likely to have a fall. What actions can I take to lower my risk of falls? General instructions  Talk with your health care provider about your risks for falling. Tell your health care provider if: ? You fall. Be sure to tell your health care provider about all falls, even ones that seem minor. ? You feel dizzy, sleepy, or off-balance.  Take over-the-counter and prescription medicines only as told by your health care provider. These include any supplements.  Eat a healthy diet and maintain a healthy weight. A healthy diet includes low-fat dairy products, low-fat (lean) meats, and fiber from whole grains, beans, and lots of fruits and vegetables. Home safety  Remove any tripping hazards, such as rugs, cords, and clutter.  Install safety equipment such as grab bars in bathrooms and safety rails on stairs.  Keep rooms and walkways well-lit. Activity   Follow a regular exercise program to stay fit. This will help you maintain your balance. Ask your health care provider what types of exercise are appropriate for you.  If you need a cane or   walker, use it as recommended by your health care provider.  Wear supportive shoes that have nonskid soles. Lifestyle  Do not drink alcohol if your health care provider tells you not to drink.  If you drink alcohol, limit how much you have: ? 0-1 drink a day for women. ? 0-2 drinks a day for men.  Be aware of how much alcohol is in your drink. In the [place] one drink equals one typical bottle of beer (12  oz), one-half glass of wine (5 oz), or one shot of hard liquor (1 oz).  Do not use any products that contain nicotine or tobacco, such as cigarettes and e-cigarettes. If you need help quitting, ask your health care provider. Summary  Having a healthy lifestyle and getting preventive care can help to protect your health and wellness after age 69.  Screening and testing are the best way to find a health problem early and help you avoid having a fall. Early diagnosis and treatment give you the best chance for managing medical conditions that are more common for people who are older than age 69.  Falls are a major cause of broken bones and head injuries in people who are older than age 69. Take precautions to prevent a fall at home.  Work with your health care provider to learn what changes you can make to improve your health and wellness and to prevent falls. This information is not intended to replace advice given to you by your health care provider. Make sure you discuss any questions you have with your health care provider. Document Revised: 03/14/2019 Document Reviewed: 10/04/2017 Elsevier Patient Education  2020 [employer]  
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
## 2020-11-23 ENCOUNTER — Ambulatory Visit (HOSPITAL_COMMUNITY)
Admission: RE | Admit: 2020-11-23 | Discharge: 2020-11-23 | Disposition: A | Discharge status: Home / Self Care | Payer: Medicare HMO | Source: Ambulatory Visit | Attending: Family Medicine | Admitting: Family Medicine

## 2020-11-23 ENCOUNTER — Other Ambulatory Visit: Payer: Self-pay

## 2020-11-23 DIAGNOSIS — Z1231 Encounter for screening mammogram for malignant neoplasm of breast: Secondary | ICD-10-CM

## 2020-12-03 ENCOUNTER — Ambulatory Visit (INDEPENDENT_AMBULATORY_CARE_PROVIDER_SITE_OTHER): Payer: Medicare HMO | Admitting: Orthopaedic Surgery

## 2020-12-03 ENCOUNTER — Other Ambulatory Visit: Payer: Self-pay

## 2020-12-03 ENCOUNTER — Ambulatory Visit (INDEPENDENT_AMBULATORY_CARE_PROVIDER_SITE_OTHER): Payer: Medicare HMO

## 2020-12-03 ENCOUNTER — Encounter: Payer: Self-pay | Admitting: Orthopaedic Surgery

## 2020-12-03 VITALS — Ht 62.0 in | Wt 170.0 lb

## 2020-12-03 DIAGNOSIS — M25562 Pain in left knee: Secondary | ICD-10-CM | POA: Diagnosis not present

## 2020-12-03 DIAGNOSIS — G8929 Other chronic pain: Secondary | ICD-10-CM
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
## 2020-12-17 DIAGNOSIS — E6609 Other obesity due to excess calories: Secondary | ICD-10-CM | POA: Diagnosis not present

## 2020-12-17 DIAGNOSIS — N1832 Chronic kidney disease, stage 3b: Secondary | ICD-10-CM | POA: Diagnosis not present

## 2020-12-17 DIAGNOSIS — K760 Fatty (change of) liver, not elsewhere classified: Secondary | ICD-10-CM | POA: Diagnosis not present

## 2020-12-17 DIAGNOSIS — I129 Hypertensive chronic kidney disease with stage 1 through stage 4 chronic kidney disease, or unspecified chronic kidney disease: Secondary | ICD-10-CM | POA: Diagnosis not present

## 2020-12-25 DIAGNOSIS — I129 Hypertensive chronic kidney disease with stage 1 through stage 4 chronic kidney disease, or unspecified chronic kidney disease: Secondary | ICD-10-CM | POA: Diagnosis not present

## 2020-12-25 DIAGNOSIS — N1832 Chronic kidney disease, stage 3b: Secondary | ICD-10-CM | POA: Diagnosis not present

## 2020-12-25 DIAGNOSIS — K760 Fatty (change of) liver, not elsewhere classified: Secondary | ICD-10-CM | POA: Diagnosis not present

## 2020-12-25 DIAGNOSIS — E6609 Other obesity due to excess calories: Secondary | ICD-10-CM | POA: Diagnosis not present

## 2021-01-27 ENCOUNTER — Telehealth: Payer: Self-pay | Admitting: Family Medicine

## 2021-01-27 NOTE — Telephone Encounter (Signed)
Spoke with patient to schedule AWVI she stated to call back on 01/28/21

## 2021-03-10 ENCOUNTER — Ambulatory Visit (INDEPENDENT_AMBULATORY_CARE_PROVIDER_SITE_OTHER): Payer: Medicare HMO

## 2021-03-10 VITALS — Ht 62.0 in | Wt 161.0 lb

## 2021-03-10 DIAGNOSIS — Z0000 Encounter for general adult medical examination without abnormal findings: Secondary | ICD-10-CM | POA: Diagnosis not present

## 2021-03-10 DIAGNOSIS — Z780 Asymptomatic menopausal state: Secondary | ICD-10-CM | POA: Diagnosis not present
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
## 2021-03-10 NOTE — Patient Instructions (Signed)
Kathryn Lynn , Thank you for taking time to complete your Medicare Wellness Visit. I appreciate your ongoing commitment to your health goals. Please review the following plan we discussed and let me know if I can assist you in the future.   Screening recommendations/referrals: Colonoscopy: Cologuard completed 6//07/2020-Due 05/13/2023 Mammogram: Completed 12/02/2020-Due 12/02/2021 Bone Density: Ordered today. Someone will be calling you to schedule. Recommended yearly ophthalmology/optometry visit for glaucoma screening and checkup Recommended yearly dental visit for hygiene and checkup  Vaccinations: Influenza vaccine: Up to date Pneumococcal vaccine: Completed vaccines Tdap vaccine: Discuss with pharmacy Shingles vaccine: Discuss with pharmacy Covid-19:Completed vaccines  Advanced directives: Please bring a copy for your chart  Conditions/risks identified: See problem list  Next appointment: Follow up in one year for your annual wellness visit    Preventive Care 65 Years and Older, Female Preventive care refers to lifestyle choices and visits with your health care provider that can promote health and wellness. What does preventive care include?  A yearly physical exam. This is also called an annual well check.  Dental exams once or twice a year.  Routine eye exams. Ask your health care provider how often you should have your eyes checked.  Personal lifestyle choices, including:  Daily care of your teeth and gums.  Regular physical activity.  Eating a healthy diet.  Avoiding tobacco and drug use.  Limiting alcohol use.  Practicing safe sex.  Taking low-dose aspirin every day.  Taking vitamin and mineral supplements as recommended by your health care provider. What happens during an annual well check? The services and screenings done by your health care provider during your annual well check will depend on your age, overall health, lifestyle risk factors, and family  history of disease. Counseling  Your health care provider may ask you questions about your:  Alcohol use.  Tobacco use.  Drug use.  Emotional well-being.  Home and relationship well-being.  Sexual activity.  Eating habits.  History of falls.  Memory and ability to understand (cognition).  Work and work Astronomer.  Reproductive health. Screening  You may have the following tests or measurements:  Height, weight, and BMI.  Blood pressure.  Lipid and cholesterol levels. These may be checked every 5 years, or more frequently if you are over 70 years old.  Skin check.  Lung cancer screening. You may have this screening every year starting at age 70 if you have a 30-pack-year history of smoking and currently smoke or have quit within the past 15 years.  Fecal occult blood test (FOBT) of the stool. You may have this test every year starting at age 70.  Flexible sigmoidoscopy or colonoscopy. You may have a sigmoidoscopy every 5 years or a colonoscopy every 10 years starting at age 70.  Hepatitis C blood test.  Hepatitis B blood test.  Sexually transmitted disease (STD) testing.  Diabetes screening. This is done by checking your blood sugar (glucose) after you have not eaten for a while (fasting). You may have this done every 1-3 years.  Bone density scan. This is done to screen for osteoporosis. You may have this done starting at age 70.  Mammogram. This may be done every 1-2 years. Talk to your health care provider about how often you should have regular mammograms. Talk with your health care provider about your test results, treatment options, and if necessary, the need for more tests. Vaccines  Your health care provider may recommend certain vaccines, such as:  Influenza vaccine. This is recommended  every year.  Tetanus, diphtheria, and acellular pertussis (Tdap, Td) vaccine. You may need a Td booster every 10 years.  Zoster vaccine. You may need this after  age 47.  Pneumococcal 13-valent conjugate (PCV13) vaccine. One dose is recommended after age 70.  Pneumococcal polysaccharide (PPSV23) vaccine. One dose is recommended after age 70. Talk to your health care provider about which screenings and vaccines you need and how often you need them. This information is not intended to replace advice given to you by your health care provider. Make sure you discuss any questions you have with your health care provider. Document Released: 12/18/2015 Document Revised: 08/10/2016 Document Reviewed: 09/22/2015 Elsevier Interactive Patient Education  2017 [employer].  Fall Prevention in the Home Falls can cause injuries. They can happen to people of all ages. There are many things you can do to make your home safe and to help prevent falls. What can I do on the outside of my home?  Regularly fix the edges of walkways and driveways and fix any cracks.  Remove anything that might make you trip as you walk through a door, such as a raised step or threshold.  Trim any bushes or trees on the path to your home.  Use bright outdoor lighting.  Clear any walking paths of anything that might make someone trip, such as rocks or tools.  Regularly check to see if handrails are loose or broken. Make sure that both sides of any steps have handrails.  Any raised decks and porches should have guardrails on the edges.  Have any leaves, snow, or ice cleared regularly.  Use sand or salt on walking paths during winter.  Clean up any spills in your garage right away. This includes oil or grease spills. What can I do in the bathroom?  Use night lights.  Install grab bars by the toilet and in the tub and shower. Do not use towel bars as grab bars.  Use non-skid mats or decals in the tub or shower.  If you need to sit down in the shower, use a plastic, non-slip stool.  Keep the floor dry. Clean up any water that spills on the floor as soon as it  happens.  Remove soap buildup in the tub or shower regularly.  Attach bath mats securely with double-sided non-slip rug tape.  Do not have throw rugs and other things on the floor that can make you trip. What can I do in the bedroom?  Use night lights.  Make sure that you have a light by your bed that is easy to reach.  Do not use any sheets or blankets that are too big for your bed. They should not hang down onto the floor.  Have a firm chair that has side arms. You can use this for support while you get dressed.  Do not have throw rugs and other things on the floor that can make you trip. What can I do in the kitchen?  Clean up any spills right away.  Avoid walking on wet floors.  Keep items that you use a lot in easy-to-reach places.  If you need to reach something above you, use a strong step stool that has a grab bar.  Keep electrical cords out of the way.  Do not use floor polish or wax that makes floors slippery. If you must use wax, use non-skid floor wax.  Do not have throw rugs and other things on the floor that can make you trip. What  can I do with my stairs?  Do not leave any items on the stairs.  Make sure that there are handrails on both sides of the stairs and use them. Fix handrails that are broken or loose. Make sure that handrails are as long as the stairways.  Check any carpeting to make sure that it is firmly attached to the stairs. Fix any carpet that is loose or worn.  Avoid having throw rugs at the top or bottom of the stairs. If you do have throw rugs, attach them to the floor with carpet tape.  Make sure that you have a light switch at the top of the stairs and the bottom of the stairs. If you do not have them, ask someone to add them for you. What else can I do to help prevent falls?  Wear shoes that:  Do not have high heels.  Have rubber bottoms.  Are comfortable and fit you well.  Are closed at the toe. Do not wear sandals.  If you  use a stepladder:  Make sure that it is fully opened. Do not climb a closed stepladder.  Make sure that both sides of the stepladder are locked into place.  Ask someone to hold it for you, if possible.  Clearly mark and make sure that you can see:  Any grab bars or handrails.  First and last steps.  Where the edge of each step is.  Use tools that help you move around (mobility aids) if they are needed. These include:  Canes.  Walkers.  Scooters.  Crutches.  Turn on the lights when you go into a dark area. Replace any light bulbs as soon as they burn out.  Set up your furniture so you have a clear path. Avoid moving your furniture around.  If any of your floors are uneven, fix them.  If there are any pets around you, be aware of where they are.  Review your medicines with your doctor. Some medicines can make you feel dizzy. This can increase your chance of falling. Ask your doctor what other things that you can do to help prevent falls. This information is not intended to replace advice given to you by your health care provider. Make sure you discuss any questions you have with your health care provider. Document Released: 09/17/2009 Document Revised: 04/28/2016 Document Reviewed: 12/26/2014 Elsevier Interactive Patient Education  2017 [employer].

## 2021-03-17 DIAGNOSIS — E6609 Other obesity due to excess calories: Secondary | ICD-10-CM | POA: Diagnosis not present

## 2021-03-17 DIAGNOSIS — K760 Fatty (change of) liver, not elsewhere classified: Secondary | ICD-10-CM | POA: Diagnosis not present

## 2021-03-17 DIAGNOSIS — I129 Hypertensive chronic kidney disease with stage 1 through stage 4 chronic kidney disease, or unspecified chronic kidney disease: Secondary | ICD-10-CM | POA: Diagnosis not present

## 2021-03-17 DIAGNOSIS — N1832 Chronic kidney disease, stage 3b: Secondary | ICD-10-CM | POA: Diagnosis not present

## 2021-03-26 DIAGNOSIS — I129 Hypertensive chronic kidney disease with stage 1 through stage 4 chronic kidney disease, or unspecified chronic kidney disease: Secondary | ICD-10-CM | POA: Diagnosis not present

## 2021-03-26 DIAGNOSIS — D638 Anemia in other chronic diseases classified elsewhere: Secondary | ICD-10-CM | POA: Diagnosis not present

## 2021-03-26 DIAGNOSIS — E6609 Other obesity due to excess calories: Secondary | ICD-10-CM | POA: Diagnosis not present

## 2021-03-26 DIAGNOSIS — N1832 Chronic kidney disease, stage 3b: Secondary | ICD-10-CM | POA: Diagnosis not present

## 2021-03-26 DIAGNOSIS — E211 Secondary hyperparathyroidism, not elsewhere classified: Secondary | ICD-10-CM | POA: Diagnosis not present

## 2021-04-06 ENCOUNTER — Encounter: Payer: Self-pay | Admitting: Internal Medicine

## 2021-04-08 DIAGNOSIS — D519 Vitamin B12 deficiency anemia, unspecified: Secondary | ICD-10-CM | POA: Diagnosis not present

## 2021-04-08 DIAGNOSIS — D638 Anemia in other chronic diseases classified elsewhere: Secondary | ICD-10-CM | POA: Diagnosis not present

## 2021-04-08 DIAGNOSIS — I129 Hypertensive chronic kidney disease with stage 1 through stage 4 chronic kidney disease, or unspecified chronic kidney disease: Secondary | ICD-10-CM | POA: Diagnosis not present

## 2021-04-08 DIAGNOSIS — N1832 Chronic kidney disease, stage 3b: Secondary | ICD-10-CM | POA: Diagnosis not present

## 2021-04-08 DIAGNOSIS — E6609 Other obesity due to excess calories: Secondary | ICD-10-CM | POA: Diagnosis not present

## 2021-04-16 DIAGNOSIS — Z6829 Body mass index (BMI) 29.0-29.9, adult: Secondary | ICD-10-CM | POA: Diagnosis not present

## 2021-04-16 DIAGNOSIS — I129 Hypertensive chronic kidney disease with stage 1 through stage 4 chronic kidney disease, or unspecified chronic kidney disease: Secondary | ICD-10-CM | POA: Diagnosis not present

## 2021-04-16 DIAGNOSIS — D508 Other iron deficiency anemias: Secondary | ICD-10-CM | POA: Diagnosis not present

## 2021-04-16 DIAGNOSIS — N1832 Chronic kidney disease, stage 3b: Secondary | ICD-10-CM | POA: Diagnosis not present

## 2021-04-16 DIAGNOSIS — D638 Anemia in other chronic diseases classified elsewhere: Secondary | ICD-10-CM | POA: Diagnosis not present

## 2021-04-16 DIAGNOSIS — L239 Allergic contact dermatitis, unspecified cause: Secondary | ICD-10-CM | POA: Diagnosis not present

## 2021-04-19 ENCOUNTER — Other Ambulatory Visit: Payer: Self-pay | Admitting: Family Medicine

## 2021-04-26 ENCOUNTER — Other Ambulatory Visit: Payer: Self-pay | Admitting: Family Medicine

## 2021-05-07 ENCOUNTER — Other Ambulatory Visit: Payer: Self-pay

## 2021-05-07 ENCOUNTER — Encounter: Payer: Self-pay | Admitting: Family Medicine

## 2021-05-07 ENCOUNTER — Ambulatory Visit (INDEPENDENT_AMBULATORY_CARE_PROVIDER_SITE_OTHER): Payer: Medicare HMO | Admitting: Family Medicine

## 2021-05-07 VITALS — BP 93/57 | HR 78 | Temp 98.0°F | Ht 62.0 in | Wt 161.0 lb

## 2021-05-07 DIAGNOSIS — M9979 Connective tissue and disc stenosis of intervertebral foramina of abdomen and other regions: Secondary | ICD-10-CM | POA: Diagnosis not present

## 2021-05-07 DIAGNOSIS — N1832 Chronic kidney disease, stage 3b: Secondary | ICD-10-CM | POA: Diagnosis not present

## 2021-05-07 DIAGNOSIS — E669 Obesity, unspecified: Secondary | ICD-10-CM

## 2021-05-07 DIAGNOSIS — E782 Mixed hyperlipidemia: Secondary | ICD-10-CM

## 2021-05-07 DIAGNOSIS — M5416 Radiculopathy, lumbar region: Secondary | ICD-10-CM

## 2021-05-07 DIAGNOSIS — M545 Low back pain, unspecified: Secondary | ICD-10-CM

## 2021-05-07 DIAGNOSIS — I10 Essential (primary) hypertension: Secondary | ICD-10-CM | POA: Diagnosis not present

## 2021-05-07 DIAGNOSIS — M519 Unspecified thoracic, thoracolumbar and lumbosacral intervertebral disc disorder: Secondary | ICD-10-CM

## 2021-05-07 MED ORDER — GABAPENTIN 300 MG PO CAPS: 1.0000 | ORAL_CAPSULE | Freq: Two times a day (BID) | ORAL | 1 refills | Status: DC

## 2021-05-07 MED ORDER — CYCLOBENZAPRINE HCL 5 MG PO TABS: 5.0000 mg | ORAL_TABLET | Freq: Three times a day (TID) | ORAL | 5 refills | Status: DC | PRN

## 2021-05-07 MED ORDER — LISINOPRIL 20 MG PO TABS: 1.0000 | ORAL_TABLET | Freq: Every day | ORAL | 1 refills | Status: DC

## 2021-05-07 MED ORDER — SIMVASTATIN 20 MG PO TABS
20.0000 mg | ORAL_TABLET | Freq: Every day | ORAL | 3 refills | Status: DC
Start: 2021-05-07 — End: 2021-11-19

## 2021-05-07 NOTE — Patient Instructions (Addendum)
Voltaren gel> call in and let us know how many grams are in your Voltaren tubes. We will write  refill for you in the amount you use, so you are not needing to pay so much out of pocket each time.     Great to see you today.  I have refilled the medication(s) we provide.   Next appt 5.5 mos.
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
## 2021-05-10 ENCOUNTER — Other Ambulatory Visit: Payer: Self-pay

## 2021-05-10 MED ORDER — DICLOFENAC SODIUM 1 % EX GEL: 4.0000 g | Freq: Four times a day (QID) | CUTANEOUS | 3 refills | Status: AC

## 2021-05-10 NOTE — Telephone Encounter (Signed)
Pt called to let us know that her voltaren OTC gel is 100 mg. Pt states that she was told to call so she could get Rx for it. Rx pending

## 2021-05-18 ENCOUNTER — Encounter (HOSPITAL_COMMUNITY): Payer: Self-pay

## 2021-05-18 ENCOUNTER — Encounter (HOSPITAL_COMMUNITY)
Admission: RE | Admit: 2021-05-18 | Discharge: 2021-05-18 | Disposition: A | Discharge status: Home / Self Care | Payer: Medicare HMO | Source: Ambulatory Visit | Attending: Nephrology | Admitting: Nephrology

## 2021-05-18 ENCOUNTER — Other Ambulatory Visit: Payer: Self-pay

## 2021-05-18 DIAGNOSIS — D509 Iron deficiency anemia, unspecified: Secondary | ICD-10-CM | POA: Diagnosis not present

## 2021-05-18 MED ORDER — SODIUM CHLORIDE 0.9 % IV SOLN
200.0000 mg | Freq: Once | INTRAVENOUS | Status: AC
  Administered 2021-05-18: 200 mg via INTRAVENOUS
  Filled 2021-05-18: qty 10

## 2021-05-18 MED ORDER — SODIUM CHLORIDE 0.9 % IV SOLN
INTRAVENOUS | Status: DC
  Administered 2021-05-18: 250 mL via INTRAVENOUS

## 2021-05-19 ENCOUNTER — Encounter (HOSPITAL_COMMUNITY)
Admission: RE | Admit: 2021-05-19 | Discharge: 2021-05-19 | Disposition: A | Discharge status: Home / Self Care | Payer: Medicare HMO | Source: Ambulatory Visit | Attending: Nephrology | Admitting: Nephrology

## 2021-05-19 DIAGNOSIS — D509 Iron deficiency anemia, unspecified: Secondary | ICD-10-CM | POA: Diagnosis not present

## 2021-05-19 MED ORDER — SODIUM CHLORIDE 0.9 % IV SOLN: Freq: Once | INTRAVENOUS | Status: AC

## 2021-05-19 MED ORDER — SODIUM CHLORIDE 0.9 % IV SOLN
200.0000 mg | Freq: Once | INTRAVENOUS | Status: AC
  Administered 2021-05-19: 200 mg via INTRAVENOUS
  Filled 2021-05-19: qty 10

## 2021-05-20 ENCOUNTER — Encounter (HOSPITAL_COMMUNITY)
Admission: RE | Admit: 2021-05-20 | Discharge: 2021-05-20 | Disposition: A | Discharge status: Home / Self Care | Payer: Medicare HMO | Source: Ambulatory Visit | Attending: Nephrology | Admitting: Nephrology

## 2021-05-20 ENCOUNTER — Encounter (HOSPITAL_COMMUNITY): Payer: Self-pay

## 2021-05-20 ENCOUNTER — Other Ambulatory Visit: Payer: Self-pay

## 2021-05-20 DIAGNOSIS — D509 Iron deficiency anemia, unspecified: Secondary | ICD-10-CM | POA: Diagnosis not present

## 2021-05-20 MED ORDER — SODIUM CHLORIDE 0.9 % IV SOLN: Freq: Once | INTRAVENOUS | Status: AC

## 2021-05-20 MED ORDER — SODIUM CHLORIDE 0.9 % IV SOLN
200.0000 mg | Freq: Once | INTRAVENOUS | Status: AC
  Administered 2021-05-20: 200 mg via INTRAVENOUS
  Filled 2021-05-20: qty 10

## 2021-05-21 ENCOUNTER — Ambulatory Visit: Payer: Medicare HMO | Admitting: Family Medicine

## 2021-05-25 ENCOUNTER — Encounter (HOSPITAL_COMMUNITY): Payer: Self-pay

## 2021-05-25 ENCOUNTER — Encounter (HOSPITAL_COMMUNITY)
Admission: RE | Admit: 2021-05-25 | Discharge: 2021-05-25 | Disposition: A | Discharge status: Home / Self Care | Payer: Medicare HMO | Source: Ambulatory Visit | Attending: Nephrology | Admitting: Nephrology

## 2021-05-25 ENCOUNTER — Other Ambulatory Visit: Payer: Self-pay

## 2021-05-25 DIAGNOSIS — D509 Iron deficiency anemia, unspecified: Secondary | ICD-10-CM | POA: Diagnosis not present

## 2021-05-25 MED ORDER — SODIUM CHLORIDE 0.9 % IV SOLN
200.0000 mg | Freq: Once | INTRAVENOUS | Status: AC
  Administered 2021-05-25: 200 mg via INTRAVENOUS
  Filled 2021-05-25: qty 10

## 2021-05-25 MED ORDER — SODIUM CHLORIDE 0.9 % IV SOLN: Freq: Once | INTRAVENOUS | Status: AC

## 2021-05-26 ENCOUNTER — Encounter (HOSPITAL_COMMUNITY): Payer: Self-pay

## 2021-05-26 ENCOUNTER — Encounter (HOSPITAL_COMMUNITY)
Admission: RE | Admit: 2021-05-26 | Discharge: 2021-05-26 | Disposition: A | Discharge status: Home / Self Care | Payer: Medicare HMO | Source: Ambulatory Visit | Attending: Nephrology | Admitting: Nephrology

## 2021-05-26 DIAGNOSIS — D509 Iron deficiency anemia, unspecified: Secondary | ICD-10-CM | POA: Diagnosis not present

## 2021-05-26 MED ORDER — SODIUM CHLORIDE 0.9 % IV SOLN
200.0000 mg | Freq: Once | INTRAVENOUS | Status: AC
  Administered 2021-05-26: 200 mg via INTRAVENOUS
  Filled 2021-05-26: qty 10

## 2021-05-26 MED ORDER — SODIUM CHLORIDE 0.9 % IV SOLN: INTRAVENOUS | Status: DC

## 2021-06-10 DIAGNOSIS — D508 Other iron deficiency anemias: Secondary | ICD-10-CM | POA: Diagnosis not present

## 2021-06-10 DIAGNOSIS — D638 Anemia in other chronic diseases classified elsewhere: Secondary | ICD-10-CM | POA: Diagnosis not present

## 2021-06-10 DIAGNOSIS — I129 Hypertensive chronic kidney disease with stage 1 through stage 4 chronic kidney disease, or unspecified chronic kidney disease: Secondary | ICD-10-CM | POA: Diagnosis not present

## 2021-06-10 DIAGNOSIS — Z6829 Body mass index (BMI) 29.0-29.9, adult: Secondary | ICD-10-CM | POA: Diagnosis not present

## 2021-06-10 DIAGNOSIS — N1832 Chronic kidney disease, stage 3b: Secondary | ICD-10-CM | POA: Diagnosis not present

## 2021-06-17 DIAGNOSIS — D508 Other iron deficiency anemias: Secondary | ICD-10-CM | POA: Diagnosis not present

## 2021-06-17 DIAGNOSIS — N1832 Chronic kidney disease, stage 3b: Secondary | ICD-10-CM | POA: Diagnosis not present

## 2021-06-17 DIAGNOSIS — I952 Hypotension due to drugs: Secondary | ICD-10-CM | POA: Diagnosis not present

## 2021-06-17 DIAGNOSIS — Z6829 Body mass index (BMI) 29.0-29.9, adult: Secondary | ICD-10-CM | POA: Diagnosis not present

## 2021-06-30 ENCOUNTER — Ambulatory Visit: Payer: Medicare HMO | Admitting: Gastroenterology

## 2021-06-30 ENCOUNTER — Encounter: Payer: Self-pay | Admitting: Gastroenterology

## 2021-06-30 ENCOUNTER — Other Ambulatory Visit: Payer: Self-pay

## 2021-06-30 ENCOUNTER — Encounter: Payer: Self-pay | Admitting: *Deleted

## 2021-06-30 ENCOUNTER — Telehealth: Payer: Self-pay | Admitting: *Deleted

## 2021-06-30 DIAGNOSIS — R1011 Right upper quadrant pain: Secondary | ICD-10-CM | POA: Diagnosis not present

## 2021-06-30 DIAGNOSIS — D509 Iron deficiency anemia, unspecified: Secondary | ICD-10-CM | POA: Insufficient documentation

## 2021-06-30 MED ORDER — PEG 3350-KCL-NA BICARB-NACL 420 G PO SOLR: ORAL | 0 refills | Status: DC
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
## 2021-06-30 NOTE — Telephone Encounter (Signed)
PA approved via [employer] for TCS/EGD. Auth# [id number], DOS Jul 13 2021 - Aug 12 2021

## 2021-06-30 NOTE — Patient Instructions (Signed)
Colonoscopy and upper endoscopy with Dr. [name]. Please see separate instructions.
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
## 2021-07-02 ENCOUNTER — Telehealth: Payer: Self-pay | Admitting: Internal Medicine

## 2021-07-02 NOTE — Telephone Encounter (Signed)
PATIENT CALLED ASKING IF DR CARVER WAS IN NETWORK WITH [employer], AND THEN SHE WANTED TO KNOW IF THE ANESTHESIOLOGIST THAT WOULD BE WORKING THAT DAY WAS IN HER NETWORK  SAID THAT IF HE WAS NOT SHE WOULD HAVE TO CANCEL HER PROCEDURE

## 2021-07-02 NOTE — Telephone Encounter (Signed)
Per stacey pt called back and to disregard message

## 2021-07-02 NOTE — Telephone Encounter (Signed)
error 

## 2021-07-13 ENCOUNTER — Encounter (HOSPITAL_COMMUNITY)
Admission: RE | Disposition: A | Discharge status: Home / Self Care | Payer: Self-pay | Source: Home / Self Care | Attending: Internal Medicine

## 2021-07-13 ENCOUNTER — Ambulatory Visit (HOSPITAL_COMMUNITY): Payer: Medicare HMO | Admitting: Anesthesiology

## 2021-07-13 ENCOUNTER — Other Ambulatory Visit: Payer: Self-pay

## 2021-07-13 ENCOUNTER — Encounter: Payer: Self-pay | Admitting: Internal Medicine

## 2021-07-13 ENCOUNTER — Encounter (HOSPITAL_COMMUNITY): Payer: Self-pay

## 2021-07-13 ENCOUNTER — Ambulatory Visit (HOSPITAL_COMMUNITY)
Admission: RE | Admit: 2021-07-13 | Discharge: 2021-07-13 | Disposition: A | Discharge status: Home / Self Care | Payer: Medicare HMO | Attending: Internal Medicine | Admitting: Internal Medicine

## 2021-07-13 DIAGNOSIS — Z833 Family history of diabetes mellitus: Secondary | ICD-10-CM | POA: Diagnosis not present

## 2021-07-13 DIAGNOSIS — Z8261 Family history of arthritis: Secondary | ICD-10-CM | POA: Diagnosis not present

## 2021-07-13 DIAGNOSIS — Z791 Long term (current) use of non-steroidal anti-inflammatories (NSAID): Secondary | ICD-10-CM | POA: Insufficient documentation

## 2021-07-13 DIAGNOSIS — N1832 Chronic kidney disease, stage 3b: Secondary | ICD-10-CM | POA: Diagnosis not present

## 2021-07-13 DIAGNOSIS — K2970 Gastritis, unspecified, without bleeding: Secondary | ICD-10-CM | POA: Insufficient documentation

## 2021-07-13 DIAGNOSIS — Z881 Allergy status to other antibiotic agents status: Secondary | ICD-10-CM | POA: Diagnosis not present

## 2021-07-13 DIAGNOSIS — Z8249 Family history of ischemic heart disease and other diseases of the circulatory system: Secondary | ICD-10-CM | POA: Insufficient documentation

## 2021-07-13 DIAGNOSIS — I129 Hypertensive chronic kidney disease with stage 1 through stage 4 chronic kidney disease, or unspecified chronic kidney disease: Secondary | ICD-10-CM | POA: Insufficient documentation

## 2021-07-13 DIAGNOSIS — Z8349 Family history of other endocrine, nutritional and metabolic diseases: Secondary | ICD-10-CM | POA: Insufficient documentation

## 2021-07-13 DIAGNOSIS — K5730 Diverticulosis of large intestine without perforation or abscess without bleeding: Secondary | ICD-10-CM | POA: Insufficient documentation

## 2021-07-13 DIAGNOSIS — D509 Iron deficiency anemia, unspecified: Secondary | ICD-10-CM | POA: Insufficient documentation

## 2021-07-13 DIAGNOSIS — K219 Gastro-esophageal reflux disease without esophagitis: Secondary | ICD-10-CM | POA: Insufficient documentation

## 2021-07-13 DIAGNOSIS — Z79899 Other long term (current) drug therapy: Secondary | ICD-10-CM | POA: Diagnosis not present

## 2021-07-13 DIAGNOSIS — Z823 Family history of stroke: Secondary | ICD-10-CM | POA: Diagnosis not present

## 2021-07-13 DIAGNOSIS — Z91040 Latex allergy status: Secondary | ICD-10-CM | POA: Insufficient documentation

## 2021-07-13 DIAGNOSIS — R1011 Right upper quadrant pain: Secondary | ICD-10-CM | POA: Diagnosis not present

## 2021-07-13 DIAGNOSIS — K648 Other hemorrhoids: Secondary | ICD-10-CM | POA: Diagnosis not present

## 2021-07-13 HISTORY — PX: BIOPSY: SHX5522

## 2021-07-13 HISTORY — PX: COLONOSCOPY WITH PROPOFOL: SHX5780

## 2021-07-13 HISTORY — PX: ESOPHAGOGASTRODUODENOSCOPY (EGD) WITH PROPOFOL: SHX5813

## 2021-07-13 SURGERY — COLONOSCOPY WITH PROPOFOL
Anesthesia: General

## 2021-07-13 MED ORDER — LIDOCAINE HCL (CARDIAC) PF 100 MG/5ML IV SOSY
PREFILLED_SYRINGE | INTRAVENOUS | Status: DC | PRN
  Administered 2021-07-13: 25 mg via INTRAVENOUS

## 2021-07-13 MED ORDER — PROPOFOL 10 MG/ML IV BOLUS
INTRAVENOUS | Status: DC | PRN
  Administered 2021-07-13: 50 mg via INTRAVENOUS
  Administered 2021-07-13: 100 mg via INTRAVENOUS

## 2021-07-13 MED ORDER — PHENYLEPHRINE HCL (PRESSORS) 10 MG/ML IV SOLN
INTRAVENOUS | Status: DC | PRN
  Administered 2021-07-13: 80 ug via INTRAVENOUS

## 2021-07-13 MED ORDER — LACTATED RINGERS IV SOLN: INTRAVENOUS | Status: DC

## 2021-07-13 MED ORDER — PROPOFOL 500 MG/50ML IV EMUL
INTRAVENOUS | Status: DC | PRN
  Administered 2021-07-13: 150 ug/kg/min via INTRAVENOUS

## 2021-07-13 NOTE — Anesthesia Preprocedure Evaluation (Addendum)
Anesthesia Evaluation  Patient identified by MRN, date of birth, ID band Patient awake    Reviewed: Allergy & Precautions, NPO status , Patient's Chart, lab work & pertinent test results  Airway Mallampati: III  TM Distance: >3 FB Neck ROM: Limited    Dental no notable dental hx.    Pulmonary neg pulmonary ROS,    Pulmonary exam normal breath sounds clear to auscultation       Cardiovascular hypertension, negative cardio ROS Normal cardiovascular exam Rhythm:Regular Rate:Normal     Neuro/Psych  Neuromuscular disease negative psych ROS   GI/Hepatic Neg liver ROS, GERD  ,  Endo/Other  negative endocrine ROS  Renal/GU Renal diseaseChronic kidney disease, stage 3b (  negative genitourinary   Musculoskeletal  (+) Arthritis , Osteoarthritis,    Abdominal   Peds negative pediatric ROS (+)  Hematology negative hematology ROS (+) anemia ,   Anesthesia Other Findings   Reproductive/Obstetrics negative OB ROS                            Anesthesia Physical Anesthesia Plan  ASA: 2  Anesthesia Plan: General   Post-op Pain Management:    Induction: Intravenous  PONV Risk Score and Plan: TIVA  Airway Management Planned: Nasal Cannula, Natural Airway and Simple Face Mask  Additional Equipment:   Intra-op Plan:   Post-operative Plan:   Informed Consent: I have reviewed the patients History and Physical, chart, labs and discussed the procedure including the risks, benefits and alternatives for the proposed anesthesia with the patient or authorized representative who has indicated his/her understanding and acceptance.       Plan Discussed with: CRNA  Anesthesia Plan Comments:         Anesthesia Quick Evaluation

## 2021-07-13 NOTE — Interval H&P Note (Signed)
History and Physical Interval Note:  07/13/2021 1:12 PM  Kathryn Lynn  has presented today for surgery, with the diagnosis of IDA, RUQ pain.  The various methods of treatment have been discussed with the patient and family. After consideration of risks, benefits and other options for treatment, the patient has consented to  Procedure(s) with comments: COLONOSCOPY WITH PROPOFOL (N/A) - 2:00pm ESOPHAGOGASTRODUODENOSCOPY (EGD) WITH PROPOFOL (N/A) as a surgical intervention.  The patient's history has been reviewed, patient examined, no change in status, stable for surgery.  I have reviewed the patient's chart and labs.  Questions were answered to the patient's satisfaction.     Lanelle Bal

## 2021-07-13 NOTE — Transfer of Care (Signed)
Immediate Anesthesia Transfer of Care Note  Patient: Kathryn Lynn  Procedure(s) Performed: COLONOSCOPY WITH PROPOFOL ESOPHAGOGASTRODUODENOSCOPY (EGD) WITH PROPOFOL BIOPSY  Patient Location: Endoscopy Unit  Anesthesia Type:General  Level of Consciousness: drowsy  Airway & Oxygen Therapy: Patient Spontanous Breathing  Post-op Assessment: Report given to RN and Post -op Vital signs reviewed and stable  Post vital signs: Reviewed and stable  Last Vitals:  Vitals Value Taken Time  BP    Temp    Pulse 67 07/13/21   1346  Resp 16 07/13/21  1346  SpO2 97 07/13/21  1346    Last Pain:  Vitals:   07/13/21 1320  TempSrc:   PainSc: 1       Patients Stated Pain Goal: 7 (07/13/21 1246)  Complications: No notable events documented.

## 2021-07-13 NOTE — Op Note (Signed)
[facility] Patient Name: Kathryn Lynn Procedure Date: 07/13/2021 1:30 PM MRN: [id number] Date of Birth: 1951/09/15 Attending MD: Elon Alas. Abbey Chatters DO CSN: [id number] Age: 70 Admit Type: Outpatient Procedure:                Colonoscopy Indications:              Iron deficiency anemia Providers:                Elon Alas. Abbey Chatters, DO, Gwenlyn Fudge, RN, Randa Spike, Technician Referring MD:              Medicines:                See the Anesthesia note for documentation of the                            administered medications Complications:            No immediate complications. Estimated Blood Loss:     Estimated blood loss: none. Procedure:                Pre-Anesthesia Assessment:                           - The anesthesia plan was to use monitored                            anesthesia care (MAC).                           After obtaining informed consent, the colonoscope                            was passed under direct vision. Throughout the                            procedure, the patient's blood pressure, pulse, and                            oxygen saturations were monitored continuously. The                            [id number]-HQ190L ([id number]) scope was introduced through                            the anus and advanced to the the cecum, identified                            by appendiceal orifice and ileocecal valve. The                            colonoscopy was performed without difficulty. The                            patient tolerated the procedure well. The quality  of the bowel preparation was evaluated using the                            BBPS [facility] Bowel Preparation Scale) with scores                            of: Right Colon = 3, Transverse Colon = 3 and Left                            Colon = 3 (entire mucosa seen well with no residual                            staining, small fragments of stool or  opaque                            liquid). The total BBPS score equals 9. Scope In: 1:31:25 PM Scope Out: 1:42:48 PM Scope Withdrawal Time: 0 hours 8 minutes 19 seconds  Total Procedure Duration: 0 hours 11 minutes 23 seconds  Findings:      The perianal and digital rectal examinations were normal.      Non-bleeding internal hemorrhoids were found during endoscopy.      The entire examined colon appeared normal.      Scattered small-mouthed diverticula were found in the sigmoid colon,       descending colon, transverse colon and ascending colon. Impression:               - No finding today to indicate occult GI blood loss                            as cause of iron deficiency anemia.                           - Non-bleeding internal hemorrhoids.                           - The entire examined colon is normal.                           - Diverticulosis in the sigmoid colon, in the                            descending colon, in the transverse colon and in                            the ascending colon.                           - No specimens collected. Moderate Sedation:      Per Anesthesia Care Recommendation:           - Patient has a contact number available for                            emergencies. The signs and symptoms of potential  delayed complications were discussed with the                            patient. Return to normal activities tomorrow.                            Written discharge instructions were provided to the                            patient.                           - Resume previous diet.                           - Continue present medications.                           - Repeat colonoscopy in 10 years for screening                            purposes.                           - Return to GI clinic in 4 months.                           - No finding today to indicate occult GI blood loss                            as cause  of iron deficiency anemia. Conside GIVENS                            capsule for completeness if Hgb drops below 10 Procedure Code(s):        --- Professional ---                           [phone], Colonoscopy, flexible; diagnostic, including                            collection of specimen(s) by brushing or washing,                            when performed (separate procedure) Diagnosis Code(s):        --- Professional ---                           K64.8, Other hemorrhoids                           D50.9, Iron deficiency anemia, unspecified                           K57.30, Diverticulosis of large intestine without  perforation or abscess without bleeding CPT copyright 2019 American Medical Association. All rights reserved. The codes documented in this report are preliminary and upon coder review may  be revised to meet current compliance requirements. Elon Alas. Abbey Chatters, DO [place] Abbey Chatters, DO 07/13/2021 1:47:00 PM This report has been signed electronically. Number of Addenda: 0

## 2021-07-13 NOTE — Op Note (Signed)
[facility] Patient Name: Kathryn Lynn Procedure Date: 07/13/2021 1:18 PM MRN: [id number] Date of Birth: 03-Apr-1951 Attending MD: Elon Alas. Abbey Chatters DO CSN: [id number] Age: 70 Admit Type: Outpatient Procedure:                Upper GI endoscopy Indications:              Iron deficiency anemia Providers:                Elon Alas. Abbey Chatters, DO, Gwenlyn Fudge, RN, Randa Spike, Technician Referring MD:              Medicines:                See the Anesthesia note for documentation of the                            administered medications Complications:            No immediate complications. Estimated Blood Loss:     Estimated blood loss was minimal. Procedure:                Pre-Anesthesia Assessment:                           - The anesthesia plan was to use monitored                            anesthesia care (MAC).                           After obtaining informed consent, the endoscope was                            passed under direct vision. Throughout the                            procedure, the patient's blood pressure, pulse, and                            oxygen saturations were monitored continuously. The                            [id number]-[id number] ([id number]) scope was introduced through the                            mouth, and advanced to the second part of duodenum.                            The upper GI endoscopy was accomplished without                            difficulty. The patient tolerated the procedure                            well. Scope In: 1:24:45 PM  Scope Out: 1:27:52 PM Total Procedure Duration: 0 hours 3 minutes 7 seconds  Findings:      There is no endoscopic evidence of bleeding, areas of erosion,       esophagitis, hiatal hernia, ulcerations or varices in the entire       esophagus.      Localized mild inflammation characterized by erythema was found in the       gastric antrum. Biopsies were taken with a cold forceps for  Helicobacter       pylori testing.      The duodenal bulb, first portion of the duodenum and second portion of       the duodenum were normal. Impression:               - Gastritis. Biopsied.                           - Normal duodenal bulb, first portion of the                            duodenum and second portion of the duodenum. Moderate Sedation:      Per Anesthesia Care Recommendation:           - Patient has a contact number available for                            emergencies. The signs and symptoms of potential                            delayed complications were discussed with the                            patient. Return to normal activities tomorrow.                            Written discharge instructions were provided to the                            patient.                           - Resume previous diet.                           - Continue present medications.                           - Await pathology results.                           - Return to GI clinic in 4 months. Procedure Code(s):        --- Professional ---                           [phone], Esophagogastroduodenoscopy, flexible,                            transoral; with biopsy, single or multiple Diagnosis Code(s):        ---  Professional ---                           K29.70, Gastritis, unspecified, without bleeding                           D50.9, Iron deficiency anemia, unspecified CPT copyright 2019 American Medical Association. All rights reserved. The codes documented in this report are preliminary and upon coder review may  be revised to meet current compliance requirements. Elon Alas. Abbey Chatters, DO [place] Abbey Chatters, DO 07/13/2021 1:29:53 PM This report has been signed electronically. Number of Addenda: 0

## 2021-07-13 NOTE — Anesthesia Postprocedure Evaluation (Signed)
Anesthesia Post Note  Patient: GERTRUDE BUCKS  Procedure(s) Performed: COLONOSCOPY WITH PROPOFOL ESOPHAGOGASTRODUODENOSCOPY (EGD) WITH PROPOFOL BIOPSY  Patient location during evaluation: PACU Anesthesia Type: General Level of consciousness: awake and alert Pain management: pain level controlled Vital Signs Assessment: post-procedure vital signs reviewed and stable Respiratory status: spontaneous breathing, nonlabored ventilation, respiratory function stable and patient connected to nasal cannula oxygen Cardiovascular status: blood pressure returned to baseline and stable Postop Assessment: no apparent nausea or vomiting Anesthetic complications: no   No notable events documented.   Last Vitals:  Vitals:   07/13/21 1246 07/13/21 1346  BP: (!) 111/48 (!) 92/40  Pulse: 67   Resp: 19 15  Temp: 36.6 C 36.7 C  SpO2: 100% 97%    Last Pain:  Vitals:   07/13/21 1346  TempSrc: Oral  PainSc: 0-No pain                 Shona Needles

## 2021-07-13 NOTE — Discharge Instructions (Addendum)
EGD Discharge instructions Please read the instructions outlined below and refer to this sheet in the next few weeks. These discharge instructions provide you with general information on caring for yourself after you leave the hospital. Your doctor may also give you specific instructions. While your treatment has been planned according to the most current medical practices available, unavoidable complications occasionally occur. If you have any problems or questions after discharge, please call your doctor. ACTIVITY You may resume your regular activity but move at a slower pace for the next 24 hours.  Take frequent rest periods for the next 24 hours.  Walking will help expel (get rid of) the air and reduce the bloated feeling in your abdomen.  No driving for 24 hours (because of the anesthesia (medicine) used during the test).  You may shower.  Do not sign any important legal documents or operate any machinery for 24 hours (because of the anesthesia used during the test).  NUTRITION Drink plenty of fluids.  You may resume your normal diet.  Begin with a light meal and progress to your normal diet.  Avoid alcoholic beverages for 24 hours or as instructed by your caregiver.  MEDICATIONS You may resume your normal medications unless your caregiver tells you otherwise.  WHAT YOU CAN EXPECT TODAY You may experience abdominal discomfort such as a feeling of fullness or "gas" pains.  FOLLOW-UP Your doctor will discuss the results of your test with you.  SEEK IMMEDIATE MEDICAL ATTENTION IF ANY OF THE FOLLOWING OCCUR: Excessive nausea (feeling sick to your stomach) and/or vomiting.  Severe abdominal pain and distention (swelling).  Trouble swallowing.  Temperature over 101 F (37.8 C).  Rectal bleeding or vomiting of blood.     Colonoscopy Discharge Instructions  Read the instructions outlined below and refer to this sheet in the next few weeks. These discharge instructions provide you with  general information on caring for yourself after you leave the hospital. Your doctor may also give you specific instructions. While your treatment has been planned according to the most current medical practices available, unavoidable complications occasionally occur.   ACTIVITY You may resume your regular activity, but move at a slower pace for the next 24 hours.  Take frequent rest periods for the next 24 hours.  Walking will help get rid of the air and reduce the bloated feeling in your belly (abdomen).  No driving for 24 hours (because of the medicine (anesthesia) used during the test).   Do not sign any important legal documents or operate any machinery for 24 hours (because of the anesthesia used during the test).  NUTRITION Drink plenty of fluids.  You may resume your normal diet as instructed by your doctor.  Begin with a light meal and progress to your normal diet. Heavy or fried foods are harder to digest and may make you feel sick to your stomach (nauseated).  Avoid alcoholic beverages for 24 hours or as instructed.  MEDICATIONS You may resume your normal medications unless your doctor tells you otherwise.  WHAT YOU CAN EXPECT TODAY Some feelings of bloating in the abdomen.  Passage of more gas than usual.  Spotting of blood in your stool or on the toilet paper.  IF YOU HAD POLYPS REMOVED DURING THE COLONOSCOPY: No aspirin products for 7 days or as instructed.  No alcohol for 7 days or as instructed.  Eat a soft diet for the next 24 hours.  FINDING OUT THE RESULTS OF YOUR TEST Not all test results are  available during your visit. If your test results are not back during the visit, make an appointment with your caregiver to find out the results. Do not assume everything is normal if you have not heard from your caregiver or the medical facility. It is important for you to follow up on all of your test results.  SEEK IMMEDIATE MEDICAL ATTENTION IF: You have more than a spotting of  blood in your stool.  Your belly is swollen (abdominal distention).  You are nauseated or vomiting.  You have a temperature over 101.  You have abdominal pain or discomfort that is severe or gets worse throughout the day.   Your EGD revealed a mild amount inflammation your stomach.  I took biopsies of this to rule infection a bacteria called H. pylori.  No ulcers or bleeding identified.  No malignancy or polyps.  Your colonoscopy was relatively unremarkable.  I did not find any polyps or evidence of colon cancer.  I recommend repeating colonoscopy in 10 years for colon cancer screening purposes.  You do have diverticulosis and internal hemorrhoids. I would recommend increasing fiber in your diet or adding OTC Benefiber/Metamucil. Be sure to drink at least 4 to 6 glasses of water daily.   I do not believe your iron deficiency is coming from GI blood loss though there are 2 portions of the small bowel that we cannot reach with endoscopy.  If your hemoglobin drops below 10, we will consider capsule endoscopy to evaluate these areas for completeness.  Follow-up with GI 4 months  OFFICE TO CALL WITH APPOINTMENT   I hope you have a great rest of your week!  Hennie Duos. [name], D.O. Gastroenterology and Hepatology [facility]

## 2021-07-14 LAB — SURGICAL PATHOLOGY

## 2021-07-15 DIAGNOSIS — M25512 Pain in left shoulder: Secondary | ICD-10-CM | POA: Insufficient documentation

## 2021-07-20 ENCOUNTER — Encounter (HOSPITAL_COMMUNITY): Payer: Self-pay | Admitting: Internal Medicine

## 2021-08-02 DIAGNOSIS — M7542 Impingement syndrome of left shoulder: Secondary | ICD-10-CM

## 2021-08-02 DIAGNOSIS — G5602 Carpal tunnel syndrome, left upper limb: Secondary | ICD-10-CM | POA: Insufficient documentation

## 2021-08-02 DIAGNOSIS — M25512 Pain in left shoulder: Secondary | ICD-10-CM | POA: Diagnosis not present

## 2021-08-02 HISTORY — DX: Carpal tunnel syndrome, left upper limb: G56.02

## 2021-08-02 HISTORY — DX: Impingement syndrome of left shoulder: M75.42

## 2021-08-17 ENCOUNTER — Ambulatory Visit (HOSPITAL_COMMUNITY)
Admission: RE | Admit: 2021-08-17 | Discharge: 2021-08-17 | Disposition: A | Discharge status: Home / Self Care | Payer: Medicare HMO | Source: Ambulatory Visit | Attending: Family Medicine | Admitting: Family Medicine

## 2021-08-17 ENCOUNTER — Other Ambulatory Visit: Payer: Self-pay

## 2021-08-17 DIAGNOSIS — N1832 Chronic kidney disease, stage 3b: Secondary | ICD-10-CM | POA: Diagnosis not present

## 2021-08-17 DIAGNOSIS — I952 Hypotension due to drugs: Secondary | ICD-10-CM | POA: Diagnosis not present

## 2021-08-17 DIAGNOSIS — Z780 Asymptomatic menopausal state: Secondary | ICD-10-CM | POA: Diagnosis not present

## 2021-08-17 DIAGNOSIS — Z6829 Body mass index (BMI) 29.0-29.9, adult: Secondary | ICD-10-CM | POA: Diagnosis not present

## 2021-08-17 DIAGNOSIS — M85852 Other specified disorders of bone density and structure, left thigh: Secondary | ICD-10-CM | POA: Diagnosis not present

## 2021-08-17 DIAGNOSIS — D508 Other iron deficiency anemias: Secondary | ICD-10-CM | POA: Diagnosis not present

## 2021-08-19 DIAGNOSIS — L237 Allergic contact dermatitis due to plants, except food: Secondary | ICD-10-CM | POA: Diagnosis not present

## 2021-08-24 DIAGNOSIS — Z6828 Body mass index (BMI) 28.0-28.9, adult: Secondary | ICD-10-CM | POA: Diagnosis not present

## 2021-08-24 DIAGNOSIS — N1832 Chronic kidney disease, stage 3b: Secondary | ICD-10-CM | POA: Diagnosis not present

## 2021-08-24 DIAGNOSIS — I952 Hypotension due to drugs: Secondary | ICD-10-CM | POA: Diagnosis not present

## 2021-08-24 DIAGNOSIS — K760 Fatty (change of) liver, not elsewhere classified: Secondary | ICD-10-CM | POA: Diagnosis not present

## 2021-08-24 DIAGNOSIS — R42 Dizziness and giddiness: Secondary | ICD-10-CM | POA: Diagnosis not present

## 2021-09-06 ENCOUNTER — Encounter: Payer: Medicare HMO | Admitting: Family Medicine

## 2021-09-06 ENCOUNTER — Encounter: Payer: Self-pay | Admitting: Family Medicine

## 2021-09-06 ENCOUNTER — Other Ambulatory Visit: Payer: Self-pay

## 2021-09-06 DIAGNOSIS — R5383 Other fatigue: Secondary | ICD-10-CM | POA: Diagnosis not present

## 2021-09-06 DIAGNOSIS — R42 Dizziness and giddiness: Secondary | ICD-10-CM | POA: Diagnosis not present

## 2021-09-06 DIAGNOSIS — R059 Cough, unspecified: Secondary | ICD-10-CM | POA: Diagnosis not present

## 2021-09-06 DIAGNOSIS — H9209 Otalgia, unspecified ear: Secondary | ICD-10-CM | POA: Diagnosis not present

## 2021-09-06 DIAGNOSIS — J029 Acute pharyngitis, unspecified: Secondary | ICD-10-CM | POA: Diagnosis not present

## 2021-09-06 DIAGNOSIS — R519 Headache, unspecified: Secondary | ICD-10-CM | POA: Diagnosis not present

## 2021-09-06 NOTE — Progress Notes (Signed)
   VIRTUAL VISIT VIA VIDEO  I connected with Kathryn Lynn on 09/06/21 at 10:30 AM EDT by elemedicine application and verified that I am speaking with the correct person using two identifiers. Location patient: Home Location provider: [facility], Office Persons participating in the virtual visit: Patient, Dr. Claiborne Billings and Reece Agar. Cesar, CMA  I discussed the limitations of evaluation and management by telemedicine and the availability of in person appointments. The patient expressed understanding and agreed to proceed.     Patient was not able to log onto video platform.  Appointment canceled

## 2021-09-08 ENCOUNTER — Telehealth: Payer: Medicare HMO | Admitting: Family Medicine

## 2021-10-25 ENCOUNTER — Other Ambulatory Visit (HOSPITAL_COMMUNITY): Payer: Self-pay | Admitting: Family Medicine

## 2021-10-25 DIAGNOSIS — Z1231 Encounter for screening mammogram for malignant neoplasm of breast: Secondary | ICD-10-CM

## 2021-11-12 ENCOUNTER — Encounter: Payer: Self-pay | Admitting: Gastroenterology

## 2021-11-12 ENCOUNTER — Ambulatory Visit: Payer: Medicare HMO | Admitting: Gastroenterology

## 2021-11-12 ENCOUNTER — Other Ambulatory Visit: Payer: Self-pay

## 2021-11-12 VITALS — BP 138/78 | HR 90 | Temp 96.8°F | Ht 62.0 in | Wt 164.6 lb

## 2021-11-12 DIAGNOSIS — I952 Hypotension due to drugs: Secondary | ICD-10-CM | POA: Diagnosis not present

## 2021-11-12 DIAGNOSIS — D509 Iron deficiency anemia, unspecified: Secondary | ICD-10-CM | POA: Diagnosis not present

## 2021-11-12 DIAGNOSIS — R42 Dizziness and giddiness: Secondary | ICD-10-CM | POA: Diagnosis not present

## 2021-11-12 DIAGNOSIS — K760 Fatty (change of) liver, not elsewhere classified: Secondary | ICD-10-CM | POA: Diagnosis not present

## 2021-11-12 DIAGNOSIS — Z6828 Body mass index (BMI) 28.0-28.9, adult: Secondary | ICD-10-CM | POA: Diagnosis not present

## 2021-11-12 DIAGNOSIS — N1832 Chronic kidney disease, stage 3b: Secondary | ICD-10-CM | POA: Diagnosis not present

## 2021-11-12 NOTE — Patient Instructions (Signed)
If your hemoglobin drops below 10, Dr. Ailene Ravel recommends you have a small bowel capsule study. Continue to follow labs with Dr. Wolfgang Phoenix and let us know if your number drops.  Return to the office as needed.

## 2021-11-12 NOTE — Progress Notes (Signed)
Primary Care Physician: Ma Hillock, DO  Primary Gastroenterologist:  Elon Alas. Abbey Chatters, DO   Chief Complaint  Patient presents with   Anemia    F/u.    HPI: Kathryn Lynn is a 70 y.o. female here for follow-up of her deficiency anemia.  He was last seen in the office in July.  At that time she had completed 5 iron infusions.  Prior to this her ferritin was 6, after iron infusions her ferritin was up to 175.  Also element of anemia of chronic disease in the setting of chronic kidney disease stage IIIb.  B12 normal.  She completed EGD and colonoscopy as below with no findings to indicate occult GI blood loss as cause of IDA.     Labs from September 2022: White blood cell count 6500, hemoglobin 12.3, hematocrit 36.7, MCV 87.2, platelets 213,000.  Creatinine 1.67.  Labs in January 2022, hemoglobin 12.8.  April 2022 hemoglobin 10.8.  Back in May 2022 her ferritin was 6.  In July 2022 her ferritin was up to 175.  Today: feels well. No bowel concerns. No melena, brbpr. No abdominal pain. Appetite good. Just returned from vacation. Ate too much and gained 5 pounds. She has been having some left knee pain and thinks she needs a shot. Has follow up today for labs with Dr. Theador Hawthorne and sees PCP in few weeks.   EGD August 2022: -Gastritis. Biopsied.  Benign findings, no H. pylori. -Normal duodenal bulb, first portion of the duodenum and second portion of the duodenum.  Colonoscopy August 2022: - No finding today to indicate occult GI blood loss as cause of iron deficiency anemia. - Non-bleeding internal hemorrhoids. - The entire examined colon is normal. - Diverticulosis in the sigmoid colon, in the descending colon, in the transverse colon and in the ascending colon. - No specimens collected -screening colonoscopy 10 years.   Current Outpatient Medications  Medication Sig Dispense Refill   Calcium Carb-Cholecalciferol (CALCIUM 600 + D PO) Take 1 tablet by mouth daily.      cetirizine (ZYRTEC) 10 MG tablet Take 10 mg by mouth daily.     cimetidine (TAGAMET) 200 MG tablet Take 200 mg by mouth 2 (two) times daily.     cyclobenzaprine (FLEXERIL) 5 MG tablet Take 1 tablet (5 mg total) by mouth 3 (three) times daily as needed. (Patient taking differently: Take 5 mg by mouth at bedtime as needed for muscle spasms.) 30 tablet 5   fluticasone (FLONASE) 50 MCG/ACT nasal spray Place 1 spray into both nostrils 2 (two) times daily.     gabapentin (NEURONTIN) 300 MG capsule Take 1 capsule (300 mg total) by mouth 2 (two) times daily. (Patient taking differently: Take 300 mg by mouth 2 (two) times daily.) 180 capsule 1   hydrOXYzine (ATARAX/VISTARIL) 25 MG tablet Take 25 mg by mouth every 4 (four) hours as needed for itching.     Magnesium 400 MG TABS Take 400 mg by mouth 2 (two) times daily.     Melatonin 10 MG TABS Take 10 mg by mouth at bedtime as needed (sleep).     Omega-3 Fatty Acids (OMEGA-3 FISH OIL) 300 MG CAPS Take 300 mg by mouth 2 (two) times daily.     OVER THE COUNTER MEDICATION Apply 1-2 patches topically daily as needed (pain). [place] Pain patches     simvastatin (ZOCOR) 20 MG tablet Take 1 tablet (20 mg total) by mouth at bedtime. 90 tablet 3  Vitamin D, Cholecalciferol, 25 MCG (1000 UT) CAPS Take 1,000 Units by mouth daily.     vitamin E 180 MG (400 UNITS) capsule Take 400 Units by mouth daily.     No current facility-administered medications for this visit.    Allergies as of 11/12/2021 - Review Complete 11/12/2021  Allergen Reaction Noted   Adhesive [tape] Rash 12/15/2014   Cephalosporins Rash 05/10/2019   Clindamycin/lincomycin Palpitations 11/18/2019   Latex Rash 11/21/2019   Penicillins Rash 05/03/2011    ROS:  General: Negative for anorexia, weight loss, fever, chills, fatigue, weakness. ENT: Negative for hoarseness, difficulty swallowing , nasal congestion. CV: Negative for chest pain, angina, palpitations, dyspnea on exertion, peripheral  edema.  Respiratory: Negative for dyspnea at rest, dyspnea on exertion, cough, sputum, wheezing.  GI: See history of present illness. GU:  Negative for dysuria, hematuria, urinary incontinence, urinary frequency, nocturnal urination.  Endo: Negative for unusual weight change.    Physical Examination:   BP 138/78   Pulse 90   Temp (!) 96.8 F (36 C)   Ht 5\' 2"  (1.575 m)   Wt 164 lb 9.6 oz (74.7 kg)   BMI 30.11 kg/m   General: Well-nourished, well-developed in no acute distress.  Eyes: No icterus. Mouth: masked.  Abdomen: Bowel sounds are normal, nontender, nondistended, no hepatosplenomegaly or masses, no abdominal bruits or hernia , no rebound or guarding.   Extremities: No lower extremity edema. No clubbing or deformities. Neuro: Alert and oriented x 4   Skin: Warm and dry, no jaundice.   Psych: Alert and cooperative, normal mood and affect.  Labs:  See above  Imaging Studies: No results found.   Assessment:  IDA: EGD and colonoscopy unremarkable. Her ferritin was normal in 06/2021 after iron infusions. Her Hgb has remained stable with last labs in 08/2021. Goes for labs today. Nothing so far to indicate occult GI bleeding as cause of her IDA.  Plan: Dr. 09/2021 advises to consider small bowel capsule endoscopy if hemoglobin drops below 10. We will follow up on today's labs.  She will follow up as needed here and is aware to let [name] know if her hemoglobin drops below 10.

## 2021-11-15 ENCOUNTER — Encounter: Payer: Self-pay | Admitting: Family Medicine

## 2021-11-15 ENCOUNTER — Other Ambulatory Visit: Payer: Self-pay | Admitting: Family Medicine

## 2021-11-18 ENCOUNTER — Other Ambulatory Visit: Payer: Self-pay

## 2021-11-19 ENCOUNTER — Encounter: Payer: Self-pay | Admitting: Family Medicine

## 2021-11-19 ENCOUNTER — Ambulatory Visit (INDEPENDENT_AMBULATORY_CARE_PROVIDER_SITE_OTHER): Payer: Medicare HMO | Admitting: Family Medicine

## 2021-11-19 VITALS — BP 117/74 | HR 80 | Temp 98.0°F | Ht 61.75 in | Wt 162.0 lb

## 2021-11-19 DIAGNOSIS — M9979 Connective tissue and disc stenosis of intervertebral foramina of abdomen and other regions: Secondary | ICD-10-CM

## 2021-11-19 DIAGNOSIS — M545 Low back pain, unspecified: Secondary | ICD-10-CM

## 2021-11-19 DIAGNOSIS — E782 Mixed hyperlipidemia: Secondary | ICD-10-CM

## 2021-11-19 DIAGNOSIS — Z0000 Encounter for general adult medical examination without abnormal findings: Secondary | ICD-10-CM | POA: Diagnosis not present

## 2021-11-19 DIAGNOSIS — D509 Iron deficiency anemia, unspecified: Secondary | ICD-10-CM

## 2021-11-19 DIAGNOSIS — M8580 Other specified disorders of bone density and structure, unspecified site: Secondary | ICD-10-CM | POA: Diagnosis not present

## 2021-11-19 DIAGNOSIS — Z131 Encounter for screening for diabetes mellitus: Secondary | ICD-10-CM

## 2021-11-19 DIAGNOSIS — E669 Obesity, unspecified: Secondary | ICD-10-CM | POA: Diagnosis not present

## 2021-11-19 DIAGNOSIS — N1832 Chronic kidney disease, stage 3b: Secondary | ICD-10-CM | POA: Diagnosis not present

## 2021-11-19 DIAGNOSIS — M5416 Radiculopathy, lumbar region: Secondary | ICD-10-CM

## 2021-11-19 DIAGNOSIS — I10 Essential (primary) hypertension: Secondary | ICD-10-CM

## 2021-11-19 DIAGNOSIS — M519 Unspecified thoracic, thoracolumbar and lumbosacral intervertebral disc disorder: Secondary | ICD-10-CM

## 2021-11-19 LAB — LIPID PANEL
Cholesterol: 194 mg/dL (ref 0–200)
HDL: 85.7 mg/dL (ref >39.00)
LDL Cholesterol: 92 mg/dL (ref 0–99)
NonHDL: 107.82
Total CHOL/HDL Ratio: 2
Triglycerides: 80 mg/dL (ref 0.0–149.0)
VLDL: 16 mg/dL (ref 0.0–40.0)

## 2021-11-19 LAB — TSH: TSH: 2.71 u[IU]/mL (ref 0.35–5.50)

## 2021-11-19 LAB — HEMOGLOBIN A1C: Hgb A1c MFr Bld: 5.4 % (ref 4.6–6.5)

## 2021-11-19 LAB — MAGNESIUM: Magnesium: 2.2 mg/dL (ref 1.5–2.5)

## 2021-11-19 MED ORDER — SIMVASTATIN 20 MG PO TABS: 20.0000 mg | ORAL_TABLET | Freq: Every day | ORAL | 3 refills | Status: DC

## 2021-11-19 MED ORDER — CYCLOBENZAPRINE HCL 5 MG PO TABS
5.0000 mg | ORAL_TABLET | Freq: Every evening | ORAL | 1 refills | Status: DC | PRN
Start: 2021-11-19 — End: 2022-05-13

## 2021-11-19 MED ORDER — GABAPENTIN 300 MG PO CAPS: 300.0000 mg | ORAL_CAPSULE | Freq: Two times a day (BID) | ORAL | 1 refills | Status: DC
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
## 2021-11-19 NOTE — Patient Instructions (Signed)
°Great to see you today.  °I have refilled the medication(s) we provide.  ° °If labs were collected, we will inform you of lab results once received either by echart message or telephone call.  ° - echart message- for normal results that have been seen by the patient already.  ° - telephone call: abnormal results or if patient has not viewed results in their echart. °Health Maintenance After Age 70 °After age 70, you are at a higher risk for certain long-term diseases and infections as well as injuries from falls. Falls are a major cause of broken bones and head injuries in people who are older than age 70. Getting regular preventive care can help to keep you healthy and well. Preventive care includes getting regular testing and making lifestyle changes as recommended by your health care provider. Talk with your health care provider about: °Which screenings and tests you should have. A screening is a test that checks for a disease when you have no symptoms. °A diet and exercise plan that is right for you. °What should I know about screenings and tests to prevent falls? °Screening and testing are the best ways to find a health problem early. Early diagnosis and treatment give you the best chance of managing medical conditions that are common after age 70. Certain conditions and lifestyle choices may make you more likely to have a fall. Your health care provider may recommend: °Regular vision checks. Poor vision and conditions such as cataracts can make you more likely to have a fall. If you wear glasses, make sure to get your prescription updated if your vision changes. °Medicine review. Work with your health care provider to regularly review all of the medicines you are taking, including over-the-counter medicines. Ask your health care provider about any side effects that may make you more likely to have a fall. Tell your health care provider if any medicines that you take make you feel dizzy or sleepy. °Strength  and balance checks. Your health care provider may recommend certain tests to check your strength and balance while standing, walking, or changing positions. °Foot health exam. Foot pain and numbness, as well as not wearing proper footwear, can make you more likely to have a fall. °Screenings, including: °Osteoporosis screening. Osteoporosis is a condition that causes the bones to get weaker and break more easily. °Blood pressure screening. Blood pressure changes and medicines to control blood pressure can make you feel dizzy. °Depression screening. You may be more likely to have a fall if you have a fear of falling, feel depressed, or feel unable to do activities that you used to do. °Alcohol use screening. Using too much alcohol can affect your balance and may make you more likely to have a fall. °Follow these instructions at home: °Lifestyle °Do not drink alcohol if: °Your health care provider tells you not to drink. °If you drink alcohol: °Limit how much you have to: °0-1 drink a day for women. °0-2 drinks a day for men. °Know how much alcohol is in your drink. In the [place] one drink equals one 12 oz bottle of beer (355 mL), one 5 oz glass of wine (148 mL), or one 1½ oz glass of hard liquor (44 mL). °Do not use any products that contain nicotine or tobacco. These products include cigarettes, chewing tobacco, and vaping devices, such as e-cigarettes. If you need help quitting, ask your health care provider. °Activity ° °Follow a regular exercise program to stay fit. This will help you maintain   your balance. Ask your health care provider what types of exercise are appropriate for you. °If you need a cane or walker, use it as recommended by your health care provider. °Wear supportive shoes that have nonskid soles. °Safety ° °Remove any tripping hazards, such as rugs, cords, and clutter. °Install safety equipment such as grab bars in bathrooms and safety rails on stairs. °Keep rooms and walkways well-lit. °General  instructions °Talk with your health care provider about your risks for falling. Tell your health care provider if: °You fall. Be sure to tell your health care provider about all falls, even ones that seem minor. °You feel dizzy, tiredness (fatigue), or off-balance. °Take over-the-counter and prescription medicines only as told by your health care provider. These include supplements. °Eat a healthy diet and maintain a healthy weight. A healthy diet includes low-fat dairy products, low-fat (lean) meats, and fiber from whole grains, beans, and lots of fruits and vegetables. °Stay current with your vaccines. °Schedule regular health, dental, and eye exams. °Summary °Having a healthy lifestyle and getting preventive care can help to protect your health and wellness after age 70. °Screening and testing are the best way to find a health problem early and help you avoid having a fall. Early diagnosis and treatment give you the best chance for managing medical conditions that are more common for people who are older than age 70. °Falls are a major cause of broken bones and head injuries in people who are older than age 70. Take precautions to prevent a fall at home. °Work with your health care provider to learn what changes you can make to improve your health and wellness and to prevent falls. °This information is not intended to replace advice given to you by your health care provider. Make sure you discuss any questions you have with your health care provider. °Document Revised: 04/12/2021 Document Reviewed: 04/12/2021 °Elsevier Patient Education © 2022 [employer] ° °

## 2021-11-20 LAB — IRON,TIBC AND FERRITIN PANEL
%SAT: 20 % (calc) (ref 16–45)
Ferritin: 23 ng/mL (ref 16–288)
Iron: 60 ug/dL (ref 45–160)
TIBC: 297 mcg/dL (calc) (ref 250–450)

## 2021-11-24 DIAGNOSIS — N1832 Chronic kidney disease, stage 3b: Secondary | ICD-10-CM | POA: Diagnosis not present

## 2021-11-24 DIAGNOSIS — N19 Unspecified kidney failure: Secondary | ICD-10-CM | POA: Diagnosis not present

## 2021-11-24 DIAGNOSIS — I9589 Other hypotension: Secondary | ICD-10-CM | POA: Diagnosis not present

## 2021-11-25 ENCOUNTER — Ambulatory Visit (HOSPITAL_COMMUNITY)
Admission: RE | Admit: 2021-11-25 | Discharge: 2021-11-25 | Disposition: A | Discharge status: Home / Self Care | Payer: Medicare HMO | Source: Ambulatory Visit | Attending: Family Medicine | Admitting: Family Medicine

## 2021-11-25 ENCOUNTER — Other Ambulatory Visit: Payer: Self-pay

## 2021-11-25 DIAGNOSIS — Z1231 Encounter for screening mammogram for malignant neoplasm of breast: Secondary | ICD-10-CM | POA: Insufficient documentation

## 2021-12-01 DIAGNOSIS — M5416 Radiculopathy, lumbar region: Secondary | ICD-10-CM | POA: Diagnosis not present

## 2021-12-01 DIAGNOSIS — Z6830 Body mass index (BMI) 30.0-30.9, adult: Secondary | ICD-10-CM | POA: Diagnosis not present

## 2021-12-01 DIAGNOSIS — R030 Elevated blood-pressure reading, without diagnosis of hypertension: Secondary | ICD-10-CM | POA: Diagnosis not present

## 2021-12-03 ENCOUNTER — Other Ambulatory Visit: Payer: Self-pay | Admitting: Neurological Surgery

## 2021-12-03 DIAGNOSIS — M5416 Radiculopathy, lumbar region: Secondary | ICD-10-CM

## 2021-12-14 ENCOUNTER — Telehealth: Payer: Self-pay | Admitting: "Endocrinology

## 2021-12-14 NOTE — Telephone Encounter (Signed)
Left VM for pt in regards to a referral on her Adrenal. Please sch with Dr Dorris Fetch

## 2021-12-26 ENCOUNTER — Ambulatory Visit
Admission: RE | Admit: 2021-12-26 | Discharge: 2021-12-26 | Disposition: A | Discharge status: Home / Self Care | Payer: Medicare HMO | Source: Ambulatory Visit | Attending: Neurological Surgery | Admitting: Neurological Surgery

## 2021-12-26 ENCOUNTER — Other Ambulatory Visit: Payer: Self-pay

## 2021-12-26 DIAGNOSIS — M5416 Radiculopathy, lumbar region: Secondary | ICD-10-CM | POA: Diagnosis not present

## 2021-12-26 DIAGNOSIS — M48061 Spinal stenosis, lumbar region without neurogenic claudication: Secondary | ICD-10-CM | POA: Diagnosis not present

## 2021-12-26 DIAGNOSIS — M5137 Other intervertebral disc degeneration, lumbosacral region: Secondary | ICD-10-CM | POA: Diagnosis not present

## 2021-12-27 DIAGNOSIS — J0100 Acute maxillary sinusitis, unspecified: Secondary | ICD-10-CM | POA: Diagnosis not present

## 2022-01-05 DIAGNOSIS — M5416 Radiculopathy, lumbar region: Secondary | ICD-10-CM | POA: Diagnosis not present

## 2022-01-11 ENCOUNTER — Other Ambulatory Visit: Payer: Self-pay

## 2022-01-11 ENCOUNTER — Encounter: Payer: Self-pay | Admitting: "Endocrinology

## 2022-01-11 ENCOUNTER — Ambulatory Visit: Payer: Medicare HMO | Admitting: "Endocrinology

## 2022-01-11 VITALS — BP 116/78 | HR 104 | Ht 61.75 in | Wt 170.4 lb

## 2022-01-11 DIAGNOSIS — E2749 Other adrenocortical insufficiency: Secondary | ICD-10-CM | POA: Diagnosis not present
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
## 2022-01-13 ENCOUNTER — Telehealth: Payer: Self-pay | Admitting: "Endocrinology

## 2022-01-13 NOTE — Telephone Encounter (Signed)
Patient called and said you asked her if she ever had any trauma to her stomach. She said that she did have a hysterectomy about 30 years ago.

## 2022-01-18 DIAGNOSIS — H5210 Myopia, unspecified eye: Secondary | ICD-10-CM | POA: Diagnosis not present

## 2022-01-25 ENCOUNTER — Encounter (HOSPITAL_COMMUNITY)
Admission: RE | Admit: 2022-01-25 | Discharge: 2022-01-25 | Disposition: A | Discharge status: Home / Self Care | Payer: Medicare HMO | Source: Ambulatory Visit | Attending: "Endocrinology | Admitting: "Endocrinology

## 2022-01-25 ENCOUNTER — Ambulatory Visit: Payer: Medicare HMO | Admitting: "Endocrinology

## 2022-01-25 DIAGNOSIS — E2749 Other adrenocortical insufficiency: Secondary | ICD-10-CM | POA: Diagnosis not present

## 2022-01-25 LAB — ACTH STIMULATION, 3 TIME POINTS
Cortisol, 30 Min: 13.3 ug/dL
Cortisol, 60 Min: 14.7 ug/dL
Cortisol, Base: 7.5 ug/dL

## 2022-01-25 MED ORDER — COSYNTROPIN 0.25 MG IJ SOLR
0.2500 mg | Freq: Once | INTRAMUSCULAR | Status: AC
Start: 2022-01-25 — End: 2022-01-25
  Administered 2022-01-25: 0.25 mg via INTRAVENOUS
  Filled 2022-01-25: qty 0.25

## 2022-01-28 ENCOUNTER — Ambulatory Visit: Payer: Medicare HMO | Admitting: "Endocrinology

## 2022-01-28 ENCOUNTER — Other Ambulatory Visit: Payer: Self-pay

## 2022-01-28 ENCOUNTER — Encounter: Payer: Self-pay | Admitting: "Endocrinology

## 2022-01-28 VITALS — BP 114/80 | HR 68 | Ht 61.75 in | Wt 169.0 lb

## 2022-01-28 DIAGNOSIS — E2749 Other adrenocortical insufficiency: Secondary | ICD-10-CM
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
## 2022-02-04 ENCOUNTER — Other Ambulatory Visit: Payer: Self-pay | Admitting: Family Medicine

## 2022-02-08 DIAGNOSIS — M5416 Radiculopathy, lumbar region: Secondary | ICD-10-CM | POA: Diagnosis not present

## 2022-02-24 ENCOUNTER — Telehealth: Payer: Self-pay | Admitting: Family Medicine

## 2022-02-25 DIAGNOSIS — N1832 Chronic kidney disease, stage 3b: Secondary | ICD-10-CM | POA: Diagnosis not present

## 2022-02-25 DIAGNOSIS — I9589 Other hypotension: Secondary | ICD-10-CM | POA: Diagnosis not present

## 2022-02-25 DIAGNOSIS — N19 Unspecified kidney failure: Secondary | ICD-10-CM | POA: Diagnosis not present

## 2022-02-28 NOTE — Telephone Encounter (Signed)
Ph# [phone] ? ?Pt has 2 tubes remaining diclofenac Sodium (VOLTAREN) 1 % GEL. She tried to fill and pharmacy advised she no longer has refills.  ? ?Pt states that last time she saw Dr. Raoul Pitch refills were not needed since at the time she had 6 tubes left.  ? ?Please sent to [facility] Mail order pharmacy.  ? ?

## 2022-02-28 NOTE — Telephone Encounter (Signed)
Medication is no longer on med list. Please advise.  ?

## 2022-03-02 DIAGNOSIS — I129 Hypertensive chronic kidney disease with stage 1 through stage 4 chronic kidney disease, or unspecified chronic kidney disease: Secondary | ICD-10-CM | POA: Diagnosis not present

## 2022-03-02 DIAGNOSIS — R891 Abnormal level of hormones in specimens from other organs, systems and tissues: Secondary | ICD-10-CM | POA: Diagnosis not present

## 2022-03-02 DIAGNOSIS — N1832 Chronic kidney disease, stage 3b: Secondary | ICD-10-CM | POA: Diagnosis not present

## 2022-03-02 DIAGNOSIS — E6609 Other obesity due to excess calories: Secondary | ICD-10-CM | POA: Diagnosis not present

## 2022-03-02 MED ORDER — DICLOFENAC SODIUM 1 % EX GEL: 2.0000 g | Freq: Four times a day (QID) | CUTANEOUS | 3 refills | Status: DC

## 2022-03-02 NOTE — Telephone Encounter (Signed)
prescribed

## 2022-03-02 NOTE — Addendum Note (Signed)
Addended by: Felix Pacini A on: 03/02/2022 09:45 AM ? ? Modules accepted: Orders ? ?

## 2022-03-02 NOTE — Telephone Encounter (Signed)
LVM for pt regarding medication being to pharm.  ?

## 2022-03-09 DIAGNOSIS — R030 Elevated blood-pressure reading, without diagnosis of hypertension: Secondary | ICD-10-CM | POA: Diagnosis not present

## 2022-03-09 DIAGNOSIS — Z6831 Body mass index (BMI) 31.0-31.9, adult: Secondary | ICD-10-CM | POA: Diagnosis not present

## 2022-03-09 DIAGNOSIS — M5416 Radiculopathy, lumbar region: Secondary | ICD-10-CM | POA: Diagnosis not present

## 2022-03-17 NOTE — Progress Notes (Signed)
This encounter was created in error - please disregard.

## 2022-03-23 ENCOUNTER — Ambulatory Visit (INDEPENDENT_AMBULATORY_CARE_PROVIDER_SITE_OTHER): Payer: Medicare HMO

## 2022-03-23 DIAGNOSIS — Z0000 Encounter for general adult medical examination without abnormal findings: Secondary | ICD-10-CM

## 2022-03-23 NOTE — Patient Instructions (Addendum)
Kathryn Lynn , ?Thank you for taking time to come for your Medicare Wellness Visit. I appreciate your ongoing commitment to your health goals. Please review the following plan we discussed and let me know if I can assist you in the future.  ? ?Screening recommendations/referrals: ?Colonoscopy: Done 07/13/21 repeat every 10 years  ?Mammogram: Done 11/25/21 repeat every year  ?Bone Density: Done 08/17/21 repeat every 3 years  ?Recommended yearly ophthalmology/optometry visit for glaucoma screening and checkup ?Recommended yearly dental visit for hygiene and checkup ? ?Vaccinations: ?Influenza vaccine: Done 10/01/21 repeat every year  ?Pneumococcal vaccine: Up to date ?Tdap vaccine: Done 05/08/21 repeat every 10 years  ?Shingles vaccine: Completed   6/4, 07/30/21 ?Covid-19:Completed 2/21, 3/17, 10/09/20 & 10/01/21 ? ?Advanced directives: Please bring a copy of your health care power of attorney and living will to the office at your convenience. ? ?Conditions/risks identified: lose weight  ? ?Next appointment: Follow up in one year for your annual wellness visit  ? ? ?Preventive Care 26 Years and Older, Female ?Preventive care refers to lifestyle choices and visits with your health care provider that can promote health and wellness. ?What does preventive care include? ?A yearly physical exam. This is also called an annual well check. ?Dental exams once or twice a year. ?Routine eye exams. Ask your health care provider how often you should have your eyes checked. ?Personal lifestyle choices, including: ?Daily care of your teeth and gums. ?Regular physical activity. ?Eating a healthy diet. ?Avoiding tobacco and drug use. ?Limiting alcohol use. ?Practicing safe sex. ?Taking low-dose aspirin every day. ?Taking vitamin and mineral supplements as recommended by your health care provider. ?What happens during an annual well check? ?The services and screenings done by your health care provider during your annual well check will depend on  your age, overall health, lifestyle risk factors, and family history of disease. ?Counseling  ?Your health care provider may ask you questions about your: ?Alcohol use. ?Tobacco use. ?Drug use. ?Emotional well-being. ?Home and relationship well-being. ?Sexual activity. ?Eating habits. ?History of falls. ?Memory and ability to understand (cognition). ?Work and work Astronomer. ?Reproductive health. ?Screening  ?You may have the following tests or measurements: ?Height, weight, and BMI. ?Blood pressure. ?Lipid and cholesterol levels. These may be checked every 5 years, or more frequently if you are over 81 years old. ?Skin check. ?Lung cancer screening. You may have this screening every year starting at age 21 if you have a 30-pack-year history of smoking and currently smoke or have quit within the past 15 years. ?Fecal occult blood test (FOBT) of the stool. You may have this test every year starting at age 53. ?Flexible sigmoidoscopy or colonoscopy. You may have a sigmoidoscopy every 5 years or a colonoscopy every 10 years starting at age 83. ?Hepatitis C blood test. ?Hepatitis B blood test. ?Sexually transmitted disease (STD) testing. ?Diabetes screening. This is done by checking your blood sugar (glucose) after you have not eaten for a while (fasting). You may have this done every 1-3 years. ?Bone density scan. This is done to screen for osteoporosis. You may have this done starting at age 37. ?Mammogram. This may be done every 1-2 years. Talk to your health care provider about how often you should have regular mammograms. ?Talk with your health care provider about your test results, treatment options, and if necessary, the need for more tests. ?Vaccines  ?Your health care provider may recommend certain vaccines, such as: ?Influenza vaccine. This is recommended every year. ?Tetanus, diphtheria, and  acellular pertussis (Tdap, Td) vaccine. You may need a Td booster every 10 years. ?Zoster vaccine. You may need this  after age 69. ?Pneumococcal 13-valent conjugate (PCV13) vaccine. One dose is recommended after age 34. ?Pneumococcal polysaccharide (PPSV23) vaccine. One dose is recommended after age 46. ?Talk to your health care provider about which screenings and vaccines you need and how often you need them. ?This information is not intended to replace advice given to you by your health care provider. Make sure you discuss any questions you have with your health care provider. ?Document Released: 12/18/2015 Document Revised: 08/10/2016 Document Reviewed: 09/22/2015 ?Elsevier Interactive Patient Education ? 2017 [employer]. ? ?Fall Prevention in the Home ?Falls can cause injuries. They can happen to people of all ages. There are many things you can do to make your home safe and to help prevent falls. ?What can I do on the outside of my home? ?Regularly fix the edges of walkways and driveways and fix any cracks. ?Remove anything that might make you trip as you walk through a door, such as a raised step or threshold. ?Trim any bushes or trees on the path to your home. ?Use bright outdoor lighting. ?Clear any walking paths of anything that might make someone trip, such as rocks or tools. ?Regularly check to see if handrails are loose or broken. Make sure that both sides of any steps have handrails. ?Any raised decks and porches should have guardrails on the edges. ?Have any leaves, snow, or ice cleared regularly. ?Use sand or salt on walking paths during winter. ?Clean up any spills in your garage right away. This includes oil or grease spills. ?What can I do in the bathroom? ?Use night lights. ?Install grab bars by the toilet and in the tub and shower. Do not use towel bars as grab bars. ?Use non-skid mats or decals in the tub or shower. ?If you need to sit down in the shower, use a plastic, non-slip stool. ?Keep the floor dry. Clean up any water that spills on the floor as soon as it happens. ?Remove soap buildup in the tub or  shower regularly. ?Attach bath mats securely with double-sided non-slip rug tape. ?Do not have throw rugs and other things on the floor that can make you trip. ?What can I do in the bedroom? ?Use night lights. ?Make sure that you have a light by your bed that is easy to reach. ?Do not use any sheets or blankets that are too big for your bed. They should not hang down onto the floor. ?Have a firm chair that has side arms. You can use this for support while you get dressed. ?Do not have throw rugs and other things on the floor that can make you trip. ?What can I do in the kitchen? ?Clean up any spills right away. ?Avoid walking on wet floors. ?Keep items that you use a lot in easy-to-reach places. ?If you need to reach something above you, use a strong step stool that has a grab bar. ?Keep electrical cords out of the way. ?Do not use floor polish or wax that makes floors slippery. If you must use wax, use non-skid floor wax. ?Do not have throw rugs and other things on the floor that can make you trip. ?What can I do with my stairs? ?Do not leave any items on the stairs. ?Make sure that there are handrails on both sides of the stairs and use them. Fix handrails that are broken or loose. Make sure that  handrails are as long as the stairways. ?Check any carpeting to make sure that it is firmly attached to the stairs. Fix any carpet that is loose or worn. ?Avoid having throw rugs at the top or bottom of the stairs. If you do have throw rugs, attach them to the floor with carpet tape. ?Make sure that you have a light switch at the top of the stairs and the bottom of the stairs. If you do not have them, ask someone to add them for you. ?What else can I do to help prevent falls? ?Wear shoes that: ?Do not have high heels. ?Have rubber bottoms. ?Are comfortable and fit you well. ?Are closed at the toe. Do not wear sandals. ?If you use a stepladder: ?Make sure that it is fully opened. Do not climb a closed stepladder. ?Make  sure that both sides of the stepladder are locked into place. ?Ask someone to hold it for you, if possible. ?Clearly mark and make sure that you can see: ?Any grab bars or handrails. ?First and last step
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
## 2022-04-08 IMAGING — US US RENAL
1 series · 14 of 25 positions shown · non-contrast
Comparison: October 15, 2015

CLINICAL DATA: Stage III B chronic renal disease.

EXAM:
RENAL / URINARY TRACT ULTRASOUND COMPLETE

[Series 1: us renal · 14 of 46 slices shown]
[im 1/46]
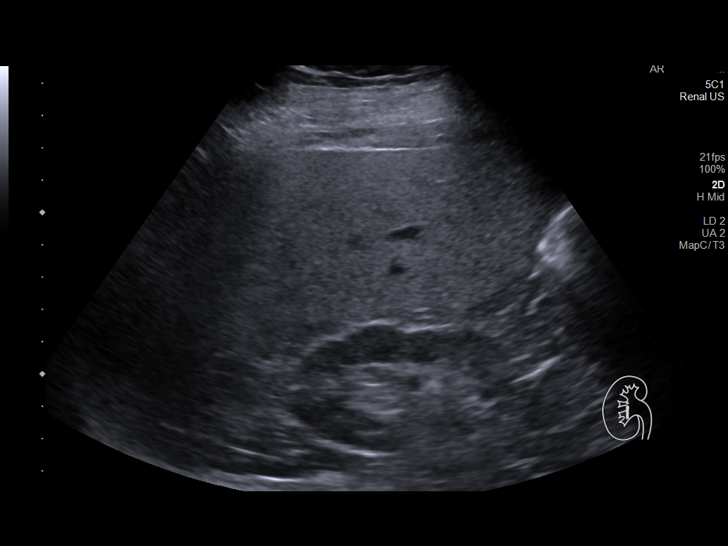
[im 4/46]
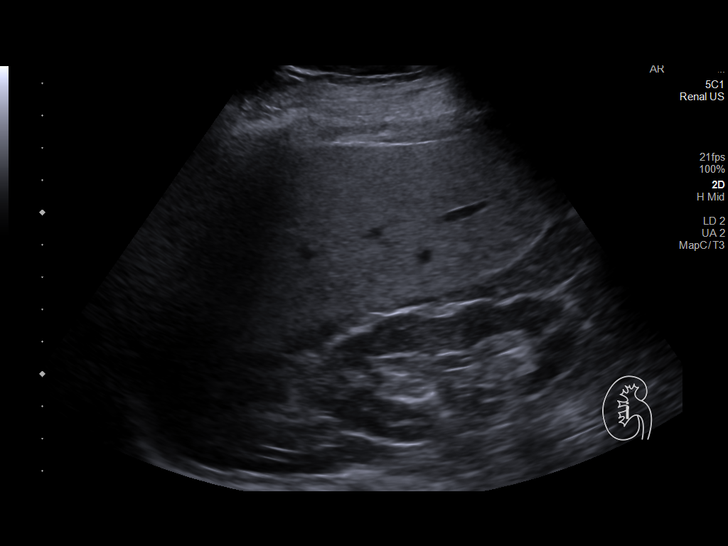
[im 8/46]
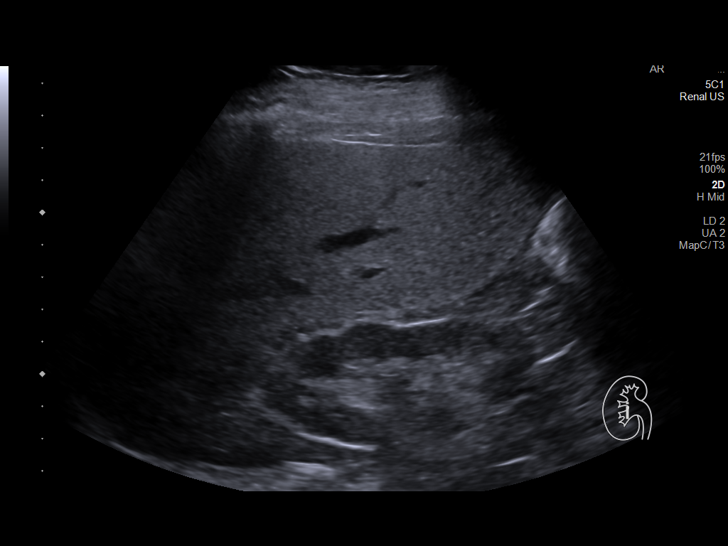
[im 12/46]
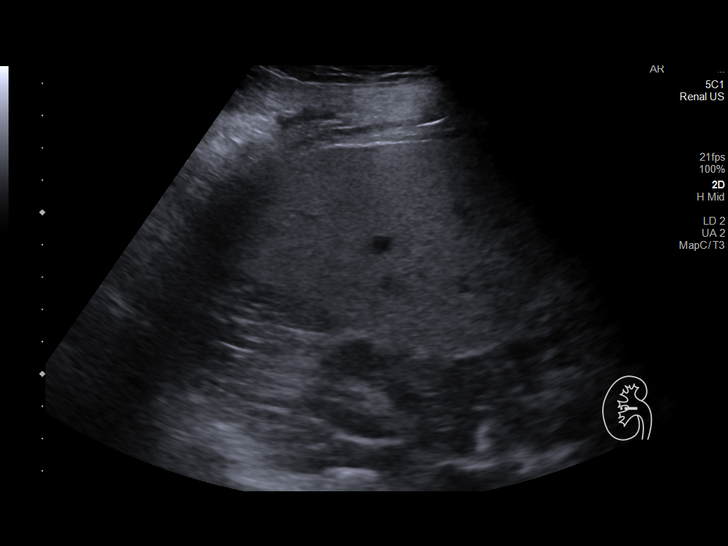
[im 16/46]
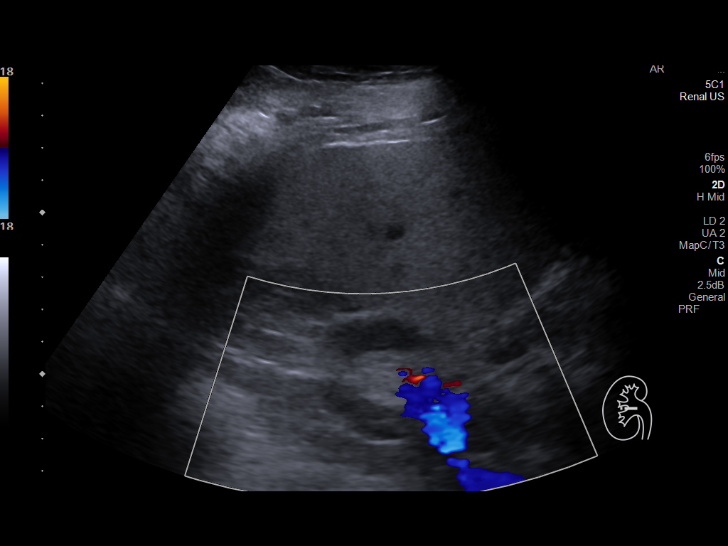
[im 17/46]
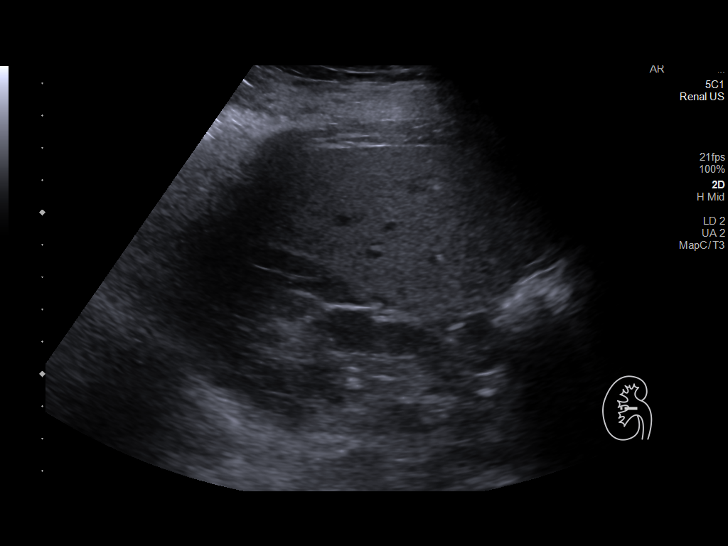
[im 21/46]
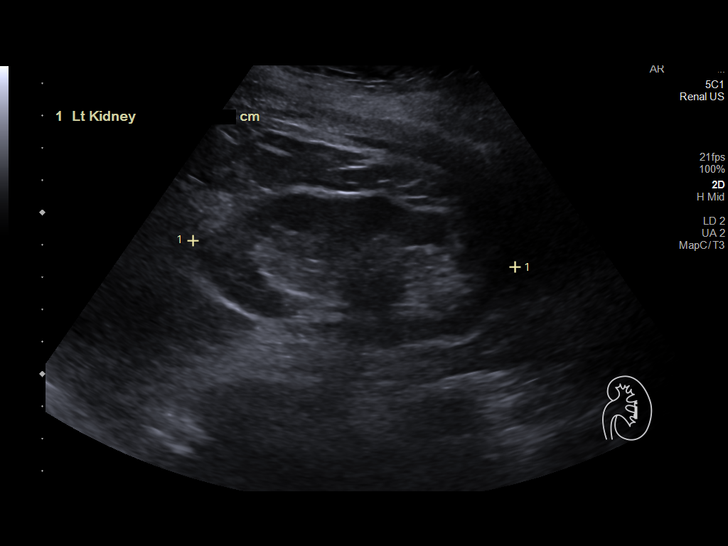
[im 25/46]
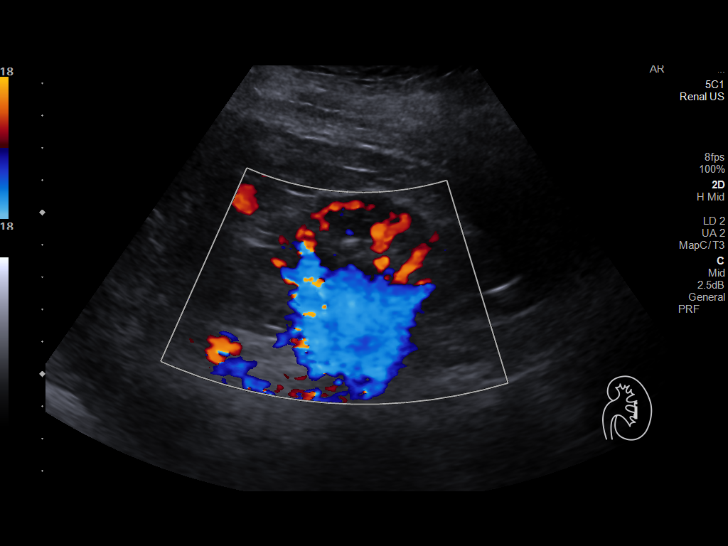
[im 29/46]
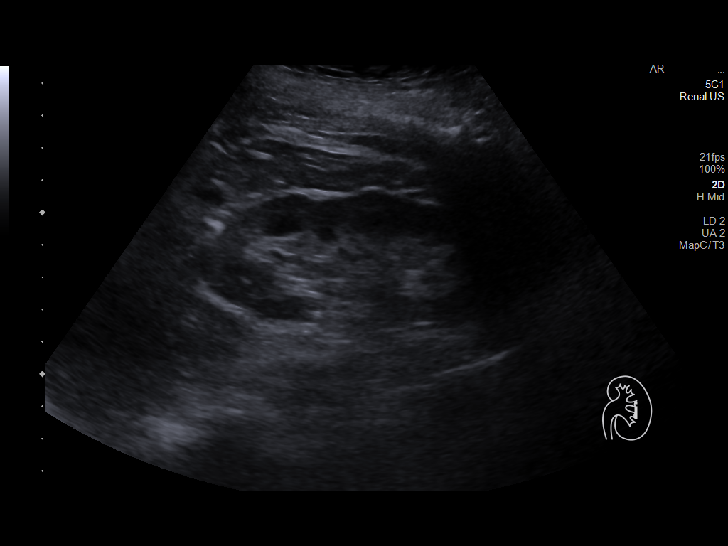
[im 31/46]
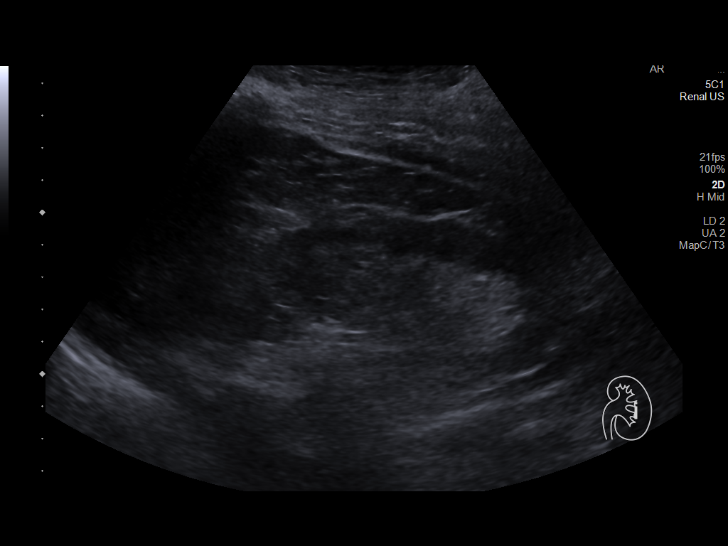
[im 34/46]
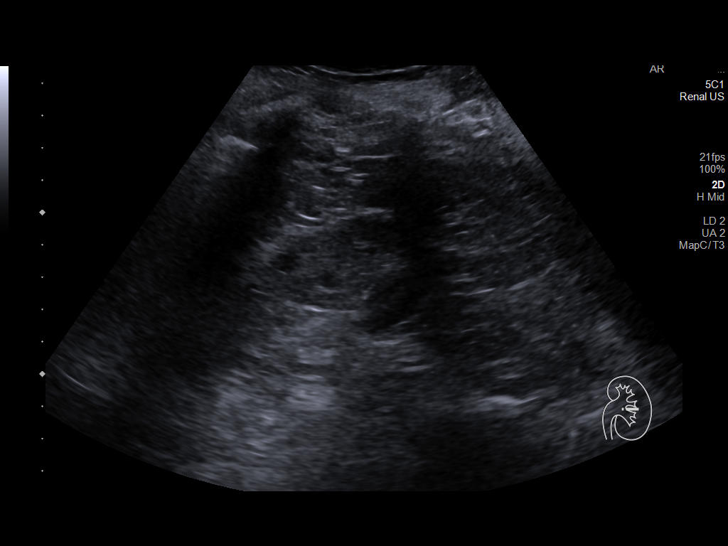
[im 38/46]
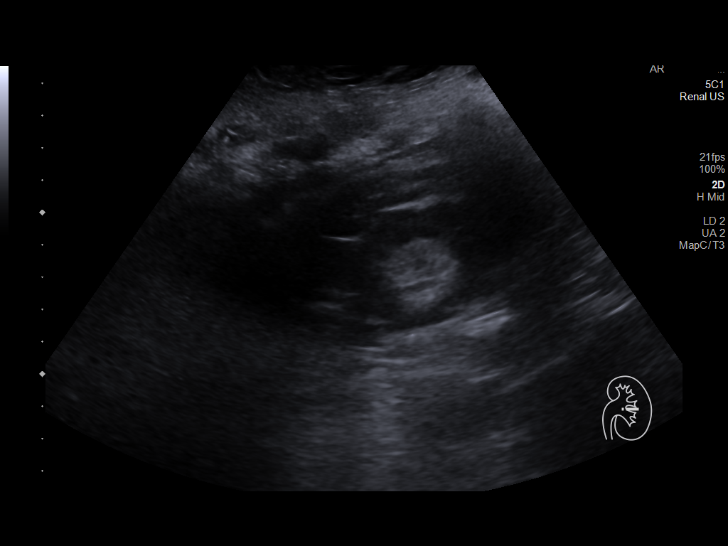
[im 42/46]
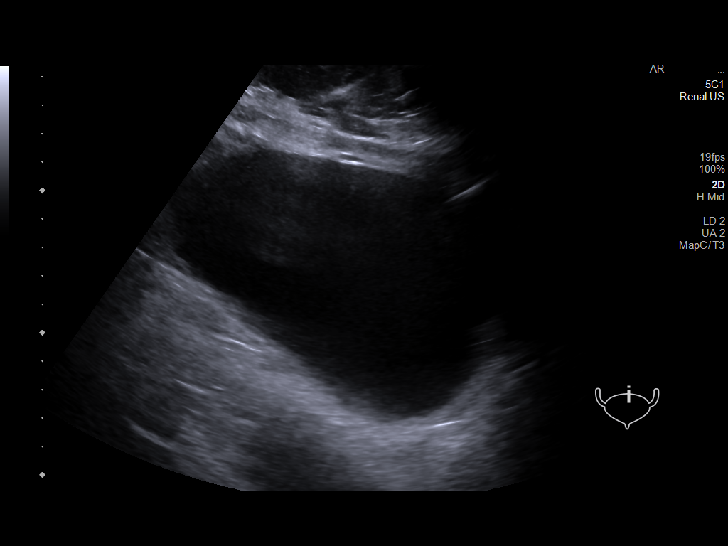
[im 46/46]
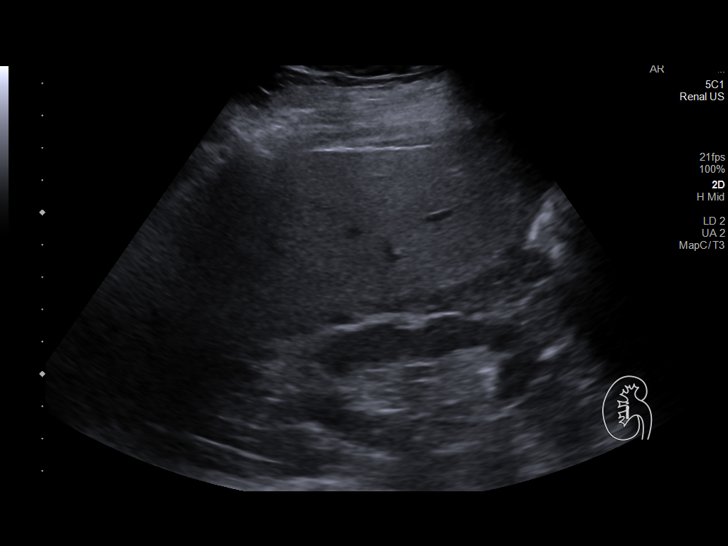

[14 of 25 positions shown; findings below may reference images not displayed]

FINDINGS: Right Kidney:

Renal measurements: 9.1 cm x 4.0 cm x 5.2 cm = volume: 98.56 mL .
Echogenicity within normal limits. No mass or hydronephrosis
visualized.

Left Kidney:

Renal measurements: 10.2 cm x 4.9 cm x 4.7 cm = volume: 122.56 mL.
Echogenicity within normal limits. No mass or hydronephrosis
visualized.

Bladder:

Appears normal for degree of bladder distention. The ureteral jets
are not clearly visualized.

Other:

Of incidental note is diffusely increased echogenicity of the liver
parenchyma.
IMPRESSION: 1. Normal ultrasonographic appearance of the kidneys.
2. Fatty liver.

## 2022-04-21 DIAGNOSIS — E2749 Other adrenocortical insufficiency: Secondary | ICD-10-CM | POA: Diagnosis not present

## 2022-04-22 DIAGNOSIS — M5416 Radiculopathy, lumbar region: Secondary | ICD-10-CM | POA: Diagnosis not present

## 2022-04-22 LAB — CORTISOL-AM, BLOOD: Cortisol - AM: 8.1 ug/dL (ref 6.2–19.4)

## 2022-04-22 LAB — TSH: TSH: 2.26 u[IU]/mL (ref 0.450–4.500)

## 2022-04-22 LAB — T4, FREE: Free T4: 1.16 ng/dL (ref 0.82–1.77)

## 2022-04-28 ENCOUNTER — Encounter: Payer: Self-pay | Admitting: "Endocrinology

## 2022-04-28 ENCOUNTER — Ambulatory Visit: Payer: Medicare HMO | Admitting: "Endocrinology

## 2022-04-28 VITALS — BP 100/62 | HR 96 | Ht 61.75 in | Wt 172.6 lb

## 2022-04-28 DIAGNOSIS — E2749 Other adrenocortical insufficiency: Secondary | ICD-10-CM | POA: Diagnosis not present
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
## 2022-04-29 ENCOUNTER — Other Ambulatory Visit: Payer: Self-pay

## 2022-04-29 MED ORDER — GABAPENTIN 300 MG PO CAPS: 300.0000 mg | ORAL_CAPSULE | Freq: Two times a day (BID) | ORAL | 0 refills | Status: DC

## 2022-05-03 ENCOUNTER — Other Ambulatory Visit: Payer: Self-pay

## 2022-05-06 ENCOUNTER — Ambulatory Visit: Payer: Medicare HMO | Admitting: Family Medicine

## 2022-05-13 ENCOUNTER — Ambulatory Visit (INDEPENDENT_AMBULATORY_CARE_PROVIDER_SITE_OTHER): Payer: Medicare HMO | Admitting: Family Medicine

## 2022-05-13 ENCOUNTER — Encounter: Payer: Self-pay | Admitting: Family Medicine

## 2022-05-13 VITALS — BP 110/66 | HR 75 | Temp 97.5°F | Ht 61.75 in | Wt 172.0 lb

## 2022-05-13 DIAGNOSIS — I10 Essential (primary) hypertension: Secondary | ICD-10-CM

## 2022-05-13 DIAGNOSIS — M8580 Other specified disorders of bone density and structure, unspecified site: Secondary | ICD-10-CM | POA: Diagnosis not present

## 2022-05-13 DIAGNOSIS — E669 Obesity, unspecified: Secondary | ICD-10-CM

## 2022-05-13 DIAGNOSIS — N1832 Chronic kidney disease, stage 3b: Secondary | ICD-10-CM

## 2022-05-13 DIAGNOSIS — M5416 Radiculopathy, lumbar region: Secondary | ICD-10-CM

## 2022-05-13 DIAGNOSIS — E782 Mixed hyperlipidemia: Secondary | ICD-10-CM | POA: Diagnosis not present

## 2022-05-13 MED ORDER — DICLOFENAC SODIUM 1 % EX GEL: 2.0000 g | Freq: Four times a day (QID) | CUTANEOUS | 3 refills | Status: DC

## 2022-05-13 MED ORDER — GABAPENTIN 300 MG PO CAPS: 300.0000 mg | ORAL_CAPSULE | Freq: Three times a day (TID) | ORAL | 1 refills | Status: DC

## 2022-05-13 MED ORDER — CYCLOBENZAPRINE HCL 5 MG PO TABS: 5.0000 mg | ORAL_TABLET | Freq: Every evening | ORAL | 1 refills | Status: DC | PRN
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
## 2022-05-31 DIAGNOSIS — M5416 Radiculopathy, lumbar region: Secondary | ICD-10-CM | POA: Diagnosis not present

## 2022-06-01 DIAGNOSIS — R891 Abnormal level of hormones in specimens from other organs, systems and tissues: Secondary | ICD-10-CM | POA: Diagnosis not present

## 2022-06-01 DIAGNOSIS — I129 Hypertensive chronic kidney disease with stage 1 through stage 4 chronic kidney disease, or unspecified chronic kidney disease: Secondary | ICD-10-CM | POA: Diagnosis not present

## 2022-06-01 DIAGNOSIS — N1832 Chronic kidney disease, stage 3b: Secondary | ICD-10-CM | POA: Diagnosis not present

## 2022-06-01 DIAGNOSIS — D519 Vitamin B12 deficiency anemia, unspecified: Secondary | ICD-10-CM | POA: Diagnosis not present

## 2022-06-01 DIAGNOSIS — E6609 Other obesity due to excess calories: Secondary | ICD-10-CM | POA: Diagnosis not present

## 2022-06-08 DIAGNOSIS — I129 Hypertensive chronic kidney disease with stage 1 through stage 4 chronic kidney disease, or unspecified chronic kidney disease: Secondary | ICD-10-CM | POA: Diagnosis not present

## 2022-06-08 DIAGNOSIS — E271 Primary adrenocortical insufficiency: Secondary | ICD-10-CM | POA: Diagnosis not present

## 2022-06-08 DIAGNOSIS — E6609 Other obesity due to excess calories: Secondary | ICD-10-CM | POA: Diagnosis not present

## 2022-06-08 DIAGNOSIS — N1832 Chronic kidney disease, stage 3b: Secondary | ICD-10-CM | POA: Diagnosis not present

## 2022-07-18 DIAGNOSIS — M5416 Radiculopathy, lumbar region: Secondary | ICD-10-CM | POA: Diagnosis not present

## 2022-07-24 DIAGNOSIS — H9202 Otalgia, left ear: Secondary | ICD-10-CM | POA: Diagnosis not present

## 2022-07-24 DIAGNOSIS — J019 Acute sinusitis, unspecified: Secondary | ICD-10-CM | POA: Diagnosis not present

## 2022-09-07 DIAGNOSIS — N1832 Chronic kidney disease, stage 3b: Secondary | ICD-10-CM | POA: Diagnosis not present

## 2022-09-07 DIAGNOSIS — E271 Primary adrenocortical insufficiency: Secondary | ICD-10-CM | POA: Diagnosis not present

## 2022-09-07 DIAGNOSIS — E6609 Other obesity due to excess calories: Secondary | ICD-10-CM | POA: Diagnosis not present

## 2022-09-07 DIAGNOSIS — I129 Hypertensive chronic kidney disease with stage 1 through stage 4 chronic kidney disease, or unspecified chronic kidney disease: Secondary | ICD-10-CM | POA: Diagnosis not present

## 2022-09-19 DIAGNOSIS — M419 Scoliosis, unspecified: Secondary | ICD-10-CM | POA: Diagnosis not present

## 2022-09-19 DIAGNOSIS — Z6831 Body mass index (BMI) 31.0-31.9, adult: Secondary | ICD-10-CM | POA: Diagnosis not present

## 2022-09-19 DIAGNOSIS — M5416 Radiculopathy, lumbar region: Secondary | ICD-10-CM | POA: Diagnosis not present

## 2022-09-21 DIAGNOSIS — E271 Primary adrenocortical insufficiency: Secondary | ICD-10-CM | POA: Diagnosis not present

## 2022-09-21 DIAGNOSIS — N1832 Chronic kidney disease, stage 3b: Secondary | ICD-10-CM | POA: Diagnosis not present

## 2022-09-21 DIAGNOSIS — D638 Anemia in other chronic diseases classified elsewhere: Secondary | ICD-10-CM | POA: Diagnosis not present

## 2022-09-21 DIAGNOSIS — I129 Hypertensive chronic kidney disease with stage 1 through stage 4 chronic kidney disease, or unspecified chronic kidney disease: Secondary | ICD-10-CM | POA: Diagnosis not present

## 2022-09-28 ENCOUNTER — Telehealth: Payer: Self-pay

## 2022-09-28 NOTE — Telephone Encounter (Signed)
Surgical clearance forms received on 09/28/22. Patient has been scheduled on n/a to surgical clearance appt. Forms have been placed on PCP desk.   LVM for pt to call to schedule appt

## 2022-09-29 NOTE — Telephone Encounter (Signed)
LM for pt to return call to schedule.

## 2022-09-29 NOTE — Telephone Encounter (Signed)
Pt called back. Please call patient back to discuss referral information.

## 2022-09-30 NOTE — Telephone Encounter (Signed)
Pt called back and ask that Kathryn Lynn can return her call. Her phone had this number blocked.

## 2022-09-30 NOTE — Telephone Encounter (Signed)
Pt scheduled  

## 2022-10-03 ENCOUNTER — Other Ambulatory Visit: Payer: Self-pay | Admitting: Family Medicine

## 2022-10-04 ENCOUNTER — Ambulatory Visit (INDEPENDENT_AMBULATORY_CARE_PROVIDER_SITE_OTHER): Payer: Medicare HMO | Admitting: Family Medicine

## 2022-10-04 ENCOUNTER — Encounter: Payer: Self-pay | Admitting: Family Medicine

## 2022-10-04 VITALS — BP 116/76 | HR 80 | Temp 97.6°F | Wt 173.8 lb

## 2022-10-04 DIAGNOSIS — I10 Essential (primary) hypertension: Secondary | ICD-10-CM

## 2022-10-04 DIAGNOSIS — M8580 Other specified disorders of bone density and structure, unspecified site: Secondary | ICD-10-CM

## 2022-10-04 DIAGNOSIS — M9979 Connective tissue and disc stenosis of intervertebral foramina of abdomen and other regions: Secondary | ICD-10-CM

## 2022-10-04 DIAGNOSIS — E669 Obesity, unspecified: Secondary | ICD-10-CM

## 2022-10-04 DIAGNOSIS — M519 Unspecified thoracic, thoracolumbar and lumbosacral intervertebral disc disorder: Secondary | ICD-10-CM

## 2022-10-04 DIAGNOSIS — N1832 Chronic kidney disease, stage 3b: Secondary | ICD-10-CM | POA: Diagnosis not present

## 2022-10-04 DIAGNOSIS — Z01818 Encounter for other preprocedural examination: Secondary | ICD-10-CM

## 2022-10-04 DIAGNOSIS — Z79899 Other long term (current) drug therapy: Secondary | ICD-10-CM | POA: Diagnosis not present

## 2022-10-04 LAB — COMPREHENSIVE METABOLIC PANEL
ALT: 12 U/L (ref 0–35)
AST: 16 U/L (ref 0–37)
Albumin: 4.2 g/dL (ref 3.5–5.2)
Alkaline Phosphatase: 51 U/L (ref 39–117)
BUN: 23 mg/dL (ref 6–23)
CO2: 31 mEq/L (ref 19–32)
Calcium: 9 mg/dL (ref 8.4–10.5)
Chloride: 102 mEq/L (ref 96–112)
Creatinine, Ser: 1.6 mg/dL — ABNORMAL HIGH (ref 0.40–1.20)
GFR: 32.3 mL/min — ABNORMAL LOW (ref >60.00)
Glucose, Bld: 87 mg/dL (ref 70–99)
Potassium: 4.5 mEq/L (ref 3.5–5.1)
Sodium: 139 mEq/L (ref 135–145)
Total Bilirubin: 1 mg/dL (ref 0.2–1.2)
Total Protein: 6.5 g/dL (ref 6.0–8.3)

## 2022-10-04 LAB — CBC WITH DIFFERENTIAL/PLATELET
Basophils Absolute: 0 10*3/uL (ref 0.0–0.1)
Basophils Relative: 0.6 % (ref 0.0–3.0)
Eosinophils Absolute: 0.2 10*3/uL (ref 0.0–0.7)
Eosinophils Relative: 3.3 % (ref 0.0–5.0)
HCT: 35.9 % — ABNORMAL LOW (ref 36.0–46.0)
Hemoglobin: 11.7 g/dL — ABNORMAL LOW (ref 12.0–15.0)
Lymphocytes Relative: 39 % (ref 12.0–46.0)
Lymphs Abs: 1.9 10*3/uL (ref 0.7–4.0)
MCHC: 32.5 g/dL (ref 30.0–36.0)
MCV: 80 fl (ref 78.0–100.0)
Monocytes Absolute: 0.4 10*3/uL (ref 0.1–1.0)
Monocytes Relative: 8.3 % (ref 3.0–12.0)
Neutro Abs: 2.4 10*3/uL (ref 1.4–7.7)
Neutrophils Relative %: 48.8 % (ref 43.0–77.0)
Platelets: 236 10*3/uL (ref 150.0–400.0)
RBC: 4.49 Mil/uL (ref 3.87–5.11)
RDW: 14.7 % (ref 11.5–15.5)
WBC: 4.8 10*3/uL (ref 4.0–10.5)

## 2022-10-04 LAB — HEMOGLOBIN A1C: Hgb A1c MFr Bld: 6 % (ref 4.6–6.5)

## 2022-10-04 NOTE — Patient Instructions (Signed)
No follow-ups on file.        Great to see you today.  I have refilled the medication(s) we provide.   If labs were collected, we will inform you of lab results once received either by echart message or telephone call.   - echart message- for normal results that have been seen by the patient already.   - telephone call: abnormal results or if patient has not viewed results in their echart.   Make sure to stop all OTC supplements and vitamins at least 2 weeks prior to surgery.
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
## 2022-10-05 NOTE — Telephone Encounter (Signed)
Preoperative risk assessment completed and placed in CMA work basket. Please fax form with recent labs and office visit note to [facility]. Thanks

## 2022-10-06 NOTE — Telephone Encounter (Signed)
Faxed on 10/05/22

## 2022-10-12 ENCOUNTER — Other Ambulatory Visit: Payer: Self-pay | Admitting: Neurological Surgery

## 2022-10-13 ENCOUNTER — Other Ambulatory Visit: Payer: Self-pay | Admitting: Family Medicine

## 2022-10-14 ENCOUNTER — Other Ambulatory Visit: Payer: Self-pay

## 2022-10-18 ENCOUNTER — Encounter: Payer: Self-pay | Admitting: Vascular Surgery

## 2022-10-18 ENCOUNTER — Ambulatory Visit: Payer: Medicare HMO | Admitting: Vascular Surgery

## 2022-10-18 VITALS — BP 138/70 | HR 86 | Temp 98.4°F | Resp 16 | Ht 62.0 in | Wt 173.0 lb

## 2022-10-18 DIAGNOSIS — M5416 Radiculopathy, lumbar region: Secondary | ICD-10-CM

## 2022-10-18 DIAGNOSIS — M545 Low back pain, unspecified: Secondary | ICD-10-CM | POA: Diagnosis not present
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
## 2022-10-24 ENCOUNTER — Other Ambulatory Visit: Payer: Self-pay

## 2022-10-31 ENCOUNTER — Other Ambulatory Visit: Payer: Self-pay | Admitting: Family Medicine

## 2022-10-31 NOTE — Pre-Procedure Instructions (Signed)
Surgical Instructions    Your procedure is scheduled on Wednesday, December 6.  Report to [facility] Main Entrance "A" at 6:30 A.M., then check in with the Admitting office.  Call this number if you have problems the morning of surgery:  [phone]   If you have any questions prior to your surgery date call [phone]: Open Monday-Friday 8am-4pm If you experience any cold or flu symptoms such as cough, fever, chills, shortness of breath, etc. between now and your scheduled surgery, please notify us at the above number     Remember:  Do not eat or drink after midnight the night before your surgery     Take these medicines the morning of surgery with A SIP OF WATER:  cetirizine (ZYRTEC)  fluticasone (FLONASE) 50 MCG/ACT nasal spray  gabapentin (NEURONTIN)  cyclobenzaprine (FLEXERIL)  If needed meclizine (ANTIVERT)  if needed traMADol (ULTRAM)  if needed  As of today, STOP taking any Aspirin (unless otherwise instructed by your surgeon) Aleve, Naproxen, Ibuprofen, Motrin, Advil, Goody's, BC's, all herbal medications, fish oil, and all vitamins.            [facility] is not responsible for any belongings or valuables.    Do NOT Smoke (Tobacco/Vaping)  24 hours prior to your procedure  If you use a CPAP at night, you may bring your mask for your overnight stay.   Contacts, glasses, hearing aids, dentures or partials may not be worn into surgery, please bring cases for these belongings   For patients admitted to the hospital, discharge time will be determined by your treatment team.   Patients discharged the day of surgery will not be allowed to drive home, and someone needs to stay with them for 24 hours.   SURGICAL WAITING ROOM VISITATION Patients having surgery or a procedure may have no more than 2 support people in the waiting area - these visitors may rotate.   Children under the age of 21 must have an adult with them who is not the patient. If the patient needs to  stay at the hospital during part of their recovery, the visitor guidelines for inpatient rooms apply. Pre-op nurse will coordinate an appropriate time for 1 support person to accompany patient in pre-op.  This support person may not rotate.   Please refer to [email or web address] for the visitor guidelines for Inpatients (after your surgery is over and you are in a regular room).    Special instructions:    Oral Hygiene is also important to reduce your risk of infection.  Remember - BRUSH YOUR TEETH THE MORNING OF SURGERY WITH YOUR REGULAR TOOTHPASTE   - Preparing For Surgery  Before surgery, you can play an important role. Because skin is not sterile, your skin needs to be as free of germs as possible. You can reduce the number of germs on your skin by washing with CHG (chlorahexidine gluconate) Soap before surgery.  CHG is an antiseptic cleaner which kills germs and bonds with the skin to continue killing germs even after washing.     Please do not use if you have an allergy to CHG or antibacterial soaps. If your skin becomes reddened/irritated stop using the CHG.  Do not shave (including legs and underarms) for at least 48 hours prior to first CHG shower. It is OK to shave your face.  Please follow these instructions carefully.     Shower the NIGHT BEFORE SURGERY and the MORNING OF SURGERY with CHG Soap.   If you  chose to wash your hair, wash your hair first as usual with your normal shampoo. After you shampoo, rinse your hair and body thoroughly to remove the shampoo.  Then [employer] and genitals (private parts) with your normal soap and rinse thoroughly to remove soap.  After that Use CHG Soap as you would any other liquid soap. You can apply CHG directly to the skin and wash gently with a scrungie or a clean washcloth.   Apply the CHG Soap to your body ONLY FROM THE NECK DOWN.  Do not use on open wounds or open sores. Avoid  contact with your eyes, ears, mouth and genitals (private parts). Wash Face and genitals (private parts)  with your normal soap.   Wash thoroughly, paying special attention to the area where your surgery will be performed.  Thoroughly rinse your body with warm water from the neck down.  DO NOT shower/wash with your normal soap after using and rinsing off the CHG Soap.  Pat yourself dry with a CLEAN TOWEL.  Wear CLEAN PAJAMAS to bed the night before surgery  Place CLEAN SHEETS on your bed the night before your surgery  DO NOT SLEEP WITH PETS.   Day of Surgery:  Take a shower with CHG soap. Wear Clean/Comfortable clothing the morning of surgery Do not wear jewelry or makeup. Do not wear lotions, powders, perfumes/cologne or deodorant. Do not shave 48 hours prior to surgery.  Men may shave face and neck. Do not bring valuables to the hospital. Do not wear nail polish, gel polish, artificial nails, or any other type of covering on natural nails (fingers and toes) If you have artificial nails or gel coating that need to be removed by a nail salon, please have this removed prior to surgery. Artificial nails or gel coating may interfere with anesthesia's ability to adequately monitor your vital signs.  Remember to brush your teeth WITH YOUR REGULAR TOOTHPASTE.    If you received a COVID test during your pre-op visit, it is requested that you wear a mask when out in public, stay away from anyone that may not be feeling well, and notify your surgeon if you develop symptoms. If you have been in contact with anyone that has tested positive in the last 10 days, please notify your surgeon.    Please read over the following fact sheets that you were given.

## 2022-11-01 ENCOUNTER — Encounter (HOSPITAL_COMMUNITY): Payer: Self-pay

## 2022-11-01 ENCOUNTER — Other Ambulatory Visit: Payer: Self-pay

## 2022-11-01 ENCOUNTER — Encounter (HOSPITAL_COMMUNITY)
Admission: RE | Admit: 2022-11-01 | Discharge: 2022-11-01 | Disposition: A | Discharge status: Home / Self Care | Payer: Medicare HMO | Source: Ambulatory Visit | Attending: Neurological Surgery | Admitting: Neurological Surgery

## 2022-11-01 VITALS — BP 136/72 | HR 91 | Temp 97.8°F | Resp 17 | Ht 62.0 in | Wt 173.4 lb

## 2022-11-01 DIAGNOSIS — I10 Essential (primary) hypertension: Secondary | ICD-10-CM | POA: Diagnosis not present

## 2022-11-01 DIAGNOSIS — Z01818 Encounter for other preprocedural examination: Secondary | ICD-10-CM | POA: Diagnosis not present

## 2022-11-01 LAB — CBC
HCT: 41.2 % (ref 36.0–46.0)
Hemoglobin: 12.5 g/dL (ref 12.0–15.0)
MCH: 25.2 pg — ABNORMAL LOW (ref 26.0–34.0)
MCHC: 30.3 g/dL (ref 30.0–36.0)
MCV: 82.9 fL (ref 80.0–100.0)
Platelets: 210 10*3/uL (ref 150–400)
RBC: 4.97 MIL/uL (ref 3.87–5.11)
RDW: 14.5 % (ref 11.5–15.5)
WBC: 5.9 10*3/uL (ref 4.0–10.5)
nRBC: 0 % (ref 0.0–0.2)

## 2022-11-01 LAB — BASIC METABOLIC PANEL
Anion gap: 12 (ref 5–15)
BUN: 17 mg/dL (ref 8–23)
CO2: 26 mmol/L (ref 22–32)
Calcium: 9.2 mg/dL (ref 8.9–10.3)
Chloride: 103 mmol/L (ref 98–111)
Creatinine, Ser: 1.62 mg/dL — ABNORMAL HIGH (ref 0.44–1.00)
GFR, Estimated: 34 mL/min — ABNORMAL LOW (ref >60)
Glucose, Bld: 89 mg/dL (ref 70–99)
Potassium: 4.3 mmol/L (ref 3.5–5.1)
Sodium: 141 mmol/L (ref 135–145)

## 2022-11-01 LAB — TYPE AND SCREEN
ABO/RH(D): O POS
Antibody Screen: NEGATIVE

## 2022-11-01 LAB — SURGICAL PCR SCREEN
MRSA, PCR: NEGATIVE
Staphylococcus aureus: NEGATIVE

## 2022-11-01 NOTE — Progress Notes (Signed)
PCP: Dr. Felix Pacini, clearance note in EPIC Cardiologist: Denies  EKG: Today CXR: n/a ECHO: 05/10/2011 Stress Test: 05/10/2011 Cardiac Cath: denies  ERAS: NPO   Patient denies shortness of breath, fever, cough, and chest pain at PAT appointment.  Patient verbalized understanding of instructions provided today at the PAT appointment.  Patient asked to review instructions at home and day of surgery.

## 2022-11-08 NOTE — Anesthesia Preprocedure Evaluation (Signed)
Anesthesia Evaluation  Patient identified by MRN, date of birth, ID band Patient awake    Reviewed: Allergy & Precautions, H&P , NPO status , Patient's Chart, lab work & pertinent test results  Airway Mallampati: III  TM Distance: >3 FB Neck ROM: Full    Dental no notable dental hx. (+) Teeth Intact, Dental Advisory Given   Pulmonary neg pulmonary ROS   Pulmonary exam normal breath sounds clear to auscultation       Cardiovascular Exercise Tolerance: Good hypertension,  Rhythm:Regular Rate:Normal     Neuro/Psych negative neurological ROS  negative psych ROS   GI/Hepatic Neg liver ROS,GERD  ,,  Endo/Other  negative endocrine ROS    Renal/GU Renal InsufficiencyRenal disease  negative genitourinary   Musculoskeletal  (+) Arthritis , Osteoarthritis,    Abdominal   Peds  Hematology  (+) Blood dyscrasia, anemia   Anesthesia Other Findings   Reproductive/Obstetrics negative OB ROS                             Anesthesia Physical Anesthesia Plan  ASA: 2  Anesthesia Plan: General   Post-op Pain Management: Tylenol PO (pre-op)* and Ketamine IV*   Induction: Intravenous  PONV Risk Score and Plan: 4 or greater and Ondansetron and Dexamethasone  Airway Management Planned: Oral ETT  Additional Equipment: Arterial line  Intra-op Plan:   Post-operative Plan: Possible Post-op intubation/ventilation  Informed Consent: I have reviewed the patients History and Physical, chart, labs and discussed the procedure including the risks, benefits and alternatives for the proposed anesthesia with the patient or authorized representative who has indicated his/her understanding and acceptance.     Dental advisory given  Plan Discussed with: CRNA  Anesthesia Plan Comments:        Anesthesia Quick Evaluation

## 2022-11-09 ENCOUNTER — Other Ambulatory Visit: Payer: Self-pay

## 2022-11-09 ENCOUNTER — Encounter (HOSPITAL_COMMUNITY): Payer: Self-pay | Admitting: Neurological Surgery

## 2022-11-09 ENCOUNTER — Inpatient Hospital Stay (HOSPITAL_COMMUNITY): Payer: Medicare HMO | Admitting: Anesthesiology

## 2022-11-09 ENCOUNTER — Inpatient Hospital Stay (HOSPITAL_COMMUNITY): Payer: Medicare HMO

## 2022-11-09 ENCOUNTER — Inpatient Hospital Stay (HOSPITAL_COMMUNITY)
Admission: RE | Admit: 2022-11-09 | Discharge: 2022-11-18 | DRG: 455 | Disposition: A | Discharge status: Skilled Nursing Facility | Payer: Medicare HMO | Attending: Neurological Surgery | Admitting: Neurological Surgery

## 2022-11-09 ENCOUNTER — Encounter (HOSPITAL_COMMUNITY)
Admission: RE | Disposition: A | Discharge status: Skilled Nursing Facility | Payer: Self-pay | Source: Home / Self Care | Attending: Neurological Surgery

## 2022-11-09 DIAGNOSIS — G8929 Other chronic pain: Secondary | ICD-10-CM | POA: Diagnosis present

## 2022-11-09 DIAGNOSIS — I10 Essential (primary) hypertension: Secondary | ICD-10-CM | POA: Diagnosis present

## 2022-11-09 DIAGNOSIS — N1831 Chronic kidney disease, stage 3a: Secondary | ICD-10-CM | POA: Diagnosis not present

## 2022-11-09 DIAGNOSIS — Z79891 Long term (current) use of opiate analgesic: Secondary | ICD-10-CM

## 2022-11-09 DIAGNOSIS — E782 Mixed hyperlipidemia: Secondary | ICD-10-CM | POA: Diagnosis not present

## 2022-11-09 DIAGNOSIS — M4157 Other secondary scoliosis, lumbosacral region: Secondary | ICD-10-CM | POA: Diagnosis present

## 2022-11-09 DIAGNOSIS — M4807 Spinal stenosis, lumbosacral region: Secondary | ICD-10-CM | POA: Diagnosis not present

## 2022-11-09 DIAGNOSIS — M4726 Other spondylosis with radiculopathy, lumbar region: Secondary | ICD-10-CM | POA: Diagnosis present

## 2022-11-09 DIAGNOSIS — Z888 Allergy status to other drugs, medicaments and biological substances status: Secondary | ICD-10-CM

## 2022-11-09 DIAGNOSIS — M48061 Spinal stenosis, lumbar region without neurogenic claudication: Principal | ICD-10-CM | POA: Diagnosis present

## 2022-11-09 DIAGNOSIS — Z9049 Acquired absence of other specified parts of digestive tract: Secondary | ICD-10-CM

## 2022-11-09 DIAGNOSIS — M1990 Unspecified osteoarthritis, unspecified site: Secondary | ICD-10-CM | POA: Diagnosis present

## 2022-11-09 DIAGNOSIS — Z881 Allergy status to other antibiotic agents status: Secondary | ICD-10-CM | POA: Diagnosis not present

## 2022-11-09 DIAGNOSIS — Z4789 Encounter for other orthopedic aftercare: Secondary | ICD-10-CM | POA: Diagnosis not present

## 2022-11-09 DIAGNOSIS — R531 Weakness: Secondary | ICD-10-CM | POA: Diagnosis not present

## 2022-11-09 DIAGNOSIS — Z91040 Latex allergy status: Secondary | ICD-10-CM | POA: Diagnosis not present

## 2022-11-09 DIAGNOSIS — Z79899 Other long term (current) drug therapy: Secondary | ICD-10-CM

## 2022-11-09 DIAGNOSIS — R29898 Other symptoms and signs involving the musculoskeletal system: Secondary | ICD-10-CM | POA: Diagnosis not present

## 2022-11-09 DIAGNOSIS — Z90722 Acquired absence of ovaries, bilateral: Secondary | ICD-10-CM | POA: Diagnosis not present

## 2022-11-09 DIAGNOSIS — M4187 Other forms of scoliosis, lumbosacral region: Secondary | ICD-10-CM | POA: Diagnosis not present

## 2022-11-09 DIAGNOSIS — M4316 Spondylolisthesis, lumbar region: Secondary | ICD-10-CM | POA: Diagnosis not present

## 2022-11-09 DIAGNOSIS — Z90710 Acquired absence of both cervix and uterus: Secondary | ICD-10-CM

## 2022-11-09 DIAGNOSIS — Z4782 Encounter for orthopedic aftercare following scoliosis surgery: Secondary | ICD-10-CM | POA: Diagnosis not present

## 2022-11-09 DIAGNOSIS — Z9079 Acquired absence of other genital organ(s): Secondary | ICD-10-CM

## 2022-11-09 DIAGNOSIS — Z981 Arthrodesis status: Secondary | ICD-10-CM | POA: Diagnosis not present

## 2022-11-09 DIAGNOSIS — D649 Anemia, unspecified: Secondary | ICD-10-CM | POA: Diagnosis not present

## 2022-11-09 DIAGNOSIS — M419 Scoliosis, unspecified: Secondary | ICD-10-CM | POA: Diagnosis not present

## 2022-11-09 DIAGNOSIS — Z8249 Family history of ischemic heart disease and other diseases of the circulatory system: Secondary | ICD-10-CM

## 2022-11-09 DIAGNOSIS — M5116 Intervertebral disc disorders with radiculopathy, lumbar region: Secondary | ICD-10-CM | POA: Diagnosis not present

## 2022-11-09 DIAGNOSIS — Z7401 Bed confinement status: Secondary | ICD-10-CM | POA: Diagnosis not present

## 2022-11-09 DIAGNOSIS — M4126 Other idiopathic scoliosis, lumbar region: Secondary | ICD-10-CM | POA: Diagnosis not present

## 2022-11-09 DIAGNOSIS — K219 Gastro-esophageal reflux disease without esophagitis: Secondary | ICD-10-CM | POA: Diagnosis not present

## 2022-11-09 DIAGNOSIS — M4186 Other forms of scoliosis, lumbar region: Secondary | ICD-10-CM | POA: Diagnosis not present

## 2022-11-09 DIAGNOSIS — Z880 Allergy status to penicillin: Secondary | ICD-10-CM

## 2022-11-09 HISTORY — PX: ANTERIOR LAT LUMBAR FUSION: SHX1168

## 2022-11-09 HISTORY — PX: ANTERIOR LUMBAR FUSION: SHX1170

## 2022-11-09 HISTORY — PX: ABDOMINAL EXPOSURE: SHX5708

## 2022-11-09 LAB — POCT I-STAT 7, (LYTES, BLD GAS, ICA,H+H)
Acid-base deficit: 1 mmol/L (ref 0.0–2.0)
Acid-base deficit: 2 mmol/L (ref 0.0–2.0)
Acid-base deficit: 5 mmol/L — ABNORMAL HIGH (ref 0.0–2.0)
Bicarbonate: 22.7 mmol/L (ref 20.0–28.0)
Bicarbonate: 23.5 mmol/L (ref 20.0–28.0)
Bicarbonate: 21 mmol/L (ref 20.0–28.0)
Calcium, Ion: 1.15 mmol/L (ref 1.15–1.40)
Calcium, Ion: 1.1 mmol/L — ABNORMAL LOW (ref 1.15–1.40)
Calcium, Ion: 1.06 mmol/L — ABNORMAL LOW (ref 1.15–1.40)
HCT: 29 % — ABNORMAL LOW (ref 36.0–46.0)
HCT: 29 % — ABNORMAL LOW (ref 36.0–46.0)
HCT: 30 % — ABNORMAL LOW (ref 36.0–46.0)
Hemoglobin: 9.9 g/dL — ABNORMAL LOW (ref 12.0–15.0)
Hemoglobin: 9.9 g/dL — ABNORMAL LOW (ref 12.0–15.0)
Hemoglobin: 10.2 g/dL — ABNORMAL LOW (ref 12.0–15.0)
O2 Saturation: 100 %
O2 Saturation: 100 %
O2 Saturation: 100 %
Patient temperature: 35.2
Patient temperature: 36.1
Patient temperature: 36
Potassium: 4.2 mmol/L (ref 3.5–5.1)
Potassium: 4.6 mmol/L (ref 3.5–5.1)
Potassium: 5 mmol/L (ref 3.5–5.1)
Sodium: 138 mmol/L (ref 135–145)
Sodium: 136 mmol/L (ref 135–145)
Sodium: 136 mmol/L (ref 135–145)
TCO2: 24 mmol/L (ref 22–32)
TCO2: 25 mmol/L (ref 22–32)
TCO2: 22 mmol/L (ref 22–32)
pCO2 arterial: 30.4 mmHg — ABNORMAL LOW (ref 32–48)
pCO2 arterial: 38.5 mmHg (ref 32–48)
pCO2 arterial: 39.2 mmHg (ref 32–48)
pH, Arterial: 7.473 — ABNORMAL HIGH (ref 7.35–7.45)
pH, Arterial: 7.391 (ref 7.35–7.45)
pH, Arterial: 7.332 — ABNORMAL LOW (ref 7.35–7.45)
pO2, Arterial: 278 mmHg — ABNORMAL HIGH (ref 83–108)
pO2, Arterial: 253 mmHg — ABNORMAL HIGH (ref 83–108)
pO2, Arterial: 266 mmHg — ABNORMAL HIGH (ref 83–108)

## 2022-11-09 LAB — ABO/RH: ABO/RH(D): O POS

## 2022-11-09 SURGERY — ANTERIOR LUMBAR FUSION 1 LEVEL
Anesthesia: General | Site: Spine Lumbar | Laterality: Right

## 2022-11-09 MED ORDER — ARTIFICIAL TEARS OPHTHALMIC OINT
TOPICAL_OINTMENT | OPHTHALMIC | Status: DC | PRN
  Administered 2022-11-09: 1 via OPHTHALMIC

## 2022-11-09 MED ORDER — PROPOFOL 500 MG/50ML IV EMUL
INTRAVENOUS | Status: DC | PRN
  Administered 2022-11-09 (×2): 50 ug/kg/min via INTRAVENOUS

## 2022-11-09 MED ORDER — PHENYLEPHRINE HCL-NACL 20-0.9 MG/250ML-% IV SOLN
INTRAVENOUS | Status: DC | PRN
  Administered 2022-11-09: 25 ug/min via INTRAVENOUS

## 2022-11-09 MED ORDER — DEXAMETHASONE SODIUM PHOSPHATE 10 MG/ML IJ SOLN
INTRAMUSCULAR | Status: DC | PRN
  Administered 2022-11-09: 10 mg via INTRAVENOUS

## 2022-11-09 MED ORDER — PHENOL 1.4 % MT LIQD: 1.0000 | OROMUCOSAL | Status: DC | PRN

## 2022-11-09 MED ORDER — LACTATED RINGERS IV SOLN: INTRAVENOUS | Status: DC | PRN

## 2022-11-09 MED ORDER — CHLORHEXIDINE GLUCONATE CLOTH 2 % EX PADS: 6.0000 | MEDICATED_PAD | Freq: Once | CUTANEOUS | Status: DC

## 2022-11-09 MED ORDER — BACITRACIN ZINC 500 UNIT/GM EX OINT
TOPICAL_OINTMENT | CUTANEOUS | Status: DC | PRN
  Administered 2022-11-09: 1 via TOPICAL

## 2022-11-09 MED ORDER — FENTANYL CITRATE (PF) 250 MCG/5ML IJ SOLN
INTRAMUSCULAR | Status: AC
  Filled 2022-11-09: qty 5

## 2022-11-09 MED ORDER — ONDANSETRON HCL 4 MG PO TABS: 4.0000 mg | ORAL_TABLET | Freq: Four times a day (QID) | ORAL | Status: DC | PRN

## 2022-11-09 MED ORDER — BACITRACIN ZINC 500 UNIT/GM EX OINT
TOPICAL_OINTMENT | CUTANEOUS | Status: AC
  Filled 2022-11-09: qty 28.35

## 2022-11-09 MED ORDER — FENTANYL CITRATE (PF) 250 MCG/5ML IJ SOLN
INTRAMUSCULAR | Status: DC | PRN
  Administered 2022-11-09 (×7): 50 ug via INTRAVENOUS
  Administered 2022-11-09: 100 ug via INTRAVENOUS
  Administered 2022-11-09: 50 ug via INTRAVENOUS

## 2022-11-09 MED ORDER — ACETAMINOPHEN 10 MG/ML IV SOLN
INTRAVENOUS | Status: AC
  Filled 2022-11-09: qty 100

## 2022-11-09 MED ORDER — SODIUM CHLORIDE 0.9% FLUSH
3.0000 mL | Freq: Two times a day (BID) | INTRAVENOUS | Status: DC
  Administered 2022-11-09 – 2022-11-18 (×17): 3 mL via INTRAVENOUS

## 2022-11-09 MED ORDER — SODIUM CHLORIDE 0.9 % IV SOLN: 250.0000 mL | INTRAVENOUS | Status: DC

## 2022-11-09 MED ORDER — PHENYLEPHRINE 80 MCG/ML (10ML) SYRINGE FOR IV PUSH (FOR BLOOD PRESSURE SUPPORT)
PREFILLED_SYRINGE | INTRAVENOUS | Status: DC | PRN
  Administered 2022-11-09 (×3): 80 ug via INTRAVENOUS

## 2022-11-09 MED ORDER — MIDAZOLAM HCL 5 MG/5ML IJ SOLN
INTRAMUSCULAR | Status: DC | PRN
  Administered 2022-11-09: 2 mg via INTRAVENOUS

## 2022-11-09 MED ORDER — ORAL CARE MOUTH RINSE: 15.0000 mL | Freq: Once | OROMUCOSAL | Status: AC

## 2022-11-09 MED ORDER — SODIUM CHLORIDE 0.9 % IV SOLN: INTRAVENOUS | Status: DC

## 2022-11-09 MED ORDER — MECLIZINE HCL 25 MG PO TABS: 25.0000 mg | ORAL_TABLET | Freq: Three times a day (TID) | ORAL | Status: DC | PRN

## 2022-11-09 MED ORDER — VANCOMYCIN HCL IN DEXTROSE 1-5 GM/200ML-% IV SOLN
INTRAVENOUS | Status: AC
  Filled 2022-11-09: qty 200

## 2022-11-09 MED ORDER — PROPOFOL 10 MG/ML IV BOLUS
INTRAVENOUS | Status: DC | PRN
  Administered 2022-11-09: 50 mg via INTRAVENOUS
  Administered 2022-11-09 (×2): 20 mg via INTRAVENOUS
  Administered 2022-11-09: 10 mg via INTRAVENOUS
  Administered 2022-11-09 (×3): 20 mg via INTRAVENOUS
  Administered 2022-11-09: 100 mg via INTRAVENOUS
  Administered 2022-11-09: 30 mg via INTRAVENOUS

## 2022-11-09 MED ORDER — LIDOCAINE 2% (20 MG/ML) 5 ML SYRINGE
INTRAMUSCULAR | Status: DC | PRN
  Administered 2022-11-09: 60 mg via INTRAVENOUS

## 2022-11-09 MED ORDER — PROPOFOL 10 MG/ML IV BOLUS
INTRAVENOUS | Status: AC
  Filled 2022-11-09: qty 20

## 2022-11-09 MED ORDER — BUPIVACAINE LIPOSOME 1.3 % IJ SUSP
INTRAMUSCULAR | Status: AC
  Filled 2022-11-09: qty 20

## 2022-11-09 MED ORDER — KETAMINE HCL 50 MG/5ML IJ SOSY
PREFILLED_SYRINGE | INTRAMUSCULAR | Status: AC
  Filled 2022-11-09: qty 5

## 2022-11-09 MED ORDER — DOCUSATE SODIUM 100 MG PO CAPS
100.0000 mg | ORAL_CAPSULE | Freq: Two times a day (BID) | ORAL | Status: DC
  Administered 2022-11-09 – 2022-11-18 (×17): 100 mg via ORAL
  Filled 2022-11-09 (×19): qty 1

## 2022-11-09 MED ORDER — VANCOMYCIN HCL IN DEXTROSE 1-5 GM/200ML-% IV SOLN
1000.0000 mg | INTRAVENOUS | Status: AC
  Administered 2022-11-09: 1000 mg via INTRAVENOUS

## 2022-11-09 MED ORDER — ONDANSETRON HCL 4 MG/2ML IJ SOLN: 4.0000 mg | Freq: Four times a day (QID) | INTRAMUSCULAR | Status: DC | PRN

## 2022-11-09 MED ORDER — BUPIVACAINE HCL (PF) 0.5 % IJ SOLN
INTRAMUSCULAR | Status: DC | PRN
  Administered 2022-11-09 (×2): 5 mL

## 2022-11-09 MED ORDER — ACETAMINOPHEN 650 MG RE SUPP: 650.0000 mg | RECTAL | Status: DC | PRN

## 2022-11-09 MED ORDER — SODIUM CHLORIDE 0.9% FLUSH: 3.0000 mL | INTRAVENOUS | Status: DC | PRN

## 2022-11-09 MED ORDER — THROMBIN 20000 UNITS EX SOLR: CUTANEOUS | Status: DC | PRN

## 2022-11-09 MED ORDER — ACETAMINOPHEN 10 MG/ML IV SOLN
INTRAVENOUS | Status: DC | PRN
  Administered 2022-11-09: 1000 mg via INTRAVENOUS

## 2022-11-09 MED ORDER — THROMBIN 5000 UNITS EX SOLR
CUTANEOUS | Status: AC
  Filled 2022-11-09: qty 5000

## 2022-11-09 MED ORDER — 0.9 % SODIUM CHLORIDE (POUR BTL) OPTIME
TOPICAL | Status: DC | PRN
  Administered 2022-11-09: 1000 mL

## 2022-11-09 MED ORDER — LIDOCAINE-EPINEPHRINE 1 %-1:100000 IJ SOLN
INTRAMUSCULAR | Status: AC
  Filled 2022-11-09: qty 1

## 2022-11-09 MED ORDER — MELATONIN 5 MG PO TABS
10.0000 mg | ORAL_TABLET | Freq: Every evening | ORAL | Status: DC | PRN
  Administered 2022-11-12 – 2022-11-17 (×6): 10 mg via ORAL
  Filled 2022-11-09 (×6): qty 2

## 2022-11-09 MED ORDER — ACETAMINOPHEN 500 MG PO TABS: 1000.0000 mg | ORAL_TABLET | Freq: Once | ORAL | Status: AC

## 2022-11-09 MED ORDER — VANCOMYCIN HCL IN DEXTROSE 1-5 GM/200ML-% IV SOLN
1000.0000 mg | INTRAVENOUS | Status: DC
  Administered 2022-11-10 – 2022-11-11 (×2): 1000 mg via INTRAVENOUS
  Filled 2022-11-09 (×3): qty 200

## 2022-11-09 MED ORDER — KETAMINE HCL 10 MG/ML IJ SOLN
INTRAMUSCULAR | Status: DC | PRN
  Administered 2022-11-09: 50 mg via INTRAVENOUS

## 2022-11-09 MED ORDER — METHOCARBAMOL 500 MG PO TABS
500.0000 mg | ORAL_TABLET | Freq: Four times a day (QID) | ORAL | Status: DC | PRN
  Administered 2022-11-10 – 2022-11-17 (×15): 500 mg via ORAL
  Filled 2022-11-09 (×15): qty 1

## 2022-11-09 MED ORDER — ROCURONIUM BROMIDE 10 MG/ML (PF) SYRINGE
PREFILLED_SYRINGE | INTRAVENOUS | Status: DC | PRN
  Administered 2022-11-09: 40 mg via INTRAVENOUS
  Administered 2022-11-09: 50 mg via INTRAVENOUS
  Administered 2022-11-09: 10 mg via INTRAVENOUS
  Administered 2022-11-09: 20 mg via INTRAVENOUS

## 2022-11-09 MED ORDER — LACTATED RINGERS IV SOLN: INTRAVENOUS | Status: DC

## 2022-11-09 MED ORDER — BUPIVACAINE LIPOSOME 1.3 % IJ SUSP
INTRAMUSCULAR | Status: DC | PRN
  Administered 2022-11-09: 20 mL

## 2022-11-09 MED ORDER — HYDROMORPHONE HCL 1 MG/ML IJ SOLN: 0.2500 mg | INTRAMUSCULAR | Status: DC | PRN

## 2022-11-09 MED ORDER — THROMBIN 5000 UNITS EX SOLR: OROMUCOSAL | Status: DC | PRN

## 2022-11-09 MED ORDER — ONDANSETRON HCL 4 MG/2ML IJ SOLN
INTRAMUSCULAR | Status: DC | PRN
  Administered 2022-11-09: 4 mg via INTRAVENOUS

## 2022-11-09 MED ORDER — OXYCODONE HCL 5 MG PO TABS
10.0000 mg | ORAL_TABLET | ORAL | Status: DC | PRN
  Administered 2022-11-10 – 2022-11-18 (×19): 10 mg via ORAL
  Filled 2022-11-09 (×21): qty 2

## 2022-11-09 MED ORDER — LIDOCAINE-EPINEPHRINE 1 %-1:100000 IJ SOLN
INTRAMUSCULAR | Status: DC | PRN
  Administered 2022-11-09 (×2): 5 mL

## 2022-11-09 MED ORDER — DIPHENHYDRAMINE HCL 50 MG/ML IJ SOLN
INTRAMUSCULAR | Status: DC | PRN
  Administered 2022-11-09 (×2): 12.5 mg via INTRAVENOUS

## 2022-11-09 MED ORDER — SUGAMMADEX SODIUM 200 MG/2ML IV SOLN
INTRAVENOUS | Status: DC | PRN
  Administered 2022-11-09: 200 mg via INTRAVENOUS

## 2022-11-09 MED ORDER — METHOCARBAMOL 1000 MG/10ML IJ SOLN
500.0000 mg | Freq: Four times a day (QID) | INTRAVENOUS | Status: DC | PRN
  Filled 2022-11-09: qty 5

## 2022-11-09 MED ORDER — BUPIVACAINE HCL (PF) 0.5 % IJ SOLN
INTRAMUSCULAR | Status: AC
  Filled 2022-11-09: qty 30

## 2022-11-09 MED ORDER — HYDROMORPHONE HCL 1 MG/ML IJ SOLN
1.0000 mg | INTRAMUSCULAR | Status: DC | PRN
  Administered 2022-11-09 – 2022-11-15 (×7): 1 mg via INTRAVENOUS
  Filled 2022-11-09 (×7): qty 1

## 2022-11-09 MED ORDER — PHENYLEPHRINE 80 MCG/ML (10ML) SYRINGE FOR IV PUSH (FOR BLOOD PRESSURE SUPPORT)
PREFILLED_SYRINGE | INTRAVENOUS | Status: AC
  Filled 2022-11-09: qty 30

## 2022-11-09 MED ORDER — CHLORHEXIDINE GLUCONATE 0.12 % MT SOLN
15.0000 mL | Freq: Once | OROMUCOSAL | Status: AC
  Administered 2022-11-09: 15 mL via OROMUCOSAL
  Filled 2022-11-09: qty 15

## 2022-11-09 MED ORDER — ACETAMINOPHEN 500 MG PO TABS
ORAL_TABLET | ORAL | Status: AC
  Administered 2022-11-09: 1000 mg via ORAL
  Filled 2022-11-09: qty 2

## 2022-11-09 MED ORDER — OXYCODONE HCL 5 MG PO TABS
5.0000 mg | ORAL_TABLET | ORAL | Status: DC | PRN
  Administered 2022-11-10 – 2022-11-18 (×10): 5 mg via ORAL
  Filled 2022-11-09 (×11): qty 1

## 2022-11-09 MED ORDER — HYDROMORPHONE HCL 1 MG/ML IJ SOLN
INTRAMUSCULAR | Status: AC
  Filled 2022-11-09: qty 0.5

## 2022-11-09 MED ORDER — DEXMEDETOMIDINE HCL IN NACL 80 MCG/20ML IV SOLN
INTRAVENOUS | Status: DC | PRN
  Administered 2022-11-09: 8 ug via BUCCAL

## 2022-11-09 MED ORDER — MIDAZOLAM HCL 2 MG/2ML IJ SOLN
INTRAMUSCULAR | Status: AC
  Filled 2022-11-09: qty 2

## 2022-11-09 MED ORDER — GABAPENTIN 300 MG PO CAPS
300.0000 mg | ORAL_CAPSULE | Freq: Three times a day (TID) | ORAL | Status: DC
  Administered 2022-11-09 – 2022-11-18 (×27): 300 mg via ORAL
  Filled 2022-11-09 (×27): qty 1

## 2022-11-09 MED ORDER — SIMVASTATIN 20 MG PO TABS
20.0000 mg | ORAL_TABLET | Freq: Every day | ORAL | Status: DC
  Administered 2022-11-10 – 2022-11-17 (×8): 20 mg via ORAL
  Filled 2022-11-09 (×8): qty 1

## 2022-11-09 MED ORDER — MENTHOL 3 MG MT LOZG: 1.0000 | LOZENGE | OROMUCOSAL | Status: DC | PRN

## 2022-11-09 MED ORDER — HYDROMORPHONE HCL 1 MG/ML IJ SOLN
INTRAMUSCULAR | Status: DC | PRN
  Administered 2022-11-09: .5 mg via INTRAVENOUS

## 2022-11-09 MED ORDER — ACETAMINOPHEN 325 MG PO TABS
650.0000 mg | ORAL_TABLET | ORAL | Status: DC | PRN
  Administered 2022-11-10 – 2022-11-15 (×8): 650 mg via ORAL
  Filled 2022-11-09 (×8): qty 2

## 2022-11-09 SURGICAL SUPPLY — 123 items
ANCHOR IMPACTOR GL DISP (ORTHOPEDIC DISPOSABLE SUPPLIES) IMPLANT
ANCHOR LUMBAR 25 MIS (Anchor) IMPLANT
APPLIER CLIP 11 MED OPEN (CLIP) ×6
BAG COUNTER SPONGE SURGICOUNT (BAG) ×9 IMPLANT
BLADE CLIPPER SURG (BLADE) IMPLANT
BUR 14 MATCH 3 (BUR) IMPLANT
BUR CARBIDE MATCH 3.0 (BURR) ×3 IMPLANT
BUR MR8 14 BALL 5 (BUR) IMPLANT
BUR MR8 14 SP MATCH 3 (BUR) IMPLANT
BURR 14 MATCH 3 (BUR)
BURR MR8 14 BALL 5 (BUR)
BURR MR8 14 SP MATCH 3 (BUR) ×3
CABLE BIPOLOR RESECTION CORD (MISCELLANEOUS) IMPLANT
CANISTER SUCT 3000ML PPV (MISCELLANEOUS) ×3 IMPLANT
CLIP APPLIE 11 MED OPEN (CLIP) ×3 IMPLANT
CNTNR URN SCR LID CUP LEK RST (MISCELLANEOUS) ×3 IMPLANT
CONT SPEC 4OZ STRL OR WHT (MISCELLANEOUS) ×3
COUNTER NEEDLE 20 DBL MAG RED (NEEDLE) IMPLANT
COVER BACK TABLE 60X90IN (DRAPES) ×6 IMPLANT
COVERAGE SUPPORT O-ARM STEALTH (MISCELLANEOUS) ×3 IMPLANT
DERMABOND ADVANCED .7 DNX12 (GAUZE/BANDAGES/DRESSINGS) ×12 IMPLANT
DRAIN JACKSON RD 7FR 3/32 (WOUND CARE) IMPLANT
DRAPE C-ARM 42X72 X-RAY (DRAPES) ×9 IMPLANT
DRAPE C-ARMOR (DRAPES) ×3 IMPLANT
DRAPE INCISE IOBAN 66X45 STRL (DRAPES) IMPLANT
DRAPE LAPAROTOMY 100X72X124 (DRAPES) ×9 IMPLANT
DRAPE SHEET LG 3/4 BI-LAMINATE (DRAPES) ×12 IMPLANT
DRAPE SURG 17X23 STRL (DRAPES) ×3 IMPLANT
DRSG AQUACEL AG ADV 3.5X14 (GAUZE/BANDAGES/DRESSINGS) IMPLANT
DRSG OPSITE 4X5.5 SM (GAUZE/BANDAGES/DRESSINGS) ×3 IMPLANT
DRSG OPSITE POSTOP 4X6 (GAUZE/BANDAGES/DRESSINGS) IMPLANT
DRSG OPSITE POSTOP 4X8 (GAUZE/BANDAGES/DRESSINGS) IMPLANT
DURAPREP 26ML APPLICATOR (WOUND CARE) ×9 IMPLANT
ELECT BLADE INSULATED 4IN (ELECTROSURGICAL) ×3
ELECT COATED BLADE 2.86 ST (ELECTRODE) ×3 IMPLANT
ELECT REM PT RETURN 9FT ADLT (ELECTROSURGICAL) ×9
ELECTRODE BLADE INSULATED 4IN (ELECTROSURGICAL) ×3 IMPLANT
ELECTRODE REM PT RTRN 9FT ADLT (ELECTROSURGICAL) ×9 IMPLANT
FEE COVERAGE SUPPORT O-ARM (MISCELLANEOUS) ×3 IMPLANT
GAUZE 4X4 16PLY ~~LOC~~+RFID DBL (SPONGE) IMPLANT
GAUZE PAD ABD 8X10 STRL (GAUZE/BANDAGES/DRESSINGS) ×3 IMPLANT
GAUZE SPONGE 4X4 12PLY STRL (GAUZE/BANDAGES/DRESSINGS) ×3 IMPLANT
GEL DBM PROPEL 10ML (Putty) IMPLANT
GEL DBM PROPEL 5ML (Putty) IMPLANT
GLOVE BIO SURGEON STRL SZ8 (GLOVE) IMPLANT
GLOVE BIOGEL PI IND STRL 8 (GLOVE) ×15 IMPLANT
GLOVE BIOGEL PI IND STRL 8.5 (GLOVE) IMPLANT
GLOVE ECLIPSE 8.0 STRL XLNG CF (GLOVE) ×12 IMPLANT
GLOVE EXAM NITRILE XL STR (GLOVE) IMPLANT
GLOVE SURG ENC MOIS LTX SZ8 (GLOVE) ×9 IMPLANT
GLOVE SURG LTX SZ8 (GLOVE) ×3 IMPLANT
GLOVE SURG SS PI 7.5 STRL IVOR (GLOVE) IMPLANT
GLOVE SURG SS PI 8.0 STRL IVOR (GLOVE) IMPLANT
GLOVE SURG UNDER POLY LF SZ8.5 (GLOVE) ×6 IMPLANT
GOWN STRL REUS W/ TWL LRG LVL3 (GOWN DISPOSABLE) IMPLANT
GOWN STRL REUS W/ TWL XL LVL3 (GOWN DISPOSABLE) ×21 IMPLANT
GOWN STRL REUS W/TWL 2XL LVL3 (GOWN DISPOSABLE) ×6 IMPLANT
GOWN STRL REUS W/TWL LRG LVL3 (GOWN DISPOSABLE)
GOWN STRL REUS W/TWL XL LVL3 (GOWN DISPOSABLE) ×39
GRAFT BN 5X1XSPNE CVD POST DBM (Bone Implant) IMPLANT
GRAFT BONE MAGNIFUSE 1X5CM (Bone Implant) ×6 IMPLANT
GRAFT TRINITY ELITE LGE HUMAN (Tissue) IMPLANT
HEMOSTAT POWDER KIT SURGIFOAM (HEMOSTASIS) IMPLANT
HEMOSTAT POWDER SURGIFOAM 1G (HEMOSTASIS) ×3 IMPLANT
IV NS IRRIG 3000ML ARTHROMATIC (IV SOLUTION) ×3 IMPLANT
KIT BASIN OR (CUSTOM PROCEDURE TRAY) ×9 IMPLANT
KIT DILATOR XLIF 5 (KITS) IMPLANT
KIT INFUSE XX SMALL 0.7CC (Orthopedic Implant) IMPLANT
KIT POSITION SURG JACKSON T1 (MISCELLANEOUS) ×3 IMPLANT
KIT SHIM ACCESS MAX 4 STRL (KITS) IMPLANT
KIT SURGICAL ACCESS MAXCESS 4 (KITS) IMPLANT
KIT TURNOVER KIT B (KITS) ×9 IMPLANT
MARKER SKIN DUAL TIP RULER LAB (MISCELLANEOUS) ×3 IMPLANT
MARKER SPHERE PSV REFLC NDI (MISCELLANEOUS) ×15 IMPLANT
MODULE NVM5 NEXT GEN EMG (NEEDLE) IMPLANT
NDL HYPO 21X1.5 ECLIPSE (NEEDLE) ×6 IMPLANT
NDL HYPO 25X1 1.5 SAFETY (NEEDLE) ×3 IMPLANT
NDL SPNL 18GX3.5 QUINCKE PK (NEEDLE) ×3 IMPLANT
NEEDLE HYPO 21X1.5 ECLIPSE (NEEDLE) ×6 IMPLANT
NEEDLE HYPO 25X1 1.5 SAFETY (NEEDLE) ×3 IMPLANT
NEEDLE SPNL 18GX3.5 QUINCKE PK (NEEDLE) ×3 IMPLANT
NS IRRIG 1000ML POUR BTL (IV SOLUTION) ×9 IMPLANT
PACK LAMINECTOMY NEURO (CUSTOM PROCEDURE TRAY) IMPLANT
PAD ARMBOARD 7.5X6 YLW CONV (MISCELLANEOUS) ×15 IMPLANT
PENCIL SMOKE EVACUATOR (MISCELLANEOUS) IMPLANT
PLAN OP SURG PATIENT SPEC (ORTHOPEDIC DISPOSABLE SUPPLIES) IMPLANT
ROD SPINAL THRD DISP (MISCELLANEOUS) IMPLANT
ROD UNID EXP 6 4L (Rod) IMPLANT
SCREW 6.5X40 (Screw) IMPLANT
SCREW BS CNCMA S 8.5X80 T/C (Screw) IMPLANT
SCREW POST SOLERA 7.5X35 F/5.5 (Screw) IMPLANT
SCREW SET SOLERA (Screw) ×36 IMPLANT
SCREW SET SOLERA TI5.5 (Screw) IMPLANT
SCREW SOLERA 45X6.5XMA NS SPNE (Screw) IMPLANT
SCREW SOLERA 6.5X45MM (Screw) ×15 IMPLANT
SCREW SOLERA 7.5X45 (Screw) IMPLANT
SPACER HEDRON IA 24X30X11 8D (Spacer) IMPLANT
SPACER RISE-L 18X50 8-15 6D (Spacer) IMPLANT
SPACER RISE-L 18X55 7-14MM (Spacer) IMPLANT
SPIKE FLUID TRANSFER (MISCELLANEOUS) ×3 IMPLANT
SPONGE INTESTINAL PEANUT (DISPOSABLE) ×6 IMPLANT
SPONGE SURGIFOAM ABS GEL 100 (HEMOSTASIS) IMPLANT
SPONGE SURGIFOAM ABS GEL SZ50 (HEMOSTASIS) IMPLANT
SPONGE T-LAP 18X18 ~~LOC~~+RFID (SPONGE) ×6 IMPLANT
SPONGE T-LAP 4X18 ~~LOC~~+RFID (SPONGE) IMPLANT
STAPLER VISISTAT 35W (STAPLE) ×6 IMPLANT
SUT PDS AB 1 CTX 36 (SUTURE) IMPLANT
SUT SILK 0 TIES 10X30 (SUTURE) IMPLANT
SUT SILK 2 0 TIES 10X30 (SUTURE) ×6 IMPLANT
SUT VIC AB 0 CT1 18XCR BRD8 (SUTURE) IMPLANT
SUT VIC AB 0 CT1 8-18 (SUTURE) ×6
SUT VIC AB 1 CT1 18XBRD ANBCTR (SUTURE) IMPLANT
SUT VIC AB 1 CT1 8-18 (SUTURE)
SUT VIC AB 2-0 CP2 18 (SUTURE) ×3 IMPLANT
SUT VIC AB 3-0 SH 8-18 (SUTURE) ×3 IMPLANT
SYR 20ML LL LF (SYRINGE) ×6 IMPLANT
TAPE CLOTH 3X10 TAN LF (GAUZE/BANDAGES/DRESSINGS) ×9 IMPLANT
TIP CART INSTAFILL GL 5CC (ORTHOPEDIC DISPOSABLE SUPPLIES) IMPLANT
TIP CONICAL INSTAFILL (ORTHOPEDIC DISPOSABLE SUPPLIES) IMPLANT
TOWEL GREEN STERILE (TOWEL DISPOSABLE) ×6 IMPLANT
TOWEL GREEN STERILE FF (TOWEL DISPOSABLE) ×6 IMPLANT
TRAY FOLEY MTR SLVR 16FR STAT (SET/KITS/TRAYS/PACK) ×3 IMPLANT
WATER STERILE IRR 1000ML POUR (IV SOLUTION) ×9 IMPLANT

## 2022-11-09 NOTE — Anesthesia Procedure Notes (Signed)
Arterial Line Insertion Start/End12/05/2022 7:55 AM, 11/09/2022 8:05 AM Performed by: Lonia Mad, CRNA, CRNA  Patient location: Pre-op. Preanesthetic checklist: patient identified, IV checked, site marked, risks and benefits discussed, surgical consent, monitors and equipment checked, pre-op evaluation, timeout performed and anesthesia consent Lidocaine 1% used for infiltration Right, radial was placed Catheter size: 20 G Hand hygiene performed  and maximum sterile barriers used  Allen's test indicative of satisfactory collateral circulation Attempts: 3 Procedure performed without using ultrasound guided technique. Following insertion, dressing applied and Biopatch. Post procedure assessment: normal  Post procedure complications: second provider assisted and unsuccessful attempts. Patient tolerated the procedure well with no immediate complications.

## 2022-11-09 NOTE — Progress Notes (Signed)
Attempted to call report to 4NP, unable to take report at this time

## 2022-11-09 NOTE — Transfer of Care (Signed)
Immediate Anesthesia Transfer of Care Note  Patient: Kathryn Lynn  Procedure(s) Performed: LUMBAR FIVE-SACRAL ONE ANTERIOR LUMBAR INTERBODY FUSION (Spine Lumbar) LUMBAR TWO-THREE, LUMBAR THREE-FOUR, LUMBAR FOUR-FIVE ANTERIOR LATERAL LUMBAR INTERBODY FUSION (Right: Spine Lumbar) LUMBAR TWO-PELVIS INSTRUMENTATION (Spine Lumbar) Application of O-Arm (Spine Lumbar) APPLICATION OF CELL SAVER ABDOMINAL EXPOSURE  Patient Location: PACU  Anesthesia Type:General  Level of Consciousness: drowsy  Airway & Oxygen Therapy: Patient Spontanous Breathing and Patient connected to face mask oxygen  Post-op Assessment: Report given to RN and Post -op Vital signs reviewed and stable  Post vital signs: Reviewed and stable  Last Vitals:  Vitals Value Taken Time  BP 146/93 11/09/22 2030  Temp 36.3 C 11/09/22 2023  Pulse 87 11/09/22 2040  Resp 11 11/09/22 2040  SpO2 100 % 11/09/22 2040  Vitals shown include unvalidated device data.  Last Pain:  Vitals:   11/09/22 0707  TempSrc:   PainSc: 2       Patients Stated Pain Goal: 1 (11/09/22 0707)  Complications: No notable events documented.
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
## 2022-11-09 NOTE — Op Note (Signed)
Providing Compassionate, Quality Care - Together  Date of service: 11/09/2022  PREOP DIAGNOSIS:  Degenerative lumbar spondylosis, stenosis with sagittal and coronal imbalance L2-S1 Left lower extremity radiculopathy  POSTOP DIAGNOSIS: Same  PROCEDURE:  Stage I: Anterior lumbar discectomy and interbody fusion, L5-S1:  Placement of anterior biomechanical device, L5-S1: Globus titanium 11 mm 8 degree interbody device, 25 mm anchors (2 superior, 1 inferior) Intraoperative use of allograft, DBM Intraoperative use of fluoroscopy  Stage II: Anterior-lateral lumbar interbody fusion, right sided approach, L2-3, L3-4, L4-5 Placement of anterior biomechanical device, L2-3: Globus rise expandable titanium interbody 7 to 14 mm expanded to 9.5 mm, L3-4: 8 m, 6 degree, expanded to 12 mm, L4-5 815 mm 6 degree lordosis, expanded to 10.5 mm Intraoperative use of allograft, DBM Intraoperative use of fluoroscopy  Stage III: Open lumbar instrumentation and arthrodesis with posterior lateral fusion, L2-3, L3-4, L4-5, L5-S1, S2 AI pelvis instrumentation Stereotactic placement of bilateral segmental pedicle screws and pelvic instrumentation, O-arm Stealth: Medtronic Solera L2: 6.5 x 45 mm bilaterally, L3: 6.5 x 45 mm bilaterally, L4: 7.5 x 45 mm on the left, 6.5 x 45 mm on the right, L5 6.5 x 40 mm bilaterally, S1 7.5 x 35 mm bilaterally, S2 AI 8.5 x 80 mm bilaterally Left lateral recess decompression, L4-5, L5-S1 Intraoperative use of autograft, same incision Intraoperative use of allograft  SURGEON: Dr. Kendell Bane C. John Vasconcelos, DO  CO-SURGEON: Dr. Clotilde Dieter, MD (stage I only)  ASSISTANT: Dr. Marikay Alar, MD  ANESTHESIA: General Endotracheal  EBL: Stage I: 100 cc, stage II: 100 cc, stage III: 300 cc, total 500 cc  SPECIMENS: None  DRAINS: Medium Hemovac  COMPLICATIONS: None  CONDITION: Hemodynamically stable  HISTORY: Kathryn Lynn is a 71 y.o. female with a history of worsening chronic low  back pain and left lower extremity radiculopathy.  Imaging revealed degenerative scoliosis with coronal and sagittal imbalance.  There is significant disc collapse, spondylosis and lateral listhesis at L2-3, L3-4, L4-5 with rotational changes.  There is severe stenosis at L4-5 and L5-S1 on the left lateral recess and foraminal regions.  Overall there is loss of lordosis as well.  She failed conservative measures and therefore presented for surgical intervention.  She obtain medical clearance.  We discussed surgical intervention in the form of an ALIF L5-S1, L LIF L2-5, posterior instrumentation and fusion L2-pelvis with decompression L4-5, L5-S1.  All risks, benefits and expected outcomes were discussed and agreed upon as well as alternatives to treatment.  Informed consent was obtained and witnessed.  PROCEDURE IN DETAIL: The patient was brought to the operating room. After induction of general anesthesia, the patient was positioned on the operative table in the supine position. All pressure points were meticulously padded. Skin incision was then marked out and prepped and draped in the usual sterile fashion. Physician driven timeout was performed.  Please see Dr. Ophelia Charter operative report and his access to the lumbar spine through the abdomen.  I assisted him for the entirety of this procedure.  Once the L5-S1 to space was localized with lateral fluoroscopy, I marked midline with AP fluoroscopy.  I performed an annulotomy with a 15 blade.  A radical discectomy was performed with a series of Kerrison rongeurs and curettes.  I then used a 6 mm trial to expand the disc base and this was an expandable trial that I expanded up to approximately 9 mm.  Lateral fluoroscopy confirmed appropriate expansion.  This was removed and the cartilaginous endplates were cleared of cartilage  down to subchondral bleeding bone superiorly and inferiorly.  We then selected the above interbody device, filled it with allograft and  placed this under lateral fluoroscopy.  There was appropriate placement, 3 anchors were placed, 2 superior, 1 inferior of 25 mm with appropriate bony purchase.  AP and lateral fluoroscopy confirmed appropriate placement.  Remainder of allograft was placed lateral to the interbody device in the disc space.  The wound was noted to be excellently hemostatic.  Retractors were gently taken down and the vascular and abdominal structures returned to their normal position and there was no significant bleeding.  Abdominal x-ray was obtained and there was no retained materials.  I then proceeded to close the wound, 0 PDS for the anterior rectus fascia.  I then closed the dermis with 2-0 and 3-0 Vicryl sutures.  The skin was closed with skin glue.  Sterile dressing was applied.  Stage II: The patient was remain intubated, and positioned in the lateral decubitus position with the right side up.  Using AP and lateral fluoroscopy the patient was positioned orthogonally.  She was secured to the bed with tape and the bed was gently broken and extension in order to increase the space between the iliac crest and ribs on the right side.  All bony prominences were appropriately padded.  The L2-3, L3-4 and L4-5 disc spaces were lined up orthogonally with lateral x-ray and marked.  Physician driven timeout was performed.  Then sterilely prepped and draped in normal fashion.    L2-3 LLIF: Using a 10 blade, sharp incision was made obliquely over the L2-3 and L3-4 disc bases.Bovie electrocautery was used carried down to the fascia.  The fascia was incised with the Bovie electrocautery.  Using blunt dissection with snaps, I dissected through the 3 muscular layers to the retroperitoneal fat.  Then using digital dissection, the retroperitoneal space was dissected and I could feel the inside of the iliac crest, lower ribs and psoas musculature. I then proceeded to dilate into the psoas muscle using lateral fluoroscopy at approximately the  50 yard line at L2-3 while neuro monitoring.  The plexus was identified posteriorly and stimulated at greater than 20 mA.  K wire was placed in the L2-3 disc base.  I then proceeded to continue dilated and again neuro monitoring was noted to be appropriate.  Retractor was placed and confirmed appropriate placement with AP and lateral fluoroscopy.  Retractor was opened, the disc base was cleared from remaining psoas with Penfield 4.  I stimulated the retraction area and there was no significant stimulation noted.  Using bipolar cautery, the annulus was cauterized.  Using annulotomy knife to perform a annulotomy and then a radical discectomy with a series of Cobb, pituitary and Kerrison rongeurs and box cutter.  Under AP fluoroscopy we used a Cobb to break the contralateral annulus.  We then used a box cutter and trials to expand the disc base.  The endplates were then cleared of collagenous endplate to subchondral bleeding bone.  The above trial was then selected and filled with allograft and placed.  It was then expanded to the above listed height.  AP and lateral fluoroscopy confirmed appropriate placement.  Hemostasis was achieved with Surgifoam and cottonoid.  The retractor was gently let down and there was no significant bleeding and the psoas appeared to retract back to its original position.  Retraction time was not significant.  L3-4 LLIF: I then proceeded to dilate into the psoas muscle using lateral fluoroscopy at approximately the  50 yard line at L3-4 while neuro monitoring.  The plexus was identified posteriorly and stimulated at greater than 20 mA.  K wire was placed in the L3-4 disc base.  I then proceeded to continue dilated and again neuro monitoring was noted to be appropriate.  Retractor was placed and confirmed appropriate placement with AP and lateral fluoroscopy.  Retractor was opened, the disc base was cleared from remaining psoas with Penfield 4.  I stimulated the retraction area and there  was no significant stimulation noted.  Using bipolar cautery, the annulus was cauterized.  Using annulotomy knife to perform a annulotomy and then a radical discectomy with a series of Cobb, pituitary and Kerrison rongeurs and box cutter.  Under AP fluoroscopy we used a Cobb to break the contralateral annulus.  We then used a box cutter and trials to expand the disc base.  The endplates were then cleared of collagenous endplate to subchondral bleeding bone.  The above trial was then selected and filled with allograft and placed.  It was then expanded to the above listed height.  AP and lateral fluoroscopy confirmed appropriate placement.  Hemostasis was achieved with Surgifoam and cottonoid.  The retractor was gently let down and there was no significant bleeding and the psoas appeared to retract back to its original position.  Retraction time was not significant.  L4-5 LLIF: Using a 10 blade, sharp incision was made over the L4-5 disc space.  Bovie electrocautery was used carried down to the fascia.  The fascia was incised with the Bovie electrocautery.  Using blunt dissection with snaps, I dissected through the 3 muscular layers to the retroperitoneal fat.  Then using digital dissection, the retroperitoneal space was dissected and I could feel the inside of the iliac crest, lower ribs and psoas musculature. I then proceeded to dilate into the psoas muscle using lateral fluoroscopy at approximately the 50 yard line at L4-5 while neuro monitoring.  The plexus was identified posteriorly and stimulated at greater than 16 mA.  K wire was placed in the L4-5 disc base.  I then proceeded to continue dilated and again neuro monitoring was noted to be appropriate.  Retractor was placed and confirmed appropriate placement with AP and lateral fluoroscopy.  Retractor was opened, the disc base was cleared from remaining psoas with Penfield 4.  I stimulated the retraction area and there was no significant stimulation noted.   Using bipolar cautery, the annulus was cauterized.  Using annulotomy knife to perform a annulotomy and then a radical discectomy with a series of Cobb, pituitary and Kerrison rongeurs and box cutter.  Under AP fluoroscopy we used a Cobb to break the contralateral annulus.  We then used a box cutter and trials to expand the disc base.  The endplates were then cleared of collagenous endplate to subchondral bleeding bone.  The above trial was then selected and filled with allograft and placed.  It was then expanded to the above listed height.  AP and lateral fluoroscopy confirmed appropriate placement.  Hemostasis was achieved with Surgifoam and cottonoid.  The retractor was gently let down and there was no significant bleeding and the psoas appeared to retract back to its original position.  Retraction time was not significant.  The wounds were hemostased with monopolar cautery.  I then closed the lateral fascia with 0 Vicryl suture.  Dermis was closed with 2-0 and 3-0 Vicryl suture.  Skin was closed with skin glue.  Sterile dressing was applied.  Stage III: The patient  was carefully placed prone and all bony prominences were appropriately padded.  Physician driven timeout was performed.  The L2-sacrum was marked and sterilely prepped and draped in a normal fashion.  Using 10 blade, incision was made sharply in midline over the L2-sacrum.  Bovie electrocautery was performed down to the dorsal fascia.  Subperiosteal dissection was performed with Bovie electrocautery bilaterally to expose the L2 lamina spinous processes, and transverse processes.  This was repeated at L3, L4, L5, S1 and the superior portion of the sacrum down to the S2 foramina bilaterally.  We then placed the stereotactic frame on the L4 spinous process.  Intraoperative CT was obtained and verified with excellent accuracy.  We then used the high-speed drill to create a pilot hole bilaterally in the L2 pedicles.  Using a 5.5 mm tap, I tapped to a  depth of 40 mm using navigation.  Using ball-tipped probe there was bony prominences in all directions and a bony floor.  The above listed pedicle screws were placed bilaterally at L2 with appropriate bony purchase.  This was repeated bilaterally at L3, L4, L5, S1.  I then proceeded to drill a pilot hole between the S1 and S2 foramina for S2 AI screws bilaterally.  Using a 6.5 mm tap under navigation trajectory into the ilium, tap was performed down to 80 mm.  Ball-tipped probe confirmed appropriate bony borders in all directions and a bony floor.  The above listed screws were placed bilaterally.  I then performed a hemilaminectomy on the left at L4-5 and L5-S1 down to the ligamentum flavum using high-speed drill.  The ligamentous attachment was identified and carefully dissected from the epidural space with Penfield 4.  Using Kerrison rongeurs the ligamentum flavum was resected to the lateral thecal sac and traversing nerve root.  They were now decompressed.  Epidural hemostasis was achieved with Surgifoam.  Using ball-tipped probe, the nerve roots were followed along their entire course and noted to be appropriately decompressed.  I then decorticated the L2-3, L3-4, L4-5, L5-S1 joints bilaterally and the transverse processes bilaterally L2-L5.  Autograft was kept and replaced bilaterally.  Allograft was packed in the bilateral lateral gutters from L2-S1.  Appropriate size rods were then contoured, cut and placed.  Setscrews were placed and final tightened to the manufacturer's recommendation.  Deep retractors were taken out of the wound.  Soft tissue hemostasis was achieved with bipolar cautery.  A medium Hemovac was tunneled laterally and placed in the wound.  Muscle and fascia was then closed with 0 Vicryl sutures, dermis was closed with 2-0 Vicryl sutures.  Skin was closed with staples.  Sterile dressing was applied.  At the end of the case and each stage all sponge, needle, and instrument counts were  correct. The patient was then transferred to the stretcher, extubated, and taken to the post-anesthesia care unit in stable hemodynamic condition.

## 2022-11-09 NOTE — Anesthesia Postprocedure Evaluation (Signed)
Anesthesia Post Note  Patient: Kathryn Lynn  Procedure(s) Performed: LUMBAR FIVE-SACRAL ONE ANTERIOR LUMBAR INTERBODY FUSION (Spine Lumbar) LUMBAR TWO-THREE, LUMBAR THREE-FOUR, LUMBAR FOUR-FIVE ANTERIOR LATERAL LUMBAR INTERBODY FUSION (Right: Spine Lumbar) LUMBAR TWO-PELVIS INSTRUMENTATION (Spine Lumbar) Application of O-Arm (Spine Lumbar) APPLICATION OF CELL SAVER ABDOMINAL EXPOSURE     Patient location during evaluation: PACU Anesthesia Type: General Level of consciousness: awake and alert Pain management: pain level controlled Vital Signs Assessment: post-procedure vital signs reviewed and stable Respiratory status: spontaneous breathing, nonlabored ventilation, respiratory function stable and patient connected to nasal cannula oxygen Cardiovascular status: blood pressure returned to baseline and stable Postop Assessment: no apparent nausea or vomiting Anesthetic complications: no  No notable events documented.  Last Vitals:  Vitals:   11/09/22 2130 11/09/22 2219  BP: 124/72 (!) 146/82  Pulse: 86 99  Resp: 12   Temp: (!) 36.2 C 36.4 C  SpO2: 100% 98%    Last Pain:  Vitals:   11/09/22 2219  TempSrc: Oral  PainSc:                  [name]

## 2022-11-09 NOTE — Op Note (Signed)
Date: November 09, 2022  Preoperative diagnosis: Degenerative scoliosis with spinal stenosis  Postoperative diagnosis: Same  Procedure: Anterior spine exposure at the L5-S1 disc space via anterior retroperitoneal approach for L5-S1 ALIF  Surgeon: Dr. Cephus Shelling, MD  Co-surgeon: Dr. Monia Pouch, DO  Indications: 71 year old female with chronic lower back pain with degenerative scoliosis.  She has been evaluated by Dr. Jake Samples with neurosurgery.  He has asked vascular surgery to assist with anterior spine exposure at L5-S1 for L5-S1 ALIF.  She presents today after risks benefits discussed.  Findings: The L5-S1 disc space was marked over the left rectus muscle with a fluoroscopic C arm in the lateral position.  Once abdominal wall was prepped and draped, an incision was made here in transverse fashion.  The anterior rectus sheath was opened and the muscle was moved to the midline and entered into the retroperitoneum and mobilized peritoneum and left ureter across midline including ligating the middle sacral vessels.  Mobilized the left iliac vein lateral to the disc space to get the anterior disc space exposed.  Fixed NuVasive retractor was placed.  Confirmed we were at the correct level with a needle in the disc space on lateral fluoroscopy.    Anesthesia: General  Details: Patient was taken to the operating room after informed consent was obtained.  Placed on the operative table in supine position.  General endotracheal anesthesia was induced.  Fluoroscopic C-arm was then used in lateral position and we marked the L5-S1 disc space over the left rectus muscle.  The abdominal wall was then prepped and draped in standard sterile fashion.  Antibiotics were given and timeout performed.  Ultimately a transverse incision was made over the L5-S1 disc space at our preoperative mark.  I dissected through the subcutaneous tissue with Bovie cautery and used cerebellar retractors.  The anterior rectus  sheath was encountered and this was opened transversely as well over the disc space.  I then raised flaps underneath the anterior rectus sheath with hemostat.  I then mobilized the left rectus muscle to the midline.  I entered into the retroperitoneum and mobilized peritoneum and left ureter to the midline with Kd and blunt dissection.  At that time Dr. Jake Samples used hand-held Wiley retractors to mobilize the peritoneum and left ureter to the midline.  I then continued to mobilize peritoneum across the disc space with suction and Kd.  The middle sacral vessels were identified and ligated between clips and divided.  I then mobilized the left iliac vein lateral to the disc space and got good working room here.  Fixed NuVasive retractor was placed with 160 reverse lip on each side of the L5-S1 disc space.  I put 120 reverse lip cranial and a 140 reverse lip caudal.  We had good exposure here.  Spinal needle was placed in the disc space.  We confirmed on lateral fluoroscopy we were at the correct L5-S1 level.  Case was turned over to Dr. Jake Samples.  Complication: None  Condition: Stable  Cephus Shelling, MD [facility] Office: [phone]   Cephus Shelling
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
## 2022-11-09 NOTE — Progress Notes (Signed)
Pharmacy Antibiotic Note  Kathryn Lynn is a 71 y.o. female admitted on 11/09/2022 with surgical prophylaxis.  Pharmacy has been consulted for vancomycin dosing.  Patient s/p spine lumbar fusion surgery currently with closed system drain in back. Orders for vancomycin for prophylaxis until drain removed. Given her renal dysfunction patient qualifies for vancomycin daily, dose given preop this am.   Plan: Will check bmet in am Vancomycin 1g q24 ordered to start in am  Height: 5\' 2"  (157.5 cm) Weight: 78.5 kg (173 lb) IBW/kg (Calculated) : 50.1  Temp (24hrs), Avg:97.6 F (36.4 C), Min:97.1 F (36.2 C), Max:98.3 F (36.8 C)  No results for input(s): "WBC", "CREATININE", "LATICACIDVEN", "VANCOTROUGH", "VANCOPEAK", "VANCORANDOM", "GENTTROUGH", "GENTPEAK", "GENTRANDOM", "TOBRATROUGH", "TOBRAPEAK", "TOBRARND", "AMIKACINPEAK", "AMIKACINTROU", "AMIKACIN" in the last 168 hours.  Estimated Creatinine Clearance: 30.9 mL/min (A) (by C-G formula based on SCr of 1.62 mg/dL (H)).    Allergies  Allergen Reactions   Adhesive [Tape] Rash   Cephalosporins Rash   Clindamycin Palpitations   Latex Rash   Penicillins Rash   Thank you for allowing pharmacy to be a part of this patient's care.  PharmD., BCPS Clinical Pharmacist 11/09/2022 11:09 PM

## 2022-11-10 ENCOUNTER — Encounter (HOSPITAL_COMMUNITY): Payer: Self-pay | Admitting: Neurological Surgery

## 2022-11-10 LAB — CBC
HCT: 31.1 % — ABNORMAL LOW (ref 36.0–46.0)
Hemoglobin: 10.1 g/dL — ABNORMAL LOW (ref 12.0–15.0)
MCH: 26.1 pg (ref 26.0–34.0)
MCHC: 32.5 g/dL (ref 30.0–36.0)
MCV: 80.4 fL (ref 80.0–100.0)
Platelets: 181 10*3/uL (ref 150–400)
RBC: 3.87 MIL/uL (ref 3.87–5.11)
RDW: 14.3 % (ref 11.5–15.5)
WBC: 10.6 10*3/uL — ABNORMAL HIGH (ref 4.0–10.5)
nRBC: 0 % (ref 0.0–0.2)

## 2022-11-10 LAB — BASIC METABOLIC PANEL
Anion gap: 7 (ref 5–15)
BUN: 19 mg/dL (ref 8–23)
CO2: 23 mmol/L (ref 22–32)
Calcium: 7.6 mg/dL — ABNORMAL LOW (ref 8.9–10.3)
Chloride: 106 mmol/L (ref 98–111)
Creatinine, Ser: 1.36 mg/dL — ABNORMAL HIGH (ref 0.44–1.00)
GFR, Estimated: 42 mL/min — ABNORMAL LOW (ref >60)
Glucose, Bld: 140 mg/dL — ABNORMAL HIGH (ref 70–99)
Potassium: 4.8 mmol/L (ref 3.5–5.1)
Sodium: 136 mmol/L (ref 135–145)

## 2022-11-10 MED ORDER — HEPARIN SODIUM (PORCINE) 5000 UNIT/ML IJ SOLN
5000.0000 [IU] | Freq: Three times a day (TID) | INTRAMUSCULAR | Status: DC
  Administered 2022-11-11 – 2022-11-18 (×23): 5000 [IU] via SUBCUTANEOUS
  Filled 2022-11-10 (×23): qty 1

## 2022-11-10 NOTE — Progress Notes (Signed)
Pt reported urgency to void but pt unable to void. Pt was bladder scanned for 540 ml, per protocol and order, pt was I&O cath for 700 ml, clear, yellow urine. Peri care completed prior to and after the catheterization. Will continue to closely monitor pt. Dionne Bucy RN

## 2022-11-10 NOTE — Progress Notes (Signed)
Writer called last night RN to clarify pt's output and per nurse Daja, pt had 500 ml output from time she arrived to the unit to foley removal this am at 6 am. (500 ml output during nightshift). Dionne Bucy RN

## 2022-11-10 NOTE — Progress Notes (Signed)
Pt had the urge to void but still couldn't. Pt was bladder scanned for 300 ml and I &O cath for 300 ml clear, yellow urine, per protocol with second RN. Peri care completed prior to and after. Reported off to oncoming RN. Pt in bed with call light within reach. Dionne Bucy RN

## 2022-11-10 NOTE — Evaluation (Signed)
Occupational Therapy Evaluation Patient Details Name: Kathryn Lynn MRN: [id number] DOB: [birth year] Today's Date: 11/10/2022   History of Present Illness 71 y.o. female [facility] hospital on 11/09/2022 with worsening back pain and LLE radiculopathy. Pt underwent L5-S1 ALIF, L2-5 XLIF and L2-pelvis fusion on 12/6. PMH includes OA, CKD, GERD, abdominal hysterectomy.   Clinical Impression   Patient admitted for the diagnosis and procedure above.  PTA she lives alone, and despite increasing back pain, was relatively independent.  Deficits impacting independence are listed below.  Currently she is needing up to Max A for basic mobility, and lower body ADL completion at bedlevel.  OT will follow, but given the level of assist needed, and that she lives alone, SNF level is recommended for post acute rehab.        Recommendations for follow up therapy are one component of a multi-disciplinary discharge planning process, led by the attending physician.  Recommendations may be updated based on patient status, additional functional criteria and insurance authorization.   Follow Up Recommendations  Skilled nursing-short term rehab (<3 hours/day)     Assistance Recommended at Discharge Frequent or constant Supervision/Assistance  Patient can return home with the following Help with stairs or ramp for entrance;Assist for transportation;Assistance with cooking/housework;Two people to help with walking and/or transfers;A lot of help with bathing/dressing/bathroom    Functional Status Assessment  Patient has had a recent decline in their functional status and demonstrates the ability to make significant improvements in function in a reasonable and predictable amount of time.  Equipment Recommendations  BSC/3in1    Recommendations for Other Services       Precautions / Restrictions Precautions Precautions: Fall;Back Restrictions Weight Bearing Restrictions: No      Mobility Bed Mobility    Bed Mobility: Rolling Rolling: Mod assist              Transfers                          Balance                                           ADL either performed or assessed with clinical judgement   ADL Overall ADL's : Needs assistance/impaired Eating/Feeding: Set up;Bed level   Grooming: Minimal assistance;Bed level   Upper Body Bathing: Moderate assistance;Bed level   Lower Body Bathing: Bed level;Maximal assistance   Upper Body Dressing : Moderate assistance;Bed level   Lower Body Dressing: Maximal assistance;Bed level                       Vision Baseline Vision/History: 1 Wears glasses Patient Visual Report: No change from baseline                  Pertinent Vitals/Pain Pain Assessment Pain Assessment: Faces Faces Pain Scale: Hurts even more Pain Location: RLE, back Pain Descriptors / Indicators: Grimacing, Guarding, Tender Pain Intervention(s): Monitored during session     Hand Dominance Right   Extremity/Trunk Assessment Upper Extremity Assessment Upper Extremity Assessment: RUE deficits/detail;LUE deficits/detail RUE Deficits / Details: weak grip, difficulty holding onto utensils. RUE Sensation: decreased light touch;WNL RUE Coordination: decreased fine motor LUE Deficits / Details: weak grip, difficulty holding onto utensils. LUE Sensation: decreased light touch LUE Coordination: decreased fine motor   Lower Extremity Assessment Lower Extremity Assessment:  Defer to PT evaluation   Cervical / Trunk Assessment Cervical / Trunk Assessment: Back Surgery   Communication Communication Communication: No difficulties   Cognition Arousal/Alertness: Awake/alert Behavior During Therapy: WFL for tasks assessed/performed Overall Cognitive Status: Within Functional Limits for tasks assessed                                                        Home Living Family/patient expects to  be discharged to:: Private residence Living Arrangements: Alone Available Help at Discharge: Family;Friend(s);Available PRN/intermittently Type of Home: House Home Access: Stairs to enter [employer] of Steps: 2 Entrance Stairs-Rails: Can reach both Home Layout: One level     Bathroom Shower/Tub: Producer, television/film/video: Handicapped height Bathroom Accessibility: Yes How Accessible: Accessible via walker Home Equipment: None          Prior Functioning/Environment Prior Level of Function : Independent/Modified Independent;Driving                        OT Problem List: Decreased strength;Decreased range of motion;Impaired balance (sitting and/or standing);Decreased knowledge of use of DME or AE;Decreased safety awareness;Impaired UE functional use;Pain      OT Treatment/Interventions: Self-care/ADL training;Therapeutic exercise;Therapeutic activities;Patient/family education;Balance training;DME and/or AE instruction    OT Goals(Current goals can be found in the care plan section) Acute Rehab OT Goals Patient Stated Goal: None stated OT Goal Formulation: With patient Time For Goal Achievement: 11/24/22 Potential to Achieve Goals: Good  OT Frequency: Min 2X/week    Co-evaluation              AM-PAC OT "6 Clicks" Daily Activity     Outcome Measure Help from another person eating meals?: A Little Help from another person taking care of personal grooming?: A Little Help from another person toileting, which includes using toliet, bedpan, or urinal?: A Lot Help from another person bathing (including washing, rinsing, drying)?: A Lot Help from another person to put on and taking off regular upper body clothing?: A Lot Help from another person to put on and taking off regular lower body clothing?: A Lot 6 Click Score: 14   End of Session Nurse Communication: Mobility status  Activity Tolerance: Treatment limited secondary to medical  complications (Comment) Patient left: in bed;with call bell/phone within reach  OT Visit Diagnosis: Pain Pain - Right/Left: Right Pain - part of body: Leg                Time: 2979-8921 OT Time Calculation (min): 18 min Charges:  OT General Charges $OT Visit: 1 Visit OT Evaluation $OT Eval Moderate Complexity: 1 Mod  11/10/2022  RP, OTR/L  Acute Rehabilitation Services  Office:  [phone]   Suzanna Obey 11/10/2022, 3:25 PM

## 2022-11-10 NOTE — Progress Notes (Addendum)
  Progress Note    11/10/2022 7:27 AM 1 Day Post-Op  Subjective:  denies any pain, feels very fatigued    Vitals:   11/10/22 0256 11/10/22 0716  BP: 126/67 113/61  Pulse: 98 (!) 117  Resp: 14 17  Temp: 98.1 F (36.7 C) 98.7 F (37.1 C)  SpO2: 98% 95%    Physical Exam: General:  resting comfortably, NAD  Cardiac:  RRR  Lungs:  nonlabored Incisions:  abdominal incision intact and dry with honeycomb dressing Extremities:  bilateral lower extremities warm and well perfused. Intact motor and sensation Abdomen:  soft, NT, ND  CBC    Component Value Date/Time   WBC 10.6 (H) 11/10/2022 0440   RBC 3.87 11/10/2022 0440   HGB 10.1 (L) 11/10/2022 0440   HCT 31.1 (L) 11/10/2022 0440   PLT 181 11/10/2022 0440   MCV 80.4 11/10/2022 0440   MCH 26.1 11/10/2022 0440   MCHC 32.5 11/10/2022 0440   RDW 14.3 11/10/2022 0440   LYMPHSABS 1.9 10/04/2022 1019   MONOABS 0.4 10/04/2022 1019   EOSABS 0.2 10/04/2022 1019   BASOSABS 0.0 10/04/2022 1019    BMET    Component Value Date/Time   NA 136 11/10/2022 0440   NA 141 10/17/2018 0000   K 4.8 11/10/2022 0440   CL 106 11/10/2022 0440   CO2 23 11/10/2022 0440   GLUCOSE 140 (H) 11/10/2022 0440   BUN 19 11/10/2022 0440   BUN 20 10/17/2018 0000   CREATININE 1.36 (H) 11/10/2022 0440   CREATININE 1.55 (H) 04/21/2020 1337   CALCIUM 7.6 (L) 11/10/2022 0440   GFRNONAA 42 (L) 11/10/2022 0440   GFRAA 56 (L) 03/30/2012 0001    INR No results found for: "INR"   Intake/Output Summary (Last 24 hours) at 11/10/2022 0727 Last data filed at 11/10/2022 0551 Gross per 24 hour  Intake 4685 ml  Output 1775 ml  Net 2910 ml      Assessment/Plan:  71 y.o. female is 1 day post op, s/p: L5-S1 ALIF with anterior spine exposure   -Patient's pain is well controlled -Intact motor and sensation of BLE -Abdomen is soft and nontender. Incision is clean and dry with honeycomb dressing  Loel Dubonnet, PA-C [facility] [phone] 11/10/2022 7:27 AM  I have seen and evaluated the patient. I agree with the PA note as documented above.  Postop day 1 status post anterior spine exposure for L5-S1 ALIF.  Abdomen with appropriate postop incisional tenderness.  Abdominal incision looks good. Palpable PT pulse in the left foot.  Looks good from a vascular standpoint.  Cephus Shelling, MD [facility] [place] Office: [phone]

## 2022-11-10 NOTE — Progress Notes (Signed)
   Providing Compassionate, Quality Care - Together  NEUROSURGERY PROGRESS NOTE   S: No issues overnight.   O: EXAM:  BP 113/61 (BP Location: Left Arm)   Pulse (!) 117   Temp 98.7 F (37.1 C) (Oral)   Resp 17   Ht 5\' 2"  (1.575 m)   Wt 78.5 kg   SpO2 95%   BMI 31.64 kg/m   Awake, alert, oriented x3 PERRL Speech fluent, appropriate  CNs grossly intact  Abd soft, NT 5/5 BUE/BLE except R HF 3/5 Incisions c/d/I Hmv in place  ASSESSMENT:  71 y.o. female with   Degen scoliosis and stenosis L2-S1  S/p L5-S1 ALIF, L2-5 XLIF, L2-Pelvis instrumentation/fusion 12/6  PLAN: - mobilize -pt/ot Nurse needs to record UO, overnight nurse did not -am labs stable     Thank you for allowing me to participate in this patient's care.  Please do not hesitate to call with questions or concerns.   14/6, DO Neurosurgeon [facility] Cell: [phone]

## 2022-11-10 NOTE — Evaluation (Signed)
Physical Therapy Evaluation Patient Details Name: Kathryn Lynn MRN: [id number] DOB: [birth year] Today's Date: 11/10/2022  History of Present Illness  71 y.o. female presents to [facility] hospital on 11/09/2022 with worsening back pain and LLE radiculopathy. Pt underwent L5-S1 ALIF, L2-5 XLIF and L2-pelvis fusion on 12/6. PMH includes OA, CKD, GERD, abdominal hysterectomy.  Clinical Impression  Pt presents to PT with deficits in activity tolerance, balance, strength, power, endurance. Pt is limited by symptomatic hypotension with sitting at the edge of bed. Pt currently requires significant assistance to perform bed mobility and is unable to progress to transfers at this time. PT recommends continued aggressive mobilization as tolerated. Pt will benefit from Snf placement due to impaired mobility, high falls risk and limited caregiver support.       Recommendations for follow up therapy are one component of a multi-disciplinary discharge planning process, led by the attending physician.  Recommendations may be updated based on patient status, additional functional criteria and insurance authorization.  Follow Up Recommendations Skilled nursing-short term rehab (<3 hours/day) Can patient physically be transported by private vehicle: No    Assistance Recommended at Discharge Intermittent Supervision/Assistance  Patient can return home with the following  A lot of help with walking and/or transfers;A lot of help with bathing/dressing/bathroom;Assistance with cooking/housework;Direct supervision/assist for medications management;Direct supervision/assist for financial management;Assist for transportation;Help with stairs or ramp for entrance    Equipment Recommendations Rolling walker (2 wheels);BSC/3in1  Recommendations for Other Services       Functional Status Assessment Patient has had a recent decline in their functional status and demonstrates the ability to make significant improvements in  function in a reasonable and predictable amount of time.     Precautions / Restrictions Precautions Precautions: Fall;Back Precaution Booklet Issued: Yes (comment) Required Braces or Orthoses:  (no brace needed) Restrictions Weight Bearing Restrictions: No      Mobility  Bed Mobility Overal bed mobility: Needs Assistance Bed Mobility: Rolling, Sidelying to Sit, Sit to Supine Rolling: Mod assist Sidelying to sit: Mod assist, HOB elevated   Sit to supine: Total assist        Transfers Overall transfer level:  (deferred 2/2 symptomatic orthostatic BP)                      Ambulation/Gait                  Stairs            Wheelchair Mobility    Modified Rankin (Stroke Patients Only)       Balance Overall balance assessment: Needs assistance Sitting-balance support: No upper extremity supported, Feet supported Sitting balance-Leahy Scale: Fair Sitting balance - Comments: minG 2/2 symptoms of orthostatic BP                                     Pertinent Vitals/Pain Pain Assessment Pain Assessment: 0-10 Pain Score: 7  Pain Location: RLE Pain Descriptors / Indicators: Aching Pain Intervention(s): Monitored during session    Home Living Family/patient expects to be discharged to:: Private residence Living Arrangements: Alone Available Help at Discharge: Family;Friend(s);Available PRN/intermittently Type of Home: House Home Access: Stairs to enter Entrance Stairs-Rails: Can reach both Entrance Stairs-Number of Steps: 2   Home Layout: One level Home Equipment: None      Prior Function Prior Level of Function : Independent/Modified Independent;Driving  Hand Dominance        Extremity/Trunk Assessment   Upper Extremity Assessment Upper Extremity Assessment: RUE deficits/detail RUE Deficits / Details: grossly 4/5 RUE Sensation: decreased light touch    Lower Extremity Assessment Lower  Extremity Assessment: Generalized weakness    Cervical / Trunk Assessment Cervical / Trunk Assessment: Back Surgery  Communication   Communication: No difficulties  Cognition Arousal/Alertness: Awake/alert Behavior During Therapy: WFL for tasks assessed/performed Overall Cognitive Status: Impaired/Different from baseline Area of Impairment: Orientation, Problem solving                 Orientation Level: Disoriented to, Time (reports december 27th)           Problem Solving: Slow processing          General Comments General comments (skin integrity, edema, etc.): pt hypotensive when sitting, 84/48 (56), symptomatic. BP 100/55 after return to supine    Exercises     Assessment/Plan    PT Assessment Patient needs continued PT services  PT Problem List Decreased strength;Decreased activity tolerance;Decreased balance;Decreased mobility;Decreased knowledge of use of DME;Decreased cognition;Decreased safety awareness;Decreased knowledge of precautions;Cardiopulmonary status limiting activity       PT Treatment Interventions DME instruction;Gait training;Stair training;Functional mobility training;Therapeutic activities;Therapeutic exercise;Balance training;Neuromuscular re-education;Patient/family education    PT Goals (Current goals can be found in the Care Plan section)  Acute Rehab PT Goals Patient Stated Goal: to return to independence PT Goal Formulation: With patient Time For Goal Achievement: 11/24/22 Potential to Achieve Goals: Good    Frequency Min 5X/week (maintain initially, may reduce if progress is limited and SNF remains recommended)     Co-evaluation               AM-PAC PT "6 Clicks" Mobility  Outcome Measure Help needed turning from your back to your side while in a flat bed without using bedrails?: A Lot Help needed moving from lying on your back to sitting on the side of a flat bed without using bedrails?: A Lot Help needed moving to  and from a bed to a chair (including a wheelchair)?: Total Help needed standing up from a chair using your arms (e.g., wheelchair or bedside chair)?: Total Help needed to walk in hospital room?: Total Help needed climbing 3-5 steps with a railing? : Total 6 Click Score: 8    End of Session   Activity Tolerance: Treatment limited secondary to medical complications (Comment) (hypotension) Patient left: in bed;with call bell/phone within reach;with bed alarm set Nurse Communication: Mobility status PT Visit Diagnosis: Other abnormalities of gait and mobility (R26.89);Muscle weakness (generalized) (M62.81)    Time: 9892-1194 PT Time Calculation (min) (ACUTE ONLY): 29 min   Charges:   PT Evaluation $PT Eval Low Complexity: 1 Low          Arlyss Gandy, PT, DPT Acute Rehabilitation Office [phone]   Arlyss Gandy 11/10/2022, 9:41 AM

## 2022-11-11 ENCOUNTER — Ambulatory Visit: Payer: Medicare HMO | Admitting: Family Medicine

## 2022-11-11 LAB — BASIC METABOLIC PANEL
Anion gap: 5 (ref 5–15)
BUN: 17 mg/dL (ref 8–23)
CO2: 23 mmol/L (ref 22–32)
Calcium: 7.2 mg/dL — ABNORMAL LOW (ref 8.9–10.3)
Chloride: 108 mmol/L (ref 98–111)
Creatinine, Ser: 1.28 mg/dL — ABNORMAL HIGH (ref 0.44–1.00)
GFR, Estimated: 45 mL/min — ABNORMAL LOW (ref >60)
Glucose, Bld: 169 mg/dL — ABNORMAL HIGH (ref 70–99)
Potassium: 4.1 mmol/L (ref 3.5–5.1)
Sodium: 136 mmol/L (ref 135–145)

## 2022-11-11 NOTE — Progress Notes (Signed)
Pt still not able to void throughout the night. I&O cath twice on 11/10/2022. Bladder scan of 189 at 0023. Bladder scan of 472 at 0439. Dr. Franky Macho gave verbal order to place foley.

## 2022-11-11 NOTE — Progress Notes (Signed)
   Providing Compassionate, Quality Care - Together  NEUROSURGERY PROGRESS NOTE   S: No issues overnight.  Improving appropriately  O: EXAM:  BP (!) 97/55 (BP Location: Left Arm)   Pulse (!) 111   Temp 99.6 F (37.6 C) (Oral)   Resp 18   Ht 5\' 2"  (1.575 m)   Wt 78.5 kg   SpO2 96%   BMI 31.64 kg/m   Awake, alert, oriented x 3 PERRL Speech fluent, appropriate  CNs grossly intact  5/5 BUE/BLE except for right hip flexor 3/5, KE/DF/PF 4+/5 Abdomen soft, nontender  ASSESSMENT:  71 y.o. female with   Degen scoliosis and stenosis L2-S1   S/p L5-S1 ALIF, L2-5 XLIF, L2-Pelvis instrumentation/fusion 12/6   PLAN: - mobilize -pt/ot -Urine output remained stable -Foley catheter replaced for retention      Thank you for allowing me to participate in this patient's care.  Please do not hesitate to call with questions or concerns.   14/6, DO Neurosurgeon [facility] Cell: [phone]

## 2022-11-11 NOTE — Progress Notes (Signed)
Pt hemavac removed per protocol and order. Clean, dry sterile dsg applied. Will continue to closely monitor pt. Dionne Bucy RN

## 2022-11-11 NOTE — Progress Notes (Signed)
Physical Therapy Treatment Patient Details Name: Kathryn Lynn MRN: [id number] DOB: [birth year] Today's Date: 11/11/2022   History of Present Illness 71 y.o. female presents to [facility] hospital on 11/09/2022 with worsening back pain and LLE radiculopathy. Pt underwent L5-S1 ALIF, L2-5 XLIF and L2-pelvis fusion on 12/6. PMH includes OA, CKD, GERD, abdominal hysterectomy.    PT Comments    Pt tolerates treatment well, progressing to transfers this session. Pt remains groggy and continues to require significant physical assistance to mobilize. Pt also reports new onset R UE weakness since surgery, RN made aware. PT also notes weakness in LUE and impaired motor control. Pt will benefit from aggressive mobilization in an effort to reduce falls risk and to return to independence.   Recommendations for follow up therapy are one component of a multi-disciplinary discharge planning process, led by the attending physician.  Recommendations may be updated based on patient status, additional functional criteria and insurance authorization.  Follow Up Recommendations  Skilled nursing-short term rehab (<3 hours/day) Can patient physically be transported by private vehicle: No   Assistance Recommended at Discharge Intermittent Supervision/Assistance  Patient can return home with the following A lot of help with walking and/or transfers;A lot of help with bathing/dressing/bathroom;Assistance with cooking/housework;Direct supervision/assist for medications management;Direct supervision/assist for financial management;Assist for transportation;Help with stairs or ramp for entrance   Equipment Recommendations  Rolling walker (2 wheels);BSC/3in1    Recommendations for Other Services       Precautions / Restrictions Precautions Precautions: Fall;Back Precaution Booklet Issued: Yes (comment) Required Braces or Orthoses:  (no brace needed) Restrictions Weight Bearing Restrictions: No     Mobility  Bed  Mobility Overal bed mobility: Needs Assistance Bed Mobility: Rolling, Sidelying to Sit Rolling: Mod assist Sidelying to sit: Mod assist, HOB elevated            Transfers Overall transfer level: Needs assistance Equipment used: Rolling walker (2 wheels) Transfers: Sit to/from Stand, Bed to chair/wheelchair/BSC Sit to Stand: Mod assist, From elevated surface   Step pivot transfers: Min assist       General transfer comment: verbal cues for hand placement    Ambulation/Gait                   Stairs             Wheelchair Mobility    Modified Rankin (Stroke Patients Only)       Balance Overall balance assessment: Needs assistance Sitting-balance support: No upper extremity supported, Feet supported Sitting balance-Leahy Scale: Fair     Standing balance support: Single extremity supported, Bilateral upper extremity supported, Reliant on assistive device for balance Standing balance-Leahy Scale: Poor                              Cognition Arousal/Alertness: Awake/alert Behavior During Therapy: WFL for tasks assessed/performed Overall Cognitive Status: Impaired/Different from baseline Area of Impairment: Problem solving                             Problem Solving: Slow processing, Difficulty sequencing          Exercises      General Comments General comments (skin integrity, edema, etc.): BP pre-mobility 115/60, sitting 94/59, in recliner 95/49      Pertinent Vitals/Pain Pain Assessment Pain Assessment: Faces Faces Pain Scale: Hurts even more Pain Location: back Pain Descriptors / Indicators: Grimacing Pain Intervention(s):  Monitored during session    Home Living                          Prior Function            PT Goals (current goals can now be found in the care plan section) Acute Rehab PT Goals Patient Stated Goal: to return to independence Progress towards PT goals: Progressing toward  goals    Frequency    Min 5X/week      PT Plan Current plan remains appropriate    Co-evaluation              AM-PAC PT "6 Clicks" Mobility   Outcome Measure  Help needed turning from your back to your side while in a flat bed without using bedrails?: A Lot Help needed moving from lying on your back to sitting on the side of a flat bed without using bedrails?: A Lot Help needed moving to and from a bed to a chair (including a wheelchair)?: A Little Help needed standing up from a chair using your arms (e.g., wheelchair or bedside chair)?: A Lot Help needed to walk in hospital room?: Total Help needed climbing 3-5 steps with a railing? : Total 6 Click Score: 11    End of Session Equipment Utilized During Treatment: Oxygen Activity Tolerance: Patient tolerated treatment well Patient left: in chair;with call bell/phone within reach;with chair alarm set Nurse Communication: Mobility status;Need for lift equipment (STEDY vs +2 with nursing staff) PT Visit Diagnosis: Other abnormalities of gait and mobility (R26.89);Muscle weakness (generalized) (M62.81)     Time: 3329-5188 PT Time Calculation (min) (ACUTE ONLY): 33 min  Charges:  $Therapeutic Activity: 23-37 mins                     Arlyss Gandy, PT, DPT Acute Rehabilitation Office [phone]    Arlyss Gandy 11/11/2022, 9:32 AM

## 2022-11-12 NOTE — TOC Initial Note (Addendum)
Transition of Care [facility]) - Initial/Assessment Note    Patient Details  Name: Kathryn Lynn MRN: [id number] Date of Birth: [birth year]  Transition of Care [facility]) CM/SW Contact:    [address]oreta [address], [place] Phone Number: 11/12/2022, 12:18 PM  Clinical Narrative:                 CSW met with pt at bedside, discussed recommendations for SNF placement. Pt agreeable, pt requesting a SNF in the [place] area.        Patient Goals and CMS Choice        Expected Discharge Plan and Services                                                Prior Living Arrangements/Services                       Activities of Daily Living Home Assistive Devices/Equipment: Blood pressure cuff, Eyeglasses, Hearing aid ADL Screening (condition at time of admission) Patient's cognitive ability adequate to safely complete daily activities?: Yes Is the patient deaf or have difficulty hearing?: No Does the patient have difficulty seeing, even when wearing glasses/contacts?: No Does the patient have difficulty concentrating, remembering, or making decisions?: No Patient able to express need for assistance with ADLs?: Yes Does the patient have difficulty dressing or bathing?: No Independently performs ADLs?: Yes (appropriate for developmental age) Does the patient have difficulty walking or climbing stairs?: Yes Weakness of Legs: Left Weakness of Arms/Hands: Left  Permission Sought/Granted                  Emotional Assessment              Admission diagnosis:  Lumbar spinal stenosis [M48.061] Patient Active Problem List   Diagnosis Date Noted   Lumbar spinal stenosis 11/09/2022   Hypocortisolemia ([place]) 01/28/2022   Morbid obesity ([place]) 11/19/2021   Impingement syndrome of left shoulder region 08/02/2021   Carpal tunnel syndrome of left wrist 08/02/2021   Pain in joint of left shoulder 07/15/2021   IDA (iron deficiency anemia) 06/30/2021   Arthritis of left knee  11/20/2020   Lumbar radiculopathy 05/21/2020   Obesity (BMI 30-39.9) 11/21/2019   Nocturia 11/21/2019   Osteopenia 11/21/2019   Low back pain 11/21/2019   Chronic kidney disease, stage 3b ([place]) 11/21/2019   Frequency of micturition 11/18/2019   Foraminal stenosis due to intervertebral disc disease 2013   Essential hypertension 05/03/2011   Mixed hyperlipidemia 05/03/2011   PCP:  Ma Hillock, DO Pharmacy:   [facility] [address] [place] [address] [place] [address] Phone: [phone] Fax: [phone]  [facility] [place], [place] [address] Phone: [phone] Fax: [phone]     Social Determinants of Health (SDOH) Interventions    Readmission Risk Interventions     No data to display

## 2022-11-12 NOTE — Progress Notes (Signed)
Mobility Specialist Progress Note   11/12/22 1038  Mobility  Activity Transferred from bed to chair  Level of Assistance Moderate assist, patient does 50-74%  Assistive Device Front wheel walker  Distance Ambulated (ft) 2 ft  Activity Response Tolerated well  $Mobility charge 1 Mobility   Pre Mobility:104 HR, 120/88 BP, 100% SpO2 on 2LO2 During Mobility: 117 HR, 98% SpO2 on 2LO2 Post Mobility: 111 HR, 100% SpO2 on 2LO2  Received in bed c/o L+R glute soreness but agreeable for mobility. ModA to EOB d/t general weakness and decreased pain tolerance in bilat. hips requiring assistance from pad. Able to stand from an elevated surface w/ modA but minA to stand and pivot to chair, verbal cues throughout for hand and foot placement. No faults on transfer but pt requesting pain meds post mobility. Pt left in chair w/ chair alarm on call bell in reach and RN notified.      Frederico Hamman Mobility Specialist Please contact via SecureChat or  Rehab office at [phone]

## 2022-11-12 NOTE — [place] FL2 (Signed)
[place] MEDICAID FL2 LEVEL OF CARE FORM     IDENTIFICATION  Patient Name: RANDELL DETTER Birthdate: [birth year] Sex: female Admission Date (Current Location): 11/09/2022  [facility] and [place] Number:  Producer, television/film/video and Address:  [facility] [place], [address], [place], [place] [address]      Provider Number: [id number]  Attending Physician Name and Address:  Dawley, Alan Mulder, DO  Relative Name and Phone Number:  Shanesha Bednarz [phone]    Current Level of Care: Hospital Recommended Level of Care: Skilled Nursing Facility Prior Approval Number:    Date Approved/Denied:   PASRR Number: [id number]  Discharge Plan: SNF    Current Diagnoses: Patient Active Problem List   Diagnosis Date Noted   Lumbar spinal stenosis 11/09/2022   Hypocortisolemia (HCC) 01/28/2022   Morbid obesity (HCC) 11/19/2021   Impingement syndrome of left shoulder region 08/02/2021   Carpal tunnel syndrome of left wrist 08/02/2021   Pain in joint of left shoulder 07/15/2021   IDA (iron deficiency anemia) 06/30/2021   Arthritis of left knee 11/20/2020   Lumbar radiculopathy 05/21/2020   Obesity (BMI 30-39.9) 11/21/2019   Nocturia 11/21/2019   Osteopenia 11/21/2019   Low back pain 11/21/2019   Chronic kidney disease, stage 3b (HCC) 11/21/2019   Frequency of micturition 11/18/2019   Foraminal stenosis due to intervertebral disc disease 2013   Essential hypertension 05/03/2011   Mixed hyperlipidemia 05/03/2011    Orientation RESPIRATION BLADDER Height & Weight     Self, Time, Situation, Place  O2 Continent, Indwelling catheter Weight: 173 lb (78.5 kg) Height:  5\' 2"  (157.5 cm)  BEHAVIORAL SYMPTOMS/MOOD NEUROLOGICAL BOWEL NUTRITION STATUS      Continent Diet (see dc summary)  AMBULATORY STATUS COMMUNICATION OF NEEDS Skin   Total Care Verbally Surgical wounds (Closed incision 11/09/22-Abdomen, R Flank, Back)                       Personal Care Assistance Level  of Assistance  Bathing, Feeding, Dressing Bathing Assistance: Limited assistance Feeding assistance: Independent Dressing Assistance: Limited assistance     Functional Limitations Info  Sight, Speech, Hearing Sight Info: Adequate Hearing Info: Adequate Speech Info: Adequate    SPECIAL CARE FACTORS FREQUENCY  PT (By licensed PT), OT (By licensed OT)     PT Frequency: 3x week OT Frequency: 3x week            Contractures Contractures Info: Not present    Additional Factors Info  Code Status, Allergies, Psychotropic Code Status Info: Full Allergies Info: Adhesive (Tape)   Cephalosporins   Clindamycin   Latex   Penicillins Psychotropic Info: gabapentin (NEURONTIN) capsule 300 mg 3xday         Current Medications (11/12/2022):  This is the current hospital active medication list Current Facility-Administered Medications  Medication Dose Route Frequency Provider Last Rate Last Admin   0.9 %  sodium chloride infusion  250 mL Intravenous Continuous Dawley, Troy C, DO       0.9 %  sodium chloride infusion   Intravenous Continuous Dawley, Troy C, DO   Stopped at 11/12/22 0948   acetaminophen (TYLENOL) tablet 650 mg  650 mg Oral Q4H PRN Dawley, Troy C, DO   650 mg at 11/12/22 1043   Or   acetaminophen (TYLENOL) suppository 650 mg  650 mg Rectal Q4H PRN Dawley, Troy C, DO       Chlorhexidine Gluconate Cloth 2 % PADS 6 each  6  each Topical Once Dawley, Troy C, DO       And   Chlorhexidine Gluconate Cloth 2 % PADS 6 each  6 each Topical Once Dawley, Troy C, DO       docusate sodium (COLACE) capsule 100 mg  100 mg Oral BID Dawley, Troy C, DO   100 mg at 11/12/22 1001   gabapentin (NEURONTIN) capsule 300 mg  300 mg Oral TID Dawley, Troy C, DO   300 mg at 11/12/22 1001   heparin injection 5,000 Units  5,000 Units Subcutaneous Q8H Dawley, Troy C, DO   5,000 Units at 11/12/22 2025   HYDROmorphone (DILAUDID) injection 1 mg  1 mg Intravenous Q3H PRN Dawley, Troy C, DO   1 mg at 11/10/22  1623   lactated ringers infusion   Intravenous Continuous Dawley, Alan Mulder, DO   New Bag at 11/09/22 1538   meclizine (ANTIVERT) tablet 25 mg  25 mg Oral TID PRN Dawley, Troy C, DO       melatonin tablet 10 mg  10 mg Oral QHS PRN Dawley, Troy C, DO       menthol-cetylpyridinium (CEPACOL) lozenge 3 mg  1 lozenge Oral PRN Dawley, Troy C, DO       Or   phenol (CHLORASEPTIC) mouth spray 1 spray  1 spray Mouth/Throat PRN Dawley, Troy C, DO       methocarbamol (ROBAXIN) tablet 500 mg  500 mg Oral Q6H PRN Dawley, Troy C, DO   500 mg at 11/12/22 0210   Or   methocarbamol (ROBAXIN) 500 mg in dextrose 5 % 50 mL IVPB  500 mg Intravenous Q6H PRN Dawley, Troy C, DO       ondansetron (ZOFRAN) tablet 4 mg  4 mg Oral Q6H PRN Dawley, Troy C, DO       Or   ondansetron (ZOFRAN) injection 4 mg  4 mg Intravenous Q6H PRN Dawley, Troy C, DO       oxyCODONE (Oxy IR/ROXICODONE) immediate release tablet 10 mg  10 mg Oral Q3H PRN Dawley, Troy C, DO   10 mg at 11/11/22 0505   oxyCODONE (Oxy IR/ROXICODONE) immediate release tablet 5 mg  5 mg Oral Q3H PRN Dawley, Troy C, DO   5 mg at 11/12/22 0615   simvastatin (ZOCOR) tablet 20 mg  20 mg Oral QHS Dawley, Troy C, DO   20 mg at 11/11/22 2138   sodium chloride flush (NS) 0.9 % injection 3 mL  3 mL Intravenous Q12H Dawley, Troy C, DO   3 mL at 11/11/22 2142   sodium chloride flush (NS) 0.9 % injection 3 mL  3 mL Intravenous PRN Dawley, Alan Mulder, DO         Discharge Medications: Please see discharge summary for a list of discharge medications.  Relevant Imaging Results:  Relevant Lab Results:   Additional Information SSN [id number]  Carmina Miller, LCSWA

## 2022-11-12 NOTE — Progress Notes (Signed)
Neurosurgery Service Progress Note  Subjective: No acute events overnight, no new complaints   Objective: Vitals:   11/11/22 2331 11/12/22 0341 11/12/22 0741 11/12/22 0926  BP: (!) 122/59 124/62 123/62   Pulse: (!) 113 (!) 105 (!) 107   Resp: 20 20 15    Temp: 99.2 F (37.3 C) (!) 97.5 F (36.4 C) 97.6 F (36.4 C) 99 F (37.2 C)  TempSrc: Oral Oral Oral Oral  SpO2: 98% 100% 100%   Weight:      Height:        Physical Exam: Strength 5/5 x4 and SILTx4 except pain limited hip flexion 2/2 approach  Assessment & Plan: 71 y.o. woman s/p ALIF/XLIF/PSF, recovering well.  -SNF pending  62  11/12/22 9:49 AM

## 2022-11-13 NOTE — Progress Notes (Addendum)
Neurosurgery Service Progress Note  Subjective: No acute events overnight, no new complaints this morning  Objective: Vitals:   11/12/22 2314 11/13/22 0100 11/13/22 0256 11/13/22 0755  BP: (!) 111/53  119/62 (!) 111/53  Pulse: (!) 102  (!) 113 (!) 101  Resp: 20  20 16   Temp: 98.3 F (36.8 C)  98.9 F (37.2 C) 98.1 F (36.7 C)  TempSrc: Oral  Oral Oral  SpO2: 90% 94% 99% 100%  Weight:      Height:        Physical Exam: Strength 5/5 x4 and SILTx4 except pain limited hip flexion 2/2 approach  Assessment & Plan: 71 y.o. woman s/p ALIF/XLIF/PSF, recovering well.  -SNF discharge pending, [place] FL2 co-signed this morning -SCDs/TEDs/SQH  62  11/13/22 11:03 AM

## 2022-11-13 NOTE — Progress Notes (Signed)
Pt OOB to chair with assistance. Pt assessed to have serous filled blisters and couple of the blisters opened to her back. Foam dsg applied. RN will continue to closely monitor pt. Dionne Bucy RN

## 2022-11-14 MED ORDER — SENNA 8.6 MG PO TABS
1.0000 | ORAL_TABLET | Freq: Two times a day (BID) | ORAL | Status: DC | PRN
  Administered 2022-11-14: 8.6 mg via ORAL
  Filled 2022-11-14: qty 1

## 2022-11-14 MED ORDER — POLYETHYLENE GLYCOL 3350 17 G PO PACK
17.0000 g | PACK | Freq: Every day | ORAL | Status: DC
  Administered 2022-11-14 – 2022-11-18 (×5): 17 g via ORAL
  Filled 2022-11-14 (×5): qty 1

## 2022-11-14 NOTE — Progress Notes (Signed)
Pt foley removed at 0900 per order and protocol. Pt due to void. Will continue to closely monitor. Dionne Bucy RN

## 2022-11-14 NOTE — Progress Notes (Signed)
   Providing Compassionate, Quality Care - Together  NEUROSURGERY PROGRESS NOTE   S: No issues overnight.   O: EXAM:  BP 116/71 (BP Location: Left Arm)   Pulse (!) 105   Temp 98.2 F (36.8 C) (Oral)   Resp 17   Ht 5\' 2"  (1.575 m)   Wt 78.5 kg   SpO2 97%   BMI 31.64 kg/m   Awake, alert, oriented x 3 PERRL Speech fluent, appropriate  CNs grossly intact  5/5 BUE/BLE except for right hip flexor 3/5, KE/DF/PF 4+/5 Abdomen soft, nontender   ASSESSMENT:  71 y.o. female with    Degen scoliosis and stenosis L2-S1   S/p L5-S1 ALIF, L2-5 XLIF, L2-Pelvis instrumentation/fusion 12/6   PLAN: -SNF pending -will attempt foley removal once more before rehab placement    Thank you for allowing me to participate in this patient's care.  Please do not hesitate to call with questions or concerns.   14/6, DO Neurosurgeon [facility] Cell: [phone]

## 2022-11-14 NOTE — Progress Notes (Signed)
Pt has voided since foley removal, PVR was 144 earlier and pt voided another clear, yellow urine. Pt encouraged to use call light to call for assistance to either use BR or BSC when there's a need to void. Pt voices understanding. Will continue to closely monitor pt. Dionne Bucy RN

## 2022-11-14 NOTE — Progress Notes (Signed)
Physical Therapy Treatment Patient Details Name: Kathryn Lynn MRN: [id number] DOB: 10-07-1951 Today's Date: 11/14/2022   History of Present Illness 71 y.o. female presents to [facility] hospital on 11/09/2022 with worsening back pain and LLE radiculopathy. Pt underwent L5-S1 ALIF, L2-5 XLIF and L2-pelvis fusion on 12/6. PMH includes OA, CKD, GERD, abdominal hysterectomy.    PT Comments    Pt progressing well towards all goals. Pt's pain more controlled today and was able to ambulate 100' with RW and minA. Pt remains deconditioned with decreased activity tolerance. Pt to cont to benefit from SNF upon d/c to allow for increased time to achieve safe mod I level of function. Acute PT to cont to follow.    Recommendations for follow up therapy are one component of a multi-disciplinary discharge planning process, led by the attending physician.  Recommendations may be updated based on patient status, additional functional criteria and insurance authorization.  Follow Up Recommendations  Skilled nursing-short term rehab (<3 hours/day) Can patient physically be transported by private vehicle: No   Assistance Recommended at Discharge Intermittent Supervision/Assistance  Patient can return home with the following A lot of help with walking and/or transfers;A lot of help with bathing/dressing/bathroom;Assistance with cooking/housework;Direct supervision/assist for medications management;Direct supervision/assist for financial management;Assist for transportation;Help with stairs or ramp for entrance   Equipment Recommendations  Rolling walker (2 wheels);BSC/3in1    Recommendations for Other Services       Precautions / Restrictions Precautions Precautions: Fall;Back Precaution Booklet Issued: Yes (comment) Precaution Comments: pt able to recall 2/3 precautions Required Braces or Orthoses:  (no brace needed) Restrictions Weight Bearing Restrictions: No     Mobility  Bed Mobility Overal bed  mobility: Needs Assistance Bed Mobility: Rolling, Sidelying to Sit Rolling: Mod assist Sidelying to sit: Mod assist, HOB elevated   Sit to supine: Total assist   General bed mobility comments: increased time, directional verbal cues for log rolling, modA for trunk elevation    Transfers Overall transfer level: Needs assistance Equipment used: Rolling walker (2 wheels) Transfers: Sit to/from Stand Sit to Stand: From elevated surface, Min assist   Step pivot transfers: Min assist       General transfer comment: verbal cues for hand placement, increased time, minA to power up    Ambulation/Gait Ambulation/Gait assistance: Min assist, +2 safety/equipment (2nd person for chair follow) Gait Distance (Feet): 100 Feet Assistive device: Rolling walker (2 wheels) Gait Pattern/deviations: Step-through pattern, Decreased stride length Gait velocity: dec Gait velocity interpretation: <1.31 ft/sec, indicative of household ambulator   General Gait Details: began with short step to pattern and progressed to reciprocal more fluid gait pattern with PT maintaining forward propulsion of RW   Stairs             Wheelchair Mobility    Modified Rankin (Stroke Patients Only)       Balance Overall balance assessment: Needs assistance Sitting-balance support: No upper extremity supported, Feet supported Sitting balance-Leahy Scale: Fair Sitting balance - Comments: minG 2/2 symptoms of orthostatic BP   Standing balance support: Single extremity supported, Bilateral upper extremity supported, Reliant on assistive device for balance Standing balance-Leahy Scale: Poor Standing balance comment: requires RW                            Cognition Arousal/Alertness: Awake/alert Behavior During Therapy: WFL for tasks assessed/performed Overall Cognitive Status: Impaired/Different from baseline Area of Impairment: Problem solving  Problem  Solving: Slow processing, Difficulty sequencing General Comments: pt slow to respond but was able to follow all commands        Exercises Other Exercises Other Exercises: passive LAQ to stretch hamstring while in chair, DF of bilat ankles for calf stretch    General Comments General comments (skin integrity, edema, etc.): VSS      Pertinent Vitals/Pain Pain Assessment Pain Assessment: 0-10 Pain Score: 3  Pain Location: back Pain Descriptors / Indicators: Grimacing Pain Intervention(s): Monitored during session    Home Living                          Prior Function            PT Goals (current goals can now be found in the care plan section) Acute Rehab PT Goals Patient Stated Goal: to return to independence PT Goal Formulation: With patient Time For Goal Achievement: 11/24/22 Potential to Achieve Goals: Good Progress towards PT goals: Progressing toward goals    Frequency    Min 5X/week      PT Plan Current plan remains appropriate    Co-evaluation              AM-PAC PT "6 Clicks" Mobility   Outcome Measure  Help needed turning from your back to your side while in a flat bed without using bedrails?: A Lot Help needed moving from lying on your back to sitting on the side of a flat bed without using bedrails?: A Lot Help needed moving to and from a bed to a chair (including a wheelchair)?: A Little Help needed standing up from a chair using your arms (e.g., wheelchair or bedside chair)?: A Lot Help needed to walk in hospital room?: A Lot Help needed climbing 3-5 steps with a railing? : Total 6 Click Score: 12    End of Session Equipment Utilized During Treatment: Oxygen Activity Tolerance: Patient tolerated treatment well Patient left: in chair;with call bell/phone within reach;with chair alarm set Nurse Communication: Mobility status PT Visit Diagnosis: Other abnormalities of gait and mobility (R26.89);Muscle weakness (generalized)  (M62.81)     Time: 0936-1000 PT Time Calculation (min) (ACUTE ONLY): 24 min  Charges:  $Gait Training: 8-22 mins $Therapeutic Exercise: 8-22 mins                     Kathryn Lynn, PT, DPT Acute Rehabilitation Services Secure chat preferred Office #: [phone]    Kathryn Lynn 11/14/2022, 12:45 PM

## 2022-11-14 NOTE — Progress Notes (Signed)
CSW spoke with patient to present her with bed offers - patient chooses [facility].  CSW will initiate insurance authorization.  CSW spoke with Melissa at [facility] to inform her of information.  Edwin Dada, MSW, LCSW Transitions of Care  Clinical Social Worker II [phone]

## 2022-11-14 NOTE — Progress Notes (Signed)
Celene Squibb RN was told by Loura Pardon RN that Dawley, Kendell Bane DO could not find the urine output for the patient foley documented. Rn verbally told Jarvis Morgan that the output was . Patient output was also documented correctly in patients LDA however, when RN removed foley from epic the output was also removed from the intake/output.   To find go to flowsheets, LDA, removed lines for 11/10/2022 , towards the bottom it shows this RN documented yellow clear urine.

## 2022-11-15 ENCOUNTER — Inpatient Hospital Stay (HOSPITAL_COMMUNITY): Payer: Medicare HMO

## 2022-11-15 NOTE — Care Management Important Message (Signed)
Important Message  Patient Details  Name: Kathryn Lynn MRN: [id number] Date of Birth: 25-Jul-1951   Medicare Important Message Given:  Yes     Sherilyn Banker 11/15/2022, 11:51 AM

## 2022-11-15 NOTE — Progress Notes (Signed)
   Providing Compassionate, Quality Care - Together  NEUROSURGERY PROGRESS NOTE   S: No issues overnight. Rehab pending   O: EXAM:  BP (!) 106/51 (BP Location: Right Arm)   Pulse (!) 108   Temp 98.1 F (36.7 C) (Oral)   Resp 18   Ht 5\' 2"  (1.575 m)   Wt 78.5 kg   SpO2 96%   BMI 31.64 kg/m   Awake, alert, oriented x 3 PERRL Speech fluent, appropriate  CNs grossly intact  5/5 BUE/BLE except for right hip flexor 3/5, KE/DF/PF 4+/5 Abdomen soft, nontender   ASSESSMENT:  71 y.o. female with    Degen scoliosis and stenosis L2-S1   S/p L5-S1 ALIF, L2-5 XLIF, L2-Pelvis instrumentation/fusion 12/6   PLAN: -SNF pending -foley out  -+flatus    Thank you for allowing me to participate in this patient's care.  Please do not hesitate to call with questions or concerns.   14/6, DO Neurosurgeon [facility] Cell: [phone]

## 2022-11-15 NOTE — Progress Notes (Signed)
Physical Therapy Treatment Patient Details Name: Kathryn Lynn MRN: [id number] DOB: [birth year] Today's Date: 11/15/2022   History of Present Illness 71 y.o. female presents to [facility] hospital on 11/09/2022 with worsening back pain and LLE radiculopathy. Pt underwent L5-S1 ALIF, L2-5 XLIF and L2-pelvis fusion on 12/6. PMH includes OA, CKD, GERD, abdominal hysterectomy.    PT Comments    Pt limited today by whooziness, onset of being calmy, and nausea when attempting to amb. BP 110/58 however pale in facial color. Suspect orthostatic hypotension. Pt then experience the same symptoms when transferring to [facility] from recliner and then from [facility] to bed. RN did hold IV pain meds until after PT worked with patient so that wasn't a factor. Acute PT to cont to follow.    Recommendations for follow up therapy are one component of a multi-disciplinary discharge planning process, led by the attending physician.  Recommendations may be updated based on patient status, additional functional criteria and insurance authorization.  Follow Up Recommendations  Skilled nursing-short term rehab (<3 hours/day) Can patient physically be transported by private vehicle: No   Assistance Recommended at Discharge Intermittent Supervision/Assistance  Patient can return home with the following A lot of help with walking and/or transfers;A lot of help with bathing/dressing/bathroom;Assistance with cooking/housework;Direct supervision/assist for medications management;Direct supervision/assist for financial management;Assist for transportation;Help with stairs or ramp for entrance   Equipment Recommendations  Rolling walker (2 wheels);BSC/3in1    Recommendations for Other Services       Precautions / Restrictions Precautions Precautions: Fall;Back Precaution Booklet Issued: Yes (comment) Precaution Comments: pt able to recall 2/3 precautions Required Braces or Orthoses:  (no brace needed) Restrictions Weight Bearing  Restrictions: No     Mobility  Bed Mobility Overal bed mobility: Needs Assistance Bed Mobility: Rolling, Sidelying to Sit Rolling: Min assist Sidelying to sit: HOB elevated, Min assist   Sit to supine: Mod assist   General bed mobility comments: increased time, directional verbal cues for log rolling, modA for trunk elevation, modA for LE management back into bed    Transfers Overall transfer level: Needs assistance Equipment used: Rolling walker (2 wheels) Transfers: Sit to/from Stand Sit to Stand: From elevated surface, Min assist   Step pivot transfers: Min assist       General transfer comment: verbal cues for hand placement, increased time, minA to power up    Ambulation/Gait Ambulation/Gait assistance: Min assist, +2 safety/equipment (2nd person for chair follow) Gait Distance (Feet): 15 Feet Assistive device: Rolling walker (2 wheels) Gait Pattern/deviations: Step-through pattern, Decreased stride length Gait velocity: dec     General Gait Details: pt with report of feeling "whoozy" and it didn't improve and then pt began to became nauseated requiring sitting in chair   Stairs             Wheelchair Mobility    Modified Rankin (Stroke Patients Only)       Balance Overall balance assessment: Needs assistance Sitting-balance support: No upper extremity supported, Feet supported Sitting balance-Leahy Scale: Fair     Standing balance support: Single extremity supported, Bilateral upper extremity supported, Reliant on assistive device for balance Standing balance-Leahy Scale: Poor Standing balance comment: requires RW                            Cognition Arousal/Alertness: Awake/alert Behavior During Therapy: WFL for tasks assessed/performed Overall Cognitive Status: Impaired/Different from baseline Area of Impairment: Problem solving  Problem Solving: Slow processing, Difficulty  sequencing General Comments: pt slow to respond but was able to follow all commands        Exercises      General Comments General comments (skin integrity, edema, etc.): pt assited to [facility], pt was able to provide pericare with supervision. BP 110/58 and 114/60 with report of whooziness, pt with noted pale facial color and was clamy      Pertinent Vitals/Pain Pain Assessment Pain Assessment: 0-10 Pain Score: 7  Pain Location: back Pain Descriptors / Indicators: Grimacing Pain Intervention(s): Patient requesting pain meds-RN notified    Home Living                          Prior Function            PT Goals (current goals can now be found in the care plan section) Acute Rehab PT Goals Patient Stated Goal: to return to independence PT Goal Formulation: With patient Time For Goal Achievement: 11/24/22 Potential to Achieve Goals: Good Progress towards PT goals: Not progressing toward goals - comment (limited by whooziness)    Frequency    Min 5X/week      PT Plan Current plan remains appropriate    Co-evaluation              AM-PAC PT "6 Clicks" Mobility   Outcome Measure  Help needed turning from your back to your side while in a flat bed without using bedrails?: A Lot Help needed moving from lying on your back to sitting on the side of a flat bed without using bedrails?: A Lot Help needed moving to and from a bed to a chair (including a wheelchair)?: A Little Help needed standing up from a chair using your arms (e.g., wheelchair or bedside chair)?: A Lot Help needed to walk in hospital room?: A Lot Help needed climbing 3-5 steps with a railing? : Total 6 Click Score: 12    End of Session Equipment Utilized During Treatment: Oxygen Activity Tolerance: Treatment limited secondary to medical complications (Comment) Patient left: with call bell/phone within reach;in bed;with bed alarm set Nurse Communication: Mobility status PT Visit Diagnosis:  Other abnormalities of gait and mobility (R26.89);Muscle weakness (generalized) (M62.81)     Time: 7867-6720 PT Time Calculation (min) (ACUTE ONLY): 32 min  Charges:  $Gait Training: 8-22 mins $Therapeutic Activity: 8-22 mins                     Lewis Shock, PT, DPT Acute Rehabilitation Services Secure chat preferred Office #: [phone]    Iona Hansen 11/15/2022, 1:48 PM

## 2022-11-16 NOTE — Progress Notes (Signed)
   Providing Compassionate, Quality Care - Together  NEUROSURGERY PROGRESS NOTE   S: No issues overnight.   O: EXAM:  BP 102/63 (BP Location: Left Arm)   Pulse (!) 107   Temp 98.3 F (36.8 C) (Oral)   Resp 19   Ht 5\' 2"  (1.575 m)   Wt 78.5 kg   SpO2 93%   BMI 31.64 kg/m   Awake, alert, oriented x 3 PERRL Speech fluent, appropriate  CNs grossly intact  5/5 BUE/BLE except for right hip flexor 3/5, KE/DF/PF 4+/5 Abdomen soft, nontender   ASSESSMENT:  71 y.o. female with    Degen scoliosis and stenosis L2-S1   S/p L5-S1 ALIF, L2-5 XLIF, L2-Pelvis instrumentation/fusion 12/6   PLAN: -SNF pending - +flatus -ate breakfast this am    Thank you for allowing me to participate in this patient's care.  Please do not hesitate to call with questions or concerns.   14/6, DO Neurosurgeon [facility] Cell: [phone]

## 2022-11-16 NOTE — Progress Notes (Signed)
Occupational Therapy Treatment Patient Details Name: Kathryn Lynn MRN: [id number] DOB: [birth year] Today's Date: 11/16/2022   History of present illness 71 y.o. female presents to [facility] hospital on 11/09/2022 with worsening back pain and LLE radiculopathy. Pt underwent L5-S1 ALIF, L2-5 XLIF and L2-pelvis fusion on 12/6. PMH includes OA, CKD, GERD, abdominal hysterectomy.   OT comments  Patient with good progress toward patient focused goals.  She demonstrates improved upper body strength, and her mobility has improved.  Patient needing Min A for basic in room mobility, and up to min-mod A for lower body ADL from a sit to stand level.  OT remains indicated in the acute setting to maximize her functional status and assist with the transition to SNF.     Recommendations for follow up therapy are one component of a multi-disciplinary discharge planning process, led by the attending physician.  Recommendations may be updated based on patient status, additional functional criteria and insurance authorization.    Follow Up Recommendations  Skilled nursing-short term rehab (<3 hours/day)     Assistance Recommended at Discharge Frequent or constant Supervision/Assistance  Patient can return home with the following  Help with stairs or ramp for entrance;Assist for transportation;Assistance with cooking/housework;Two people to help with walking and/or transfers;A lot of help with bathing/dressing/bathroom   Equipment Recommendations       Recommendations for Other Services      Precautions / Restrictions Precautions Precautions: Fall;Back Restrictions Weight Bearing Restrictions: No       Mobility Bed Mobility                    Transfers                         Balance Overall balance assessment: Needs assistance Sitting-balance support: Feet supported Sitting balance-Leahy Scale: Good     Standing balance support: Reliant on assistive device for balance Standing  balance-Leahy Scale: Poor                             ADL either performed or assessed with clinical judgement   ADL       Grooming: Wash/dry hands;Wash/dry face;Min guard;Standing           Upper Body Dressing : Set up;Sitting   Lower Body Dressing: Minimal assistance;Sit to/from stand;Moderate assistance   Toilet Transfer: Higher education careers adviser walker (2 wheels)   Toileting- Clothing Manipulation and Hygiene: Min guard;Sit to/from stand              Extremity/Trunk Assessment Upper Extremity Assessment RUE Deficits / Details: arm strength improving grossly 3+/5 LUE Deficits / Details: arm strength improving grossly 3+/5   Lower Extremity Assessment Lower Extremity Assessment: Defer to PT evaluation        Vision       Perception     Praxis      Cognition Arousal/Alertness: Awake/alert Behavior During Therapy: WFL for tasks assessed/performed Overall Cognitive Status: Within Functional Limits for tasks assessed                                                       General Comments VSS on RA    Pertinent Vitals/ Pain       Pain Assessment Pain Assessment:  Faces Faces Pain Scale: Hurts a little bit Pain Location: back Pain Descriptors / Indicators: Aching Pain Intervention(s): Monitored during session                                                          Frequency  Min 2X/week        Progress Toward Goals  OT Goals(current goals can now be found in the care plan section)  Progress towards OT goals: Progressing toward goals  Acute Rehab OT Goals OT Goal Formulation: With patient Time For Goal Achievement: 11/24/22 Potential to Achieve Goals: Good ADL Goals Pt Will Perform Grooming: with modified independence;standing Pt Will Perform Lower Body Dressing: sit to/from stand;with modified independence Pt Will Transfer to Toilet: with modified  independence;ambulating;regular height toilet Pt/caregiver will Perform Home Exercise Program: Increased strength;Both right and left upper extremity;With theraband;With Supervision  Plan Discharge plan remains appropriate    Co-evaluation                 AM-PAC OT "6 Clicks" Daily Activity     Outcome Measure   Help from another person eating meals?: None Help from another person taking care of personal grooming?: A Little Help from another person toileting, which includes using toliet, bedpan, or urinal?: A Little Help from another person bathing (including washing, rinsing, drying)?: A Lot Help from another person to put on and taking off regular upper body clothing?: A Little Help from another person to put on and taking off regular lower body clothing?: A Lot 6 Click Score: 17    End of Session Equipment Utilized During Treatment: Rolling walker (2 wheels)  OT Visit Diagnosis: Unsteadiness on feet (R26.81);Muscle weakness (generalized) (M62.81);Pain Pain - Right/Left: Right Pain - part of body: Leg   Activity Tolerance Patient tolerated treatment well   Patient Left with call bell/phone within reach;in chair   Nurse Communication Mobility status        Time: 1209-1221 OT Time Calculation (min): 12 min  Charges: OT General Charges $OT Visit: 1 Visit OT Treatments $Self Care/Home Management : 8-22 mins  11/16/2022  RP, OTR/L  Acute Rehabilitation Services  Office:  [phone]   Kathryn Lynn 11/16/2022, 12:26 PM

## 2022-11-16 NOTE — TOC Progression Note (Signed)
Transition of Care [facility]) - Progression Note    Patient Details  Name: Kathryn Lynn MRN: [id number] Date of Birth: February 27, 1951  Transition of Care [facility]) CM/SW Contact  Eduard Roux, [place] Phone Number: 11/16/2022, 2:53 PM  Clinical Narrative:     Insurance authorization pending reference # [id number]  Expected Discharge Plan: Skilled Nursing Facility Barriers to Discharge: Insurance Authorization  Expected Discharge Plan and Services Expected Discharge Plan: Skilled Nursing Facility       Living arrangements for the past 2 months: Single Family Home                                       Social Determinants of Health (SDOH) Interventions    Readmission Risk Interventions     No data to display

## 2022-11-16 NOTE — Progress Notes (Signed)
Physical Therapy Treatment Patient Details Name: Kathryn Lynn MRN: [id number] DOB: [birth year] Today's Date: 11/16/2022   History of Present Illness 71 y.o. female presents to [facility] hospital on 11/09/2022 with worsening back pain and LLE radiculopathy. Pt underwent L5-S1 ALIF, L2-5 XLIF and L2-pelvis fusion on 12/6. PMH includes OA, CKD, GERD, abdominal hysterectomy.    PT Comments    Pt received in bed, very sleepy. States she has not slept well for several days and feels like it is catching up with her. Agreeable to OOB to chair, but declining gait progression. She required min assist sidelying to sit, min assist sit to stand, and min assist amb 10' with RW. Pt in recliner with feet elevated at end of session.  Spoke with RN regarding transition to [facility] instead of [facility].     Recommendations for follow up therapy are one component of a multi-disciplinary discharge planning process, led by the attending physician.  Recommendations may be updated based on patient status, additional functional criteria and insurance authorization.  Follow Up Recommendations  Skilled nursing-short term rehab (<3 hours/day) Can patient physically be transported by private vehicle: Yes   Assistance Recommended at Discharge Frequent or constant Supervision/Assistance  Patient can return home with the following A lot of help with walking and/or transfers;A lot of help with bathing/dressing/bathroom;Assistance with cooking/housework;Direct supervision/assist for medications management;Direct supervision/assist for financial management;Assist for transportation;Help with stairs or ramp for entrance   Equipment Recommendations  Rolling walker (2 wheels);BSC/3in1    Recommendations for Other Services       Precautions / Restrictions Precautions Precautions: Fall;Back     Mobility  Bed Mobility Overal bed mobility: Needs Assistance Bed Mobility: Rolling, Sidelying to Sit Rolling: Min guard Sidelying to sit:  HOB elevated, Min assist       General bed mobility comments: +rail, increased time, assist to elevate trunk    Transfers Overall transfer level: Needs assistance Equipment used: Rolling walker (2 wheels) Transfers: Sit to/from Stand Sit to Stand: From elevated surface, Min assist           General transfer comment: cues for hand placement, assist to power up    Ambulation/Gait Ambulation/Gait assistance: Min assist Gait Distance (Feet): 10 Feet Assistive device: Rolling walker (2 wheels)   Gait velocity: decreased     General Gait Details: distance limited by grogginess/fatigue.   Stairs             Wheelchair Mobility    Modified Rankin (Stroke Patients Only)       Balance Overall balance assessment: Needs assistance Sitting-balance support: No upper extremity supported, Feet supported Sitting balance-Leahy Scale: Fair     Standing balance support: Bilateral upper extremity supported, Reliant on assistive device for balance, During functional activity Standing balance-Leahy Scale: Poor                              Cognition Arousal/Alertness: Lethargic Behavior During Therapy: Flat affect Overall Cognitive Status: Impaired/Different from baseline Area of Impairment: Problem solving                             Problem Solving: Slow processing, Difficulty sequencing, Requires verbal cues General Comments: Pt reports she has not been sleeping well and feels it has finally caught up with her. Very sleepy/lethargic. Falling asleep without constant stimuli/conversation.        Exercises      General  Comments General comments (skin integrity, edema, etc.): VSS on RA      Pertinent Vitals/Pain Pain Assessment Pain Assessment: Faces Faces Pain Scale: Hurts little more Pain Location: back Pain Descriptors / Indicators: Grimacing Pain Intervention(s): Repositioned, Monitored during session    Home Living                           Prior Function            PT Goals (current goals can now be found in the care plan section) Acute Rehab PT Goals Patient Stated Goal: to return to independence Progress towards PT goals: Progressing toward goals    Frequency    Min 5X/week      PT Plan Current plan remains appropriate    Co-evaluation              AM-PAC PT "6 Clicks" Mobility   Outcome Measure  Help needed turning from your back to your side while in a flat bed without using bedrails?: A Little Help needed moving from lying on your back to sitting on the side of a flat bed without using bedrails?: A Lot Help needed moving to and from a bed to a chair (including a wheelchair)?: A Little Help needed standing up from a chair using your arms (e.g., wheelchair or bedside chair)?: A Little Help needed to walk in hospital room?: A Lot Help needed climbing 3-5 steps with a railing? : Total 6 Click Score: 14    End of Session Equipment Utilized During Treatment: Gait belt Activity Tolerance: Patient limited by lethargy Patient left: in chair;with call bell/phone within reach;with chair alarm set Nurse Communication: Mobility status PT Visit Diagnosis: Other abnormalities of gait and mobility (R26.89);Muscle weakness (generalized) (M62.81)     Time: 9381-0175 PT Time Calculation (min) (ACUTE ONLY): 14 min  Charges:  $Gait Training: 8-22 mins                     Aida Raider, PT  Office # [phone] Pager [phone]    Ilda Foil 11/16/2022, 12:08 PM

## 2022-11-17 NOTE — Progress Notes (Signed)
   Providing Compassionate, Quality Care - Together  NEUROSURGERY PROGRESS NOTE   S: No issues overnight.   O: EXAM:  BP (!) 106/44 (BP Location: Left Arm)   Pulse (!) 102   Temp 98.3 F (36.8 C) (Oral)   Resp 16   Ht 5\' 2"  (1.575 m)   Wt 78.5 kg   SpO2 97%   BMI 31.64 kg/m   Awake, alert, oriented x 3 PERRL Speech fluent, appropriate  CNs grossly intact  5/5 BUE/BLE except for right hip flexor 3/5, KE/DF/PF 4+/5 Abdomen soft, nontender   ASSESSMENT:  71 y.o. female with    Degen scoliosis and stenosis L2-S1   S/p L5-S1 ALIF, L2-5 XLIF, L2-Pelvis instrumentation/fusion 12/6   PLAN: -SNF likely tomorrow     Thank you for allowing me to participate in this patient's care.  Please do not hesitate to call with questions or concerns.   14/6, DO Neurosurgeon [facility] Cell: [phone]

## 2022-11-17 NOTE — Progress Notes (Signed)
Physical Therapy Treatment Patient Details Name: Kathryn Lynn MRN: [id number] DOB: Jul 22, 1951 Today's Date: 11/17/2022   History of Present Illness 71 y.o. female presents to [facility] hospital on 11/09/2022 with worsening back pain and LLE radiculopathy. Pt underwent L5-S1 ALIF, L2-5 XLIF and L2-pelvis fusion on 12/6. PMH includes OA, CKD, GERD, abdominal hysterectomy.    PT Comments    Pt tolerates treatment well with improved ambulation distance. Pt without report of orthostatic symptoms this session. Pt requiring assistance to perform bed mobility with lack of core strength to ascend into sitting. Pt will benefit from continued aggressive mobilization in an effort to improve activity tolerance and to restore independence. PT recommends SNF placement.   Recommendations for follow up therapy are one component of a multi-disciplinary discharge planning process, led by the attending physician.  Recommendations may be updated based on patient status, additional functional criteria and insurance authorization.  Follow Up Recommendations  Skilled nursing-short term rehab (<3 hours/day) Can patient physically be transported by private vehicle: Yes   Assistance Recommended at Discharge Intermittent Supervision/Assistance  Patient can return home with the following A little help with walking and/or transfers;A little help with bathing/dressing/bathroom;Assistance with cooking/housework;Assist for transportation;Help with stairs or ramp for entrance   Equipment Recommendations  Rolling walker (2 wheels);BSC/3in1    Recommendations for Other Services       Precautions / Restrictions Precautions Precautions: Fall;Back Precaution Booklet Issued: Yes (comment) Precaution Comments: recalls 2/3 back precautions, requires cues for lifitng restriction Required Braces or Orthoses:  (no brace) Restrictions Weight Bearing Restrictions: No     Mobility  Bed Mobility Overal bed mobility: Needs  Assistance Bed Mobility: Rolling, Sidelying to Sit Rolling: Min guard Sidelying to sit: Min assist, HOB elevated       General bed mobility comments: use of railing, assist to elevate trunk. Pt allowed multiple attempts to ascend into sitting but unable to without PT assist this morning    Transfers Overall transfer level: Needs assistance Equipment used: Rolling walker (2 wheels) Transfers: Sit to/from Stand Sit to Stand: Min guard                Ambulation/Gait Ambulation/Gait assistance: Min guard Gait Distance (Feet): 100 Feet Assistive device: Rolling walker (2 wheels) Gait Pattern/deviations: Step-through pattern Gait velocity: reduced Gait velocity interpretation: <1.8 ft/sec, indicate of risk for recurrent falls   General Gait Details: slowed step-through gait   Stairs             Wheelchair Mobility    Modified Rankin (Stroke Patients Only)       Balance Overall balance assessment: Needs assistance Sitting-balance support: No upper extremity supported, Feet supported Sitting balance-Leahy Scale: Good     Standing balance support: Single extremity supported, Reliant on assistive device for balance Standing balance-Leahy Scale: Poor                              Cognition Arousal/Alertness: Awake/alert Behavior During Therapy: WFL for tasks assessed/performed Overall Cognitive Status: Within Functional Limits for tasks assessed                                 General Comments: pt reports some disorientation to time, mixing up day/night yesterday. Window blind opened to allow for increased sunlight to enter room        Exercises      General Comments General comments (  skin integrity, edema, etc.): VSS on RA      Pertinent Vitals/Pain Pain Assessment Pain Assessment: Faces Faces Pain Scale: Hurts little more Pain Location: back Pain Descriptors / Indicators: Grimacing Pain Intervention(s): Monitored during  session    Home Living                          Prior Function            PT Goals (current goals can now be found in the care plan section) Acute Rehab PT Goals Patient Stated Goal: to return to independence Progress towards PT goals: Progressing toward goals    Frequency    Min 5X/week      PT Plan Current plan remains appropriate    Co-evaluation              AM-PAC PT "6 Clicks" Mobility   Outcome Measure  Help needed turning from your back to your side while in a flat bed without using bedrails?: A Little Help needed moving from lying on your back to sitting on the side of a flat bed without using bedrails?: A Little Help needed moving to and from a bed to a chair (including a wheelchair)?: A Little Help needed standing up from a chair using your arms (e.g., wheelchair or bedside chair)?: A Little Help needed to walk in hospital room?: A Little Help needed climbing 3-5 steps with a railing? : Total 6 Click Score: 16    End of Session   Activity Tolerance: Patient tolerated treatment well Patient left: in chair;with call bell/phone within reach;with chair alarm set Nurse Communication: Mobility status PT Visit Diagnosis: Other abnormalities of gait and mobility (R26.89);Muscle weakness (generalized) (M62.81)     Time: 7342-8768 PT Time Calculation (min) (ACUTE ONLY): 24 min  Charges:  $Gait Training: 8-22 mins $Therapeutic Activity: 8-22 mins                     Arlyss Gandy, PT, DPT Acute Rehabilitation Office [phone]    Arlyss Gandy 11/17/2022, 10:15 AM

## 2022-11-17 NOTE — TOC Progression Note (Signed)
Transition of Care [facility]) - Progression Note    Patient Details  Name: Kathryn Lynn MRN: [id number] Date of Birth: 1951/07/19  [facility] Care [facility]) CM/SW Contact  Eduard Roux, [place] Phone Number: 11/17/2022, 4:30 PM  Clinical Narrative:     Patient has received authorization for SNF-   Confirmed bed availability with [facility]- if patient remains stable they can admit tomorrow.  TOC will continue to follow and assist with discharge planning.  Antony Blackbird, MSW, LCSW Clinical Social Worker    Expected Discharge Plan: Skilled Nursing Facility Barriers to Discharge: Insurance Authorization  Expected Discharge Plan and Services Expected Discharge Plan: Skilled Nursing Facility       Living arrangements for the past 2 months: Single Family Home                                       Social Determinants of Health (SDOH) Interventions    Readmission Risk Interventions     No data to display

## 2022-11-18 DIAGNOSIS — M1990 Unspecified osteoarthritis, unspecified site: Secondary | ICD-10-CM | POA: Diagnosis not present

## 2022-11-18 DIAGNOSIS — D649 Anemia, unspecified: Secondary | ICD-10-CM | POA: Diagnosis not present

## 2022-11-18 DIAGNOSIS — Z7189 Other specified counseling: Secondary | ICD-10-CM | POA: Diagnosis not present

## 2022-11-18 DIAGNOSIS — R29898 Other symptoms and signs involving the musculoskeletal system: Secondary | ICD-10-CM | POA: Diagnosis not present

## 2022-11-18 DIAGNOSIS — R531 Weakness: Secondary | ICD-10-CM | POA: Diagnosis not present

## 2022-11-18 DIAGNOSIS — Z4782 Encounter for orthopedic aftercare following scoliosis surgery: Secondary | ICD-10-CM | POA: Diagnosis not present

## 2022-11-18 DIAGNOSIS — M48061 Spinal stenosis, lumbar region without neurogenic claudication: Secondary | ICD-10-CM | POA: Diagnosis not present

## 2022-11-18 DIAGNOSIS — E782 Mixed hyperlipidemia: Secondary | ICD-10-CM | POA: Diagnosis not present

## 2022-11-18 DIAGNOSIS — Z7401 Bed confinement status: Secondary | ICD-10-CM | POA: Diagnosis not present

## 2022-11-18 DIAGNOSIS — K219 Gastro-esophageal reflux disease without esophagitis: Secondary | ICD-10-CM | POA: Diagnosis not present

## 2022-11-18 DIAGNOSIS — M419 Scoliosis, unspecified: Secondary | ICD-10-CM | POA: Diagnosis not present

## 2022-11-18 DIAGNOSIS — Z4789 Encounter for other orthopedic aftercare: Secondary | ICD-10-CM | POA: Diagnosis not present

## 2022-11-18 DIAGNOSIS — N1831 Chronic kidney disease, stage 3a: Secondary | ICD-10-CM | POA: Diagnosis not present

## 2022-11-18 DIAGNOSIS — R52 Pain, unspecified: Secondary | ICD-10-CM | POA: Diagnosis not present

## 2022-11-18 MED ORDER — METHOCARBAMOL 500 MG PO TABS: 500.0000 mg | ORAL_TABLET | Freq: Four times a day (QID) | ORAL | 2 refills | Status: DC | PRN

## 2022-11-18 MED ORDER — OXYCODONE HCL 5 MG PO TABS: 5.0000 mg | ORAL_TABLET | ORAL | 0 refills | Status: DC | PRN

## 2022-11-18 NOTE — Care Management Important Message (Signed)
Important Message  Patient Details  Name: Kathryn Lynn MRN: [id number] Date of Birth: 07/08/1951   Medicare Important Message Given:  Yes     Sherilyn Banker 11/18/2022, 12:00 PM

## 2022-11-18 NOTE — Progress Notes (Signed)
Report attempted x 2 without answer. PTAR on unit to transport pt given discharge packet, prescription and all belongings. Pt assessment and VS documented pt is stable. PIV removed.

## 2022-11-18 NOTE — Progress Notes (Signed)
Mobility Specialist Progress Note   11/18/22 1436  Mobility  Activity Ambulated with assistance in hallway  Level of Assistance Standby assist, set-up cues, supervision of patient - no hands on  Assistive Device Front wheel walker  Distance Ambulated (ft) 180 ft  Activity Response Tolerated well  $Mobility charge 1 Mobility   Received in bed having small c/o glute soreness but agreeable. Pt able to recall 3/3 back precautions prior to ambulation. No physical assist throughout session, no faults in hallway. Returned back to bed w/ needs met awaiting d/c.   Holland Falling Mobility Specialist Please contact via SecureChat or  Rehab office at [phone]

## 2022-11-18 NOTE — Discharge Instructions (Signed)
OK TO REMOVE STAPLES 14 days POSTOP

## 2022-11-18 NOTE — TOC Transition Note (Signed)
Transition of Care [facility]) - CM/SW Discharge Note   Patient Details  Name: Kathryn Lynn MRN: [id number] Date of Birth: 06-16-1951  Transition of Care [facility]) CM/SW Contact:  Eduard Roux, LCSW Phone Number: 11/18/2022, 12:55 PM   Clinical Narrative:     Patient will Discharge to: [facility] Discharge Date: 11/18/2022 Family Notified: spouse Transport By: Sharin Mons  Per MD patient is ready for discharge. RN, patient, and facility notified of discharge. Discharge Summary sent to facility. RN given number for [phone]. Ambulance transport requested for patient.   Clinical Social Worker signing off.  Antony Blackbird, MSW, LCSW Clinical Social Worker      Final next level of care: Skilled Nursing Facility Barriers to Discharge: Barriers Resolved   Patient Goals and CMS Choice Patient states their goals for this hospitalization and ongoing recovery are:: get stronger      Discharge Placement              Patient [facility] bed at: [facility] Patient to be transferred to facility by: PTAR Name of family member notified: spouse Patient and family notified of of transfer: 11/18/22  Discharge Plan and Services                                     Social Determinants of Health (SDOH) Interventions     Readmission Risk Interventions     No data to display

## 2022-11-18 NOTE — Progress Notes (Signed)
Occupational Therapy Treatment Patient Details Name: Kathryn Lynn MRN: [id number] DOB: [birth year] Today's Date: 11/18/2022   History of present illness 71 y.o. female presents to [facility] hospital on 11/09/2022 with worsening back pain and LLE radiculopathy. Pt underwent L5-S1 ALIF, L2-5 XLIF and L2-pelvis fusion on 12/6. PMH includes OA, CKD, GERD, abdominal hysterectomy.   OT comments  Patient continues with good progression toward patient focused goals.  Patient advised she will be discharging this date to SNF for continued rehab prior to returning home.  Patient continues to show improving upper body strength and coordination, and should attain a Mod I level at the next level of care.  OT will continue to follow should she remain in the acute setting to maximize her functional status.     Recommendations for follow up therapy are one component of a multi-disciplinary discharge planning process, led by the attending physician.  Recommendations may be updated based on patient status, additional functional criteria and insurance authorization.    Follow Up Recommendations  Skilled nursing-short term rehab (<3 hours/day)     Assistance Recommended at Discharge Frequent or constant Supervision/Assistance  Patient can return home with the following  Help with stairs or ramp for entrance;Assist for transportation;Assistance with cooking/housework;Two people to help with walking and/or transfers;A lot of help with bathing/dressing/bathroom   Equipment Recommendations  None recommended by OT    Recommendations for Other Services      Precautions / Restrictions Precautions Precautions: Fall;Back Precaution Booklet Issued: Yes (comment) Precaution Comments: able to recall 3/3 back precautions Restrictions Weight Bearing Restrictions: No       Mobility Bed Mobility               General bed mobility comments: received and left in recliner    Transfers   Equipment used: Rolling  walker (2 wheels) Transfers: Sit to/from Stand Sit to Stand: Min guard                 Balance Overall balance assessment: Needs assistance Sitting-balance support: No upper extremity supported, Feet supported Sitting balance-Leahy Scale: Good     Standing balance support: Reliant on assistive device for balance Standing balance-Leahy Scale: Fair                             ADL either performed or assessed with clinical judgement   ADL       Grooming: Wash/dry hands;Wash/dry face;Min guard;Standing               Lower Body Dressing: Sit to/from stand;Moderate assistance;Minimal assistance   Toilet Transfer: Higher education careers adviser walker (2 wheels)   Toileting- Clothing Manipulation and Hygiene: Min guard;Sit to/from stand              Extremity/Trunk Assessment Upper Extremity Assessment RUE Deficits / Details: arm strength continues to improve, grossly 3+/5 RUE Sensation: decreased light touch RUE Coordination: decreased fine motor LUE Deficits / Details: arm strength improving grossly 3+/5 LUE Sensation: decreased light touch LUE Coordination: decreased fine motor       Cervical / Trunk Assessment Cervical / Trunk Assessment: Back Surgery    Vision       Perception     Praxis      Cognition Arousal/Alertness: Awake/alert Behavior During Therapy: WFL for tasks assessed/performed Overall Cognitive Status: Within Functional Limits for tasks assessed  4             General Comments VSS on RA    Pertinent Vitals/ Pain       Pain Assessment Pain Assessment: Faces Faces Pain Scale: Hurts little more Pain Location: R thigh, glutes Pain Descriptors / Indicators: Sore, Aching Pain Intervention(s): Monitored during session                                                          Frequency  Min 2X/week        Progress Toward  Goals  OT Goals(current goals can now be found in the care plan section)  Progress towards OT goals: Progressing toward goals  Acute Rehab OT Goals OT Goal Formulation: With patient Time For Goal Achievement: 11/24/22 Potential to Achieve Goals: Good  Plan Discharge plan remains appropriate    Co-evaluation                 AM-PAC OT "6 Clicks" Daily Activity     Outcome Measure   Help from another person eating meals?: None Help from another person taking care of personal grooming?: A Little Help from another person toileting, which includes using toliet, bedpan, or urinal?: A Little Help from another person bathing (including washing, rinsing, drying)?: A Lot Help from another person to put on and taking off regular upper body clothing?: A Little Help from another person to put on and taking off regular lower body clothing?: A Lot 6 Click Score: 17    End of Session Equipment Utilized During Treatment: Rolling walker (2 wheels)  OT Visit Diagnosis: Unsteadiness on feet (R26.81);Muscle weakness (generalized) (M62.81);Pain Pain - Right/Left: Right Pain - part of body: Leg   Activity Tolerance Patient tolerated treatment well   Patient Left with call bell/phone within reach;in chair;with chair alarm set   Nurse Communication Mobility status        Time: [id number] OT Time Calculation (min): 14 min  Charges: OT General Charges $OT Visit: 1 Visit OT Treatments $Self Care/Home Management : 8-22 mins  11/18/2022  RP, OTR/L  Acute Rehabilitation Services  Office:  [phone]   Metta Clines 11/18/2022, 11:43 AM

## 2022-11-18 NOTE — Progress Notes (Signed)
Physical Therapy Treatment Patient Details Name: Kathryn Lynn MRN: [id number] DOB: [birth year] Today's Date: 11/18/2022   History of Present Illness 71 y.o. female presents to [facility] hospital on 11/09/2022 with worsening back pain and LLE radiculopathy. Pt underwent L5-S1 ALIF, L2-5 XLIF and L2-pelvis fusion on 12/6. PMH includes OA, CKD, GERD, abdominal hysterectomy.    PT Comments    Pt tolerates treatment well, demonstrating improved ease of transfers and increased ambulation tolerance. Pt reports soreness and fatigue in glutes when ambulating. PT encourages frequent mobilization to reduce stiffness and to improve endurance. PT continues to recommend SNF placement as the pt has little caregiver support available at the time of discharge and will need to largely be independent prior to return home.   Recommendations for follow up therapy are one component of a multi-disciplinary discharge planning process, led by the attending physician.  Recommendations may be updated based on patient status, additional functional criteria and insurance authorization.  Follow Up Recommendations  Skilled nursing-short term rehab (<3 hours/day) Can patient physically be transported by private vehicle: Yes   Assistance Recommended at Discharge Intermittent Supervision/Assistance  Patient can return home with the following A little help with walking and/or transfers;A little help with bathing/dressing/bathroom;Assistance with cooking/housework;Assist for transportation;Help with stairs or ramp for entrance   Equipment Recommendations  Rolling walker (2 wheels);BSC/3in1    Recommendations for Other Services       Precautions / Restrictions Precautions Precautions: Fall;Back Precaution Booklet Issued: Yes (comment) Restrictions Weight Bearing Restrictions: No     Mobility  Bed Mobility               General bed mobility comments: received and left in recliner    Transfers Overall transfer  level: Needs assistance Equipment used: Rolling walker (2 wheels) Transfers: Sit to/from Stand Sit to Stand: Min guard                Ambulation/Gait Ambulation/Gait assistance: Min guard Gait Distance (Feet): 175 Feet Assistive device: Rolling walker (2 wheels) Gait Pattern/deviations: Step-through pattern Gait velocity: reduced Gait velocity interpretation: <1.8 ft/sec, indicate of risk for recurrent falls   General Gait Details: slowed step-through gait, decreased stride length with fatigue   Stairs             Wheelchair Mobility    Modified Rankin (Stroke Patients Only)       Balance Overall balance assessment: Needs assistance Sitting-balance support: No upper extremity supported, Feet supported Sitting balance-Leahy Scale: Good     Standing balance support: Single extremity supported, Reliant on assistive device for balance Standing balance-Leahy Scale: Poor                              Cognition Arousal/Alertness: Awake/alert Behavior During Therapy: WFL for tasks assessed/performed Overall Cognitive Status: Within Functional Limits for tasks assessed                                          Exercises      General Comments General comments (skin integrity, edema, etc.): VSS on RA      Pertinent Vitals/Pain Pain Assessment Pain Assessment: Faces Faces Pain Scale: Hurts little more Pain Location: R thigh, glutes Pain Descriptors / Indicators: Sore Pain Intervention(s): Monitored during session    Home Living  Prior Function            PT Goals (current goals can now be found in the care plan section) Acute Rehab PT Goals Patient Stated Goal: to return to independence Progress towards PT goals: Progressing toward goals    Frequency    Min 5X/week      PT Plan Current plan remains appropriate    Co-evaluation              AM-PAC PT "6 Clicks" Mobility    Outcome Measure  Help needed turning from your back to your side while in a flat bed without using bedrails?: A Little Help needed moving from lying on your back to sitting on the side of a flat bed without using bedrails?: A Little Help needed moving to and from a bed to a chair (including a wheelchair)?: A Little Help needed standing up from a chair using your arms (e.g., wheelchair or bedside chair)?: A Little Help needed to walk in hospital room?: A Little Help needed climbing 3-5 steps with a railing? : Total 6 Click Score: 16    End of Session   Activity Tolerance: Patient tolerated treatment well Patient left: in chair;with call bell/phone within reach Nurse Communication: Mobility status PT Visit Diagnosis: Other abnormalities of gait and mobility (R26.89);Muscle weakness (generalized) (M62.81)     Time: 4010-2725 PT Time Calculation (min) (ACUTE ONLY): 12 min  Charges:  $Gait Training: 8-22 mins                     Arlyss Gandy, PT, DPT Acute Rehabilitation Office [phone]    Arlyss Gandy 11/18/2022, 9:21 AM

## 2022-11-18 NOTE — Discharge Summary (Signed)
Physician Discharge Summary  Patient ID: LACHLAN PELTO MRN: [id number] DOB/AGE: 1951-09-23 71 y.o.  Admit date: 11/09/2022 Discharge date: 11/18/2022  Admission Diagnoses:  L2-S1 Spondylosis with coronal and sagittal imbalance Lumbar stenosis with radiculopathy L4-S1  Discharge Diagnoses:  Same Principal Problem:   Lumbar spinal stenosis   Discharged Condition: Stable  Hospital Course:  Kathryn Lynn is a 71 y.o. female who underwent an elective L5-S1 ALIF, L2-5 XLIF (right-sided approach), with posterior instrumentation and fusion L2-pelvis.  She tolerated surgery well.  Postoperatively she improved appropriately, PT OT recommended SNF placement.  Her wounds were healing appropriately, she was having normal bowel bladder function upon discharge.  Her pain was controlled on oral medication.  She was ambulating with minimal assistance.  Her preoperative radiculopathy was improved.  She had some with slight right lower extremity pain and weakness which was related to retraction due to XLIF.  Treatments: Surgery - ALIF L5-S1/XLIF L2-5/Posterior L2- pelvis instrumentation and fusion  Discharge Exam: Blood pressure (!) 100/41, pulse 100, temperature 98 F (36.7 C), temperature source Oral, resp. rate 17, height 5\' 2"  (1.575 m), weight 78.5 kg, SpO2 97 %. Awake, alert, oriented x3 Speech fluent, appropriate CN grossly intact 5/5 BUE 4+/5 BLE Wounds c/d/I Abd soft, nontender  Disposition: Discharge disposition: 03-Skilled Nursing Facility       Discharge Instructions     Incentive spirometry RT   Complete by: As directed       Allergies as of 11/18/2022       Reactions   Adhesive [tape] Rash   Cephalosporins Rash   Clindamycin Palpitations   Latex Rash   Penicillins Rash        Medication List     STOP taking these medications    traMADol 50 MG tablet Commonly known as: ULTRAM       TAKE these medications    CALCIUM 600 + D PO Take 1 tablet by  mouth daily.   cetirizine 10 MG tablet Commonly known as: ZYRTEC Take 10 mg by mouth daily.   cyclobenzaprine 5 MG tablet Commonly known as: FLEXERIL TAKE 1 TABLET AT BEDTIME AS NEEDED FOR MUSCLE SPASM(S)   diclofenac Sodium 1 % Gel Commonly known as: Voltaren Apply 2-4 g topically 4 (four) times daily. What changed:  when to take this reasons to take this   fluticasone 50 MCG/ACT nasal spray Commonly known as: FLONASE Place 1 spray into both nostrils 2 (two) times daily.   gabapentin 300 MG capsule Commonly known as: NEURONTIN Take 1 capsule (300 mg total) by mouth 3 (three) times daily. What changed: when to take this   magnesium oxide 400 (240 Mg) MG tablet Commonly known as: MAG-OX Take 400 mg by mouth daily.   meclizine 25 MG tablet Commonly known as: ANTIVERT Take 25 mg by mouth 3 (three) times daily as needed for dizziness.   Melatonin 10 MG Tabs Take 10 mg by mouth at bedtime as needed (sleep).   methocarbamol 500 MG tablet Commonly known as: ROBAXIN Take 1 tablet (500 mg total) by mouth every 6 (six) hours as needed for muscle spasms.   Omega-3 Fish Oil 300 MG Caps Take 300 mg by mouth daily in the afternoon.   oxyCODONE 5 MG immediate release tablet Commonly known as: Oxy IR/ROXICODONE Take 1 tablet (5 mg total) by mouth every 4 (four) hours as needed for moderate pain ((score 4 to 6)).   simvastatin 20 MG tablet Commonly known as: ZOCOR Take 1 tablet (20 mg  total) by mouth at bedtime.   Vitamin D (Cholecalciferol) 25 MCG (1000 UT) Caps Take 1,000 Units by mouth daily.   vitamin E 180 MG (400 UNITS) capsule Take 400 Units by mouth daily.        Contact information for follow-up providers     Blade Scheff C, DO Follow up in 3 week(s).   Contact information: [address] [place] [address] [phone]              Contact information for after-discharge care     Destination     [facility] .    Service: Skilled Nursing Contact information: [place] [phone]                     Signed: Theodoro Doing Elmus Mathes 11/18/2022, 11:35 AM

## 2022-11-21 DIAGNOSIS — R52 Pain, unspecified: Secondary | ICD-10-CM | POA: Diagnosis not present

## 2022-11-21 DIAGNOSIS — R29898 Other symptoms and signs involving the musculoskeletal system: Secondary | ICD-10-CM | POA: Diagnosis not present

## 2022-11-21 DIAGNOSIS — M48061 Spinal stenosis, lumbar region without neurogenic claudication: Secondary | ICD-10-CM | POA: Diagnosis not present

## 2022-11-21 DIAGNOSIS — Z7189 Other specified counseling: Secondary | ICD-10-CM | POA: Diagnosis not present

## 2022-11-22 DIAGNOSIS — N1831 Chronic kidney disease, stage 3a: Secondary | ICD-10-CM | POA: Diagnosis not present

## 2022-11-22 DIAGNOSIS — D649 Anemia, unspecified: Secondary | ICD-10-CM | POA: Diagnosis not present

## 2022-11-22 DIAGNOSIS — R52 Pain, unspecified: Secondary | ICD-10-CM | POA: Diagnosis not present

## 2022-11-22 DIAGNOSIS — E782 Mixed hyperlipidemia: Secondary | ICD-10-CM | POA: Diagnosis not present

## 2022-11-22 DIAGNOSIS — K219 Gastro-esophageal reflux disease without esophagitis: Secondary | ICD-10-CM | POA: Diagnosis not present

## 2022-11-22 DIAGNOSIS — M48061 Spinal stenosis, lumbar region without neurogenic claudication: Secondary | ICD-10-CM | POA: Diagnosis not present

## 2022-11-22 MED FILL — Sodium Chloride IV Soln 0.9%: INTRAVENOUS | Qty: 1000 | Status: AC

## 2022-11-22 MED FILL — Heparin Sodium (Porcine) Inj 1000 Unit/ML: INTRAMUSCULAR | Qty: 30 | Status: AC

## 2022-11-23 DIAGNOSIS — M48061 Spinal stenosis, lumbar region without neurogenic claudication: Secondary | ICD-10-CM | POA: Diagnosis not present

## 2022-11-23 DIAGNOSIS — M1990 Unspecified osteoarthritis, unspecified site: Secondary | ICD-10-CM | POA: Diagnosis not present

## 2022-11-23 DIAGNOSIS — N1831 Chronic kidney disease, stage 3a: Secondary | ICD-10-CM | POA: Diagnosis not present

## 2022-11-23 DIAGNOSIS — K219 Gastro-esophageal reflux disease without esophagitis: Secondary | ICD-10-CM | POA: Diagnosis not present

## 2022-11-23 DIAGNOSIS — D649 Anemia, unspecified: Secondary | ICD-10-CM | POA: Diagnosis not present

## 2022-11-23 DIAGNOSIS — E782 Mixed hyperlipidemia: Secondary | ICD-10-CM | POA: Diagnosis not present

## 2022-11-24 DIAGNOSIS — N1831 Chronic kidney disease, stage 3a: Secondary | ICD-10-CM | POA: Diagnosis not present

## 2022-11-24 DIAGNOSIS — D649 Anemia, unspecified: Secondary | ICD-10-CM | POA: Diagnosis not present

## 2022-11-24 DIAGNOSIS — K219 Gastro-esophageal reflux disease without esophagitis: Secondary | ICD-10-CM | POA: Diagnosis not present

## 2022-11-24 DIAGNOSIS — R52 Pain, unspecified: Secondary | ICD-10-CM | POA: Diagnosis not present

## 2022-11-24 DIAGNOSIS — M48061 Spinal stenosis, lumbar region without neurogenic claudication: Secondary | ICD-10-CM | POA: Diagnosis not present

## 2022-11-24 DIAGNOSIS — R29898 Other symptoms and signs involving the musculoskeletal system: Secondary | ICD-10-CM | POA: Diagnosis not present

## 2022-11-24 DIAGNOSIS — Z7189 Other specified counseling: Secondary | ICD-10-CM | POA: Diagnosis not present

## 2022-11-24 DIAGNOSIS — E782 Mixed hyperlipidemia: Secondary | ICD-10-CM | POA: Diagnosis not present

## 2022-12-06 ENCOUNTER — Ambulatory Visit: Payer: Medicare HMO | Admitting: Family Medicine

## 2022-12-07 DIAGNOSIS — M419 Scoliosis, unspecified: Secondary | ICD-10-CM | POA: Diagnosis not present

## 2022-12-07 DIAGNOSIS — Z6830 Body mass index (BMI) 30.0-30.9, adult: Secondary | ICD-10-CM | POA: Diagnosis not present

## 2022-12-09 ENCOUNTER — Ambulatory Visit (INDEPENDENT_AMBULATORY_CARE_PROVIDER_SITE_OTHER): Payer: Medicare HMO | Admitting: Family Medicine

## 2022-12-09 ENCOUNTER — Encounter: Payer: Self-pay | Admitting: Family Medicine

## 2022-12-09 VITALS — BP 135/66 | HR 124 | Temp 98.8°F | Ht 62.0 in | Wt 167.0 lb

## 2022-12-09 DIAGNOSIS — I10 Essential (primary) hypertension: Secondary | ICD-10-CM | POA: Diagnosis not present
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
## 2022-12-10 LAB — BASIC METABOLIC PANEL
BUN/Creatinine Ratio: 9 (calc) (ref 6–22)
BUN: 14 mg/dL (ref 7–25)
CO2: 26 mmol/L (ref 20–32)
Calcium: 9.3 mg/dL (ref 8.6–10.4)
Chloride: 104 mmol/L (ref 98–110)
Creat: 1.52 mg/dL — ABNORMAL HIGH (ref 0.60–1.00)
Glucose, Bld: 117 mg/dL — ABNORMAL HIGH (ref 65–99)
Potassium: 4.2 mmol/L (ref 3.5–5.3)
Sodium: 142 mmol/L (ref 135–146)

## 2022-12-10 LAB — CBC WITH DIFFERENTIAL/PLATELET
Absolute Monocytes: 477 cells/uL (ref 200–950)
Basophils Absolute: 12 cells/uL (ref 0–200)
Basophils Relative: 0.2 %
Eosinophils Absolute: 242 cells/uL (ref 15–500)
Eosinophils Relative: 3.9 %
HCT: 33.9 % — ABNORMAL LOW (ref 35.0–45.0)
Hemoglobin: 10.9 g/dL — ABNORMAL LOW (ref 11.7–15.5)
Lymphs Abs: 2170 cells/uL (ref 850–3900)
MCH: 25.8 pg — ABNORMAL LOW (ref 27.0–33.0)
MCHC: 32.2 g/dL (ref 32.0–36.0)
MCV: 80.1 fL (ref 80.0–100.0)
MPV: 9.9 fL (ref 7.5–12.5)
Monocytes Relative: 7.7 %
Neutro Abs: 3298 cells/uL (ref 1500–7800)
Neutrophils Relative %: 53.2 %
Platelets: 212 10*3/uL (ref 140–400)
RBC: 4.23 10*6/uL (ref 3.80–5.10)
RDW: 14.9 % (ref 11.0–15.0)
Total Lymphocyte: 35 %
WBC: 6.2 10*3/uL (ref 3.8–10.8)

## 2022-12-10 LAB — IRON,TIBC AND FERRITIN PANEL
%SAT: 5 % (calc) — ABNORMAL LOW (ref 16–45)
Ferritin: 16 ng/mL (ref 16–288)
Iron: 17 ug/dL — ABNORMAL LOW (ref 45–160)
TIBC: 313 mcg/dL (calc) (ref 250–450)

## 2022-12-12 ENCOUNTER — Telehealth: Payer: Self-pay | Admitting: Family Medicine

## 2022-12-12 DIAGNOSIS — I129 Hypertensive chronic kidney disease with stage 1 through stage 4 chronic kidney disease, or unspecified chronic kidney disease: Secondary | ICD-10-CM | POA: Diagnosis not present

## 2022-12-12 DIAGNOSIS — M419 Scoliosis, unspecified: Secondary | ICD-10-CM | POA: Diagnosis not present

## 2022-12-12 DIAGNOSIS — N189 Chronic kidney disease, unspecified: Secondary | ICD-10-CM | POA: Diagnosis not present

## 2022-12-12 DIAGNOSIS — Z4789 Encounter for other orthopedic aftercare: Secondary | ICD-10-CM | POA: Diagnosis not present

## 2022-12-12 DIAGNOSIS — E785 Hyperlipidemia, unspecified: Secondary | ICD-10-CM | POA: Diagnosis not present

## 2022-12-12 DIAGNOSIS — M4727 Other spondylosis with radiculopathy, lumbosacral region: Secondary | ICD-10-CM | POA: Diagnosis not present

## 2022-12-12 DIAGNOSIS — Z981 Arthrodesis status: Secondary | ICD-10-CM | POA: Diagnosis not present

## 2022-12-12 DIAGNOSIS — M48061 Spinal stenosis, lumbar region without neurogenic claudication: Secondary | ICD-10-CM | POA: Diagnosis not present

## 2022-12-12 DIAGNOSIS — K219 Gastro-esophageal reflux disease without esophagitis: Secondary | ICD-10-CM | POA: Diagnosis not present

## 2022-12-12 MED ORDER — POLYSACCHARIDE IRON COMPLEX 150 MG PO CAPS: 150.0000 mg | ORAL_CAPSULE | Freq: Every day | ORAL | 1 refills | Status: DC

## 2022-12-12 NOTE — Telephone Encounter (Signed)
Please advise on v/o 

## 2022-12-12 NOTE — Telephone Encounter (Signed)
Please call patient: Her kidney function is mildly decreased.  I would encourage her to make sure she is hydrating well. Her calcium levels are back to her normal. Her iron levels are significantly low at 17.  I do believe this is why she is feeling so fatigued. Her hemoglobin/blood cells are stable since her surgery a month ago, but not yet back to normal.  I have called in iron polysaccharide 150 mg daily for her to start immediately.  Follow-up in 10-12 weeks on iron deficiency.  Also make sure she is taking a stool softener with her iron supplement.  However, this iron supplement does not cause as much GI upset/constipation as most iron supplements.

## 2022-12-12 NOTE — Telephone Encounter (Signed)
Caller/Agency: Chrys Racer with [facility] Number: [phone] Requesting OT/PT/Skilled Nursing/Social OT Work/Speech Therapy: OT to evaluate  Frequency: 2 week 2, 1 week 1

## 2022-12-12 NOTE — Telephone Encounter (Signed)
Approved.  

## 2022-12-13 NOTE — Telephone Encounter (Signed)
LVM for pt to CB regarding v/o.

## 2022-12-13 NOTE — Telephone Encounter (Signed)
Spoke with pt regarding labs and instructions.   

## 2022-12-13 NOTE — Telephone Encounter (Signed)
Approved orders given to [facility]

## 2022-12-15 DIAGNOSIS — E785 Hyperlipidemia, unspecified: Secondary | ICD-10-CM | POA: Diagnosis not present

## 2022-12-15 DIAGNOSIS — Z981 Arthrodesis status: Secondary | ICD-10-CM | POA: Diagnosis not present

## 2022-12-15 DIAGNOSIS — Z4789 Encounter for other orthopedic aftercare: Secondary | ICD-10-CM | POA: Diagnosis not present

## 2022-12-15 DIAGNOSIS — N189 Chronic kidney disease, unspecified: Secondary | ICD-10-CM | POA: Diagnosis not present

## 2022-12-15 DIAGNOSIS — K219 Gastro-esophageal reflux disease without esophagitis: Secondary | ICD-10-CM | POA: Diagnosis not present

## 2022-12-15 DIAGNOSIS — I129 Hypertensive chronic kidney disease with stage 1 through stage 4 chronic kidney disease, or unspecified chronic kidney disease: Secondary | ICD-10-CM | POA: Diagnosis not present

## 2022-12-15 DIAGNOSIS — M419 Scoliosis, unspecified: Secondary | ICD-10-CM | POA: Diagnosis not present

## 2022-12-15 DIAGNOSIS — M48061 Spinal stenosis, lumbar region without neurogenic claudication: Secondary | ICD-10-CM | POA: Diagnosis not present

## 2022-12-15 DIAGNOSIS — M4727 Other spondylosis with radiculopathy, lumbosacral region: Secondary | ICD-10-CM | POA: Diagnosis not present

## 2022-12-16 ENCOUNTER — Telehealth: Payer: Self-pay

## 2022-12-16 NOTE — Telephone Encounter (Signed)
Home health orders received 12/16/22 for [place] health initiation orders: Yes.  Home health re-certification orders: No. Patient last seen by ordering physician for this condition: 12/09/22. Must be less than 90 days for re-certification and less than 30 days prior for initiation. Visit must have been for the condition the orders are being placed.  Patient meets criteria for Physician to sign orders: Yes.        Current med list has been attached: Yes        Orders placed on physicians desk for signature: 12/16/22 (date) If patient does not meet criteria for orders to be signed: pt was called to schedule appt. Appt is scheduled for n/a.   Beatrix Fetters

## 2022-12-16 NOTE — Telephone Encounter (Signed)
faxed

## 2022-12-16 NOTE — Telephone Encounter (Signed)
Completed and returned to [facility] work basket ?

## 2022-12-16 NOTE — Telephone Encounter (Signed)
A user error has taken place: encounter opened in error, closed for administrative reasons.

## 2022-12-21 DIAGNOSIS — M419 Scoliosis, unspecified: Secondary | ICD-10-CM | POA: Diagnosis not present

## 2022-12-21 DIAGNOSIS — Z4789 Encounter for other orthopedic aftercare: Secondary | ICD-10-CM | POA: Diagnosis not present

## 2022-12-21 DIAGNOSIS — I129 Hypertensive chronic kidney disease with stage 1 through stage 4 chronic kidney disease, or unspecified chronic kidney disease: Secondary | ICD-10-CM | POA: Diagnosis not present

## 2022-12-21 DIAGNOSIS — E785 Hyperlipidemia, unspecified: Secondary | ICD-10-CM | POA: Diagnosis not present

## 2022-12-21 DIAGNOSIS — M48061 Spinal stenosis, lumbar region without neurogenic claudication: Secondary | ICD-10-CM | POA: Diagnosis not present

## 2022-12-21 DIAGNOSIS — M4727 Other spondylosis with radiculopathy, lumbosacral region: Secondary | ICD-10-CM | POA: Diagnosis not present

## 2022-12-21 DIAGNOSIS — N189 Chronic kidney disease, unspecified: Secondary | ICD-10-CM | POA: Diagnosis not present

## 2022-12-21 DIAGNOSIS — K219 Gastro-esophageal reflux disease without esophagitis: Secondary | ICD-10-CM | POA: Diagnosis not present

## 2022-12-21 DIAGNOSIS — Z981 Arthrodesis status: Secondary | ICD-10-CM | POA: Diagnosis not present

## 2022-12-23 DIAGNOSIS — E785 Hyperlipidemia, unspecified: Secondary | ICD-10-CM | POA: Diagnosis not present

## 2022-12-23 DIAGNOSIS — M4727 Other spondylosis with radiculopathy, lumbosacral region: Secondary | ICD-10-CM | POA: Diagnosis not present

## 2022-12-23 DIAGNOSIS — M419 Scoliosis, unspecified: Secondary | ICD-10-CM | POA: Diagnosis not present

## 2022-12-23 DIAGNOSIS — Z4789 Encounter for other orthopedic aftercare: Secondary | ICD-10-CM | POA: Diagnosis not present

## 2022-12-23 DIAGNOSIS — I129 Hypertensive chronic kidney disease with stage 1 through stage 4 chronic kidney disease, or unspecified chronic kidney disease: Secondary | ICD-10-CM | POA: Diagnosis not present

## 2022-12-23 DIAGNOSIS — M48061 Spinal stenosis, lumbar region without neurogenic claudication: Secondary | ICD-10-CM | POA: Diagnosis not present

## 2022-12-23 DIAGNOSIS — N189 Chronic kidney disease, unspecified: Secondary | ICD-10-CM | POA: Diagnosis not present

## 2022-12-23 DIAGNOSIS — Z981 Arthrodesis status: Secondary | ICD-10-CM | POA: Diagnosis not present

## 2022-12-23 DIAGNOSIS — K219 Gastro-esophageal reflux disease without esophagitis: Secondary | ICD-10-CM | POA: Diagnosis not present

## 2022-12-27 DIAGNOSIS — I129 Hypertensive chronic kidney disease with stage 1 through stage 4 chronic kidney disease, or unspecified chronic kidney disease: Secondary | ICD-10-CM | POA: Diagnosis not present

## 2022-12-27 DIAGNOSIS — E271 Primary adrenocortical insufficiency: Secondary | ICD-10-CM | POA: Diagnosis not present

## 2022-12-27 DIAGNOSIS — D508 Other iron deficiency anemias: Secondary | ICD-10-CM | POA: Diagnosis not present

## 2022-12-27 DIAGNOSIS — N390 Urinary tract infection, site not specified: Secondary | ICD-10-CM | POA: Diagnosis not present

## 2022-12-27 DIAGNOSIS — N1832 Chronic kidney disease, stage 3b: Secondary | ICD-10-CM | POA: Diagnosis not present

## 2022-12-27 DIAGNOSIS — D638 Anemia in other chronic diseases classified elsewhere: Secondary | ICD-10-CM | POA: Diagnosis not present

## 2022-12-28 DIAGNOSIS — M4727 Other spondylosis with radiculopathy, lumbosacral region: Secondary | ICD-10-CM | POA: Diagnosis not present

## 2022-12-28 DIAGNOSIS — I129 Hypertensive chronic kidney disease with stage 1 through stage 4 chronic kidney disease, or unspecified chronic kidney disease: Secondary | ICD-10-CM | POA: Diagnosis not present

## 2022-12-28 DIAGNOSIS — K219 Gastro-esophageal reflux disease without esophagitis: Secondary | ICD-10-CM | POA: Diagnosis not present

## 2022-12-28 DIAGNOSIS — N189 Chronic kidney disease, unspecified: Secondary | ICD-10-CM | POA: Diagnosis not present

## 2022-12-28 DIAGNOSIS — Z4789 Encounter for other orthopedic aftercare: Secondary | ICD-10-CM | POA: Diagnosis not present

## 2022-12-28 DIAGNOSIS — M48061 Spinal stenosis, lumbar region without neurogenic claudication: Secondary | ICD-10-CM | POA: Diagnosis not present

## 2022-12-28 DIAGNOSIS — E785 Hyperlipidemia, unspecified: Secondary | ICD-10-CM | POA: Diagnosis not present

## 2022-12-28 DIAGNOSIS — Z981 Arthrodesis status: Secondary | ICD-10-CM | POA: Diagnosis not present

## 2022-12-28 DIAGNOSIS — M419 Scoliosis, unspecified: Secondary | ICD-10-CM | POA: Diagnosis not present

## 2023-01-03 ENCOUNTER — Other Ambulatory Visit (HOSPITAL_COMMUNITY): Payer: Self-pay | Admitting: Family Medicine

## 2023-01-03 DIAGNOSIS — Z1231 Encounter for screening mammogram for malignant neoplasm of breast: Secondary | ICD-10-CM

## 2023-01-16 ENCOUNTER — Ambulatory Visit (HOSPITAL_COMMUNITY)
Admission: RE | Admit: 2023-01-16 | Discharge: 2023-01-16 | Disposition: A | Discharge status: Home / Self Care | Payer: Medicare HMO | Source: Ambulatory Visit | Attending: Family Medicine | Admitting: Family Medicine

## 2023-01-16 DIAGNOSIS — Z1231 Encounter for screening mammogram for malignant neoplasm of breast: Secondary | ICD-10-CM

## 2023-01-18 DIAGNOSIS — Z6831 Body mass index (BMI) 31.0-31.9, adult: Secondary | ICD-10-CM | POA: Diagnosis not present

## 2023-01-18 DIAGNOSIS — M419 Scoliosis, unspecified: Secondary | ICD-10-CM | POA: Diagnosis not present

## 2023-01-18 LAB — HM MAMMOGRAPHY

## 2023-01-19 ENCOUNTER — Telehealth: Payer: Self-pay

## 2023-01-19 DIAGNOSIS — R928 Other abnormal and inconclusive findings on diagnostic imaging of breast: Secondary | ICD-10-CM

## 2023-01-19 NOTE — Telephone Encounter (Signed)
Received Mammogram results from [facility] on 01/19/23. Will place on PCP desk for review.   Additional imaging order requested for signature

## 2023-01-20 NOTE — Telephone Encounter (Signed)
faxed

## 2023-01-20 NOTE — Telephone Encounter (Signed)
Received and signed. Returned to [place] work Copy

## 2023-01-23 ENCOUNTER — Other Ambulatory Visit (HOSPITAL_COMMUNITY): Payer: Self-pay | Admitting: Family Medicine

## 2023-01-23 DIAGNOSIS — R928 Other abnormal and inconclusive findings on diagnostic imaging of breast: Secondary | ICD-10-CM

## 2023-02-06 ENCOUNTER — Other Ambulatory Visit: Payer: Self-pay | Admitting: Family Medicine

## 2023-02-07 ENCOUNTER — Encounter (HOSPITAL_COMMUNITY): Payer: Self-pay

## 2023-02-07 ENCOUNTER — Ambulatory Visit (HOSPITAL_COMMUNITY)
Admission: RE | Admit: 2023-02-07 | Discharge: 2023-02-07 | Disposition: A | Discharge status: Home / Self Care | Payer: Medicare HMO | Source: Ambulatory Visit | Attending: Family Medicine | Admitting: Family Medicine

## 2023-02-07 DIAGNOSIS — R928 Other abnormal and inconclusive findings on diagnostic imaging of breast: Secondary | ICD-10-CM | POA: Diagnosis not present

## 2023-02-07 DIAGNOSIS — N6489 Other specified disorders of breast: Secondary | ICD-10-CM | POA: Diagnosis not present

## 2023-03-17 ENCOUNTER — Encounter: Payer: Self-pay | Admitting: Family Medicine

## 2023-03-17 ENCOUNTER — Ambulatory Visit (INDEPENDENT_AMBULATORY_CARE_PROVIDER_SITE_OTHER): Payer: Medicare HMO | Admitting: Family Medicine

## 2023-03-17 VITALS — BP 108/73 | HR 92 | Temp 97.8°F | Wt 173.4 lb

## 2023-03-17 DIAGNOSIS — R5383 Other fatigue: Secondary | ICD-10-CM

## 2023-03-17 DIAGNOSIS — D509 Iron deficiency anemia, unspecified: Secondary | ICD-10-CM
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
## 2023-03-18 LAB — IRON,TIBC AND FERRITIN PANEL
%SAT: 33 % (calc) (ref 16–45)
Ferritin: 12 ng/mL — ABNORMAL LOW (ref 16–288)
Iron: 98 ug/dL (ref 45–160)
TIBC: 294 mcg/dL (calc) (ref 250–450)

## 2023-03-20 ENCOUNTER — Other Ambulatory Visit: Payer: Self-pay | Admitting: Family Medicine

## 2023-03-20 ENCOUNTER — Other Ambulatory Visit: Payer: Self-pay

## 2023-03-20 MED ORDER — SIMVASTATIN 20 MG PO TABS: 20.0000 mg | ORAL_TABLET | Freq: Every day | ORAL | 3 refills | Status: DC

## 2023-03-20 MED ORDER — CYCLOBENZAPRINE HCL 5 MG PO TABS: ORAL_TABLET | ORAL | 0 refills | Status: DC

## 2023-03-20 MED ORDER — POLYSACCHARIDE IRON COMPLEX 150 MG PO CAPS: 150.0000 mg | ORAL_CAPSULE | ORAL | 1 refills | Status: DC

## 2023-03-20 MED ORDER — GABAPENTIN 300 MG PO CAPS: 300.0000 mg | ORAL_CAPSULE | Freq: Three times a day (TID) | ORAL | 1 refills | Status: DC

## 2023-03-20 NOTE — Telephone Encounter (Signed)
Patient was seen on Friday 4/12 by Dr. Claiborne Billings and forgot to tell her about 3 medications she needed refills on. Please send to [facility]  cyclobenzaprine (FLEXERIL) 5 MG tablet  simvastatin (ZOCOR) 20 MG tablet   gabapentin (NEURONTIN) 300 MG capsule

## 2023-03-20 NOTE — Telephone Encounter (Signed)
Please fill, if appropriate. Meds pending 

## 2023-03-28 DIAGNOSIS — D638 Anemia in other chronic diseases classified elsewhere: Secondary | ICD-10-CM | POA: Diagnosis not present

## 2023-03-28 DIAGNOSIS — N1832 Chronic kidney disease, stage 3b: Secondary | ICD-10-CM | POA: Diagnosis not present

## 2023-03-28 DIAGNOSIS — E271 Primary adrenocortical insufficiency: Secondary | ICD-10-CM | POA: Diagnosis not present

## 2023-03-28 DIAGNOSIS — D508 Other iron deficiency anemias: Secondary | ICD-10-CM | POA: Diagnosis not present

## 2023-03-28 DIAGNOSIS — I129 Hypertensive chronic kidney disease with stage 1 through stage 4 chronic kidney disease, or unspecified chronic kidney disease: Secondary | ICD-10-CM | POA: Diagnosis not present

## 2023-04-04 DIAGNOSIS — N1831 Chronic kidney disease, stage 3a: Secondary | ICD-10-CM | POA: Diagnosis not present

## 2023-04-04 DIAGNOSIS — I129 Hypertensive chronic kidney disease with stage 1 through stage 4 chronic kidney disease, or unspecified chronic kidney disease: Secondary | ICD-10-CM | POA: Diagnosis not present

## 2023-04-11 ENCOUNTER — Telehealth: Payer: Self-pay | Admitting: Family Medicine

## 2023-04-11 NOTE — Telephone Encounter (Signed)
Contacted MAKYNLEY RAYNOLDS to schedule their annual wellness visit. Appointment made for 04/26/2023.  Gabriel Cirri [facility] AW[facility] TEAM Direct Dial [phone]

## 2023-04-17 ENCOUNTER — Telehealth: Payer: Self-pay | Admitting: "Endocrinology

## 2023-04-17 ENCOUNTER — Other Ambulatory Visit: Payer: Self-pay

## 2023-04-17 DIAGNOSIS — E2749 Other adrenocortical insufficiency: Secondary | ICD-10-CM

## 2023-04-17 NOTE — Telephone Encounter (Signed)
Labs updated and sent to [facility]. 

## 2023-04-17 NOTE — Telephone Encounter (Signed)
Can someone update these labs?   

## 2023-04-26 ENCOUNTER — Ambulatory Visit (INDEPENDENT_AMBULATORY_CARE_PROVIDER_SITE_OTHER): Payer: Medicare HMO

## 2023-04-26 VITALS — Wt 173.0 lb

## 2023-04-26 DIAGNOSIS — Z0000 Encounter for general adult medical examination without abnormal findings: Secondary | ICD-10-CM
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
## 2023-04-26 NOTE — Patient Instructions (Signed)
Kathryn Lynn , Thank you for taking time to come for your Medicare Wellness Visit. I appreciate your ongoing commitment to your health goals. Please review the following plan we discussed and let me know if I can assist you in the future.   These are the goals we discussed:  Goals      Patient Stated     Eat healthier     Patient Stated     Lose weight      Patient Stated     Lose weight         This is a list of the screening recommended for you and due dates:  Health Maintenance  Topic Date Due   Flu Shot  07/06/2023   Medicare Annual Wellness Visit  04/25/2024   DEXA scan (bone density measurement)  08/17/2024   Mammogram  01/18/2025   DTaP/Tdap/Td vaccine (2 - Td or Tdap) 05/09/2031   Colon Cancer Screening  07/14/2031   Pneumonia Vaccine  Completed   Hepatitis C Screening: USPSTF Recommendation to screen - Ages 45-79 yo.  Completed   Zoster (Shingles) Vaccine  Completed   HPV Vaccine  Aged Out   COVID-19 Vaccine  Discontinued   Cologuard (Stool DNA test)  Discontinued    Advanced directives: Please bring a copy of your health care power of attorney and living will to the office at your convenience.  Conditions/risks identified: lose weight   Next appointment: Follow up in one year for your annual wellness visit    Preventive Care 65 Years and Older, Female Preventive care refers to lifestyle choices and visits with your health care provider that can promote health and wellness. What does preventive care include? A yearly physical exam. This is also called an annual well check. Dental exams once or twice a year. Routine eye exams. Ask your health care provider how often you should have your eyes checked. Personal lifestyle choices, including: Daily care of your teeth and gums. Regular physical activity. Eating a healthy diet. Avoiding tobacco and drug use. Limiting alcohol use. Practicing safe sex. Taking low-dose aspirin every day. Taking vitamin and mineral  supplements as recommended by your health care provider. What happens during an annual well check? The services and screenings done by your health care provider during your annual well check will depend on your age, overall health, lifestyle risk factors, and family history of disease. Counseling  Your health care provider may ask you questions about your: Alcohol use. Tobacco use. Drug use. Emotional well-being. Home and relationship well-being. Sexual activity. Eating habits. History of falls. Memory and ability to understand (cognition). Work and work Astronomer. Reproductive health. Screening  You may have the following tests or measurements: Height, weight, and BMI. Blood pressure. Lipid and cholesterol levels. These may be checked every 5 years, or more frequently if you are over 18 years old. Skin check. Lung cancer screening. You may have this screening every year starting at age 59 if you have a 30-pack-year history of smoking and currently smoke or have quit within the past 15 years. Fecal occult blood test (FOBT) of the stool. You may have this test every year starting at age 50. Flexible sigmoidoscopy or colonoscopy. You may have a sigmoidoscopy every 5 years or a colonoscopy every 10 years starting at age 47. Hepatitis C blood test. Hepatitis B blood test. Sexually transmitted disease (STD) testing. Diabetes screening. This is done by checking your blood sugar (glucose) after you have not eaten for a while (fasting). You  may have this done every 1-3 years. Bone density scan. This is done to screen for osteoporosis. You may have this done starting at age 18. Mammogram. This may be done every 1-2 years. Talk to your health care provider about how often you should have regular mammograms. Talk with your health care provider about your test results, treatment options, and if necessary, the need for more tests. Vaccines  Your health care provider may recommend certain  vaccines, such as: Influenza vaccine. This is recommended every year. Tetanus, diphtheria, and acellular pertussis (Tdap, Td) vaccine. You may need a Td booster every 10 years. Zoster vaccine. You may need this after age 67. Pneumococcal 13-valent conjugate (PCV13) vaccine. One dose is recommended after age 41. Pneumococcal polysaccharide (PPSV23) vaccine. One dose is recommended after age 42. Talk to your health care provider about which screenings and vaccines you need and how often you need them. This information is not intended to replace advice given to you by your health care provider. Make sure you discuss any questions you have with your health care provider. Document Released: 12/18/2015 Document Revised: 08/10/2016 Document Reviewed: 09/22/2015 Elsevier Interactive Patient Education  2017 [employer].  Fall Prevention in the Home Falls can cause injuries. They can happen to people of all ages. There are many things you can do to make your home safe and to help prevent falls. What can I do on the outside of my home? Regularly fix the edges of walkways and driveways and fix any cracks. Remove anything that might make you trip as you walk through a door, such as a raised step or threshold. Trim any bushes or trees on the path to your home. Use bright outdoor lighting. Clear any walking paths of anything that might make someone trip, such as rocks or tools. Regularly check to see if handrails are loose or broken. Make sure that both sides of any steps have handrails. Any raised decks and porches should have guardrails on the edges. Have any leaves, snow, or ice cleared regularly. Use sand or salt on walking paths during winter. Clean up any spills in your garage right away. This includes oil or grease spills. What can I do in the bathroom? Use night lights. Install grab bars by the toilet and in the tub and shower. Do not use towel bars as grab bars. Use non-skid mats or decals in  the tub or shower. If you need to sit down in the shower, use a plastic, non-slip stool. Keep the floor dry. Clean up any water that spills on the floor as soon as it happens. Remove soap buildup in the tub or shower regularly. Attach bath mats securely with double-sided non-slip rug tape. Do not have throw rugs and other things on the floor that can make you trip. What can I do in the bedroom? Use night lights. Make sure that you have a light by your bed that is easy to reach. Do not use any sheets or blankets that are too big for your bed. They should not hang down onto the floor. Have a firm chair that has side arms. You can use this for support while you get dressed. Do not have throw rugs and other things on the floor that can make you trip. What can I do in the kitchen? Clean up any spills right away. Avoid walking on wet floors. Keep items that you use a lot in easy-to-reach places. If you need to reach something above you, use a strong  step stool that has a grab bar. Keep electrical cords out of the way. Do not use floor polish or wax that makes floors slippery. If you must use wax, use non-skid floor wax. Do not have throw rugs and other things on the floor that can make you trip. What can I do with my stairs? Do not leave any items on the stairs. Make sure that there are handrails on both sides of the stairs and use them. Fix handrails that are broken or loose. Make sure that handrails are as long as the stairways. Check any carpeting to make sure that it is firmly attached to the stairs. Fix any carpet that is loose or worn. Avoid having throw rugs at the top or bottom of the stairs. If you do have throw rugs, attach them to the floor with carpet tape. Make sure that you have a light switch at the top of the stairs and the bottom of the stairs. If you do not have them, ask someone to add them for you. What else can I do to help prevent falls? Wear shoes that: Do not have high  heels. Have rubber bottoms. Are comfortable and fit you well. Are closed at the toe. Do not wear sandals. If you use a stepladder: Make sure that it is fully opened. Do not climb a closed stepladder. Make sure that both sides of the stepladder are locked into place. Ask someone to hold it for you, if possible. Clearly mark and make sure that you can see: Any grab bars or handrails. First and last steps. Where the edge of each step is. Use tools that help you move around (mobility aids) if they are needed. These include: Canes. Walkers. Scooters. Crutches. Turn on the lights when you go into a dark area. Replace any light bulbs as soon as they burn out. Set up your furniture so you have a clear path. Avoid moving your furniture around. If any of your floors are uneven, fix them. If there are any pets around you, be aware of where they are. Review your medicines with your doctor. Some medicines can make you feel dizzy. This can increase your chance of falling. Ask your doctor what other things that you can do to help prevent falls. This information is not intended to replace advice given to you by your health care provider. Make sure you discuss any questions you have with your health care provider. Document Released: 09/17/2009 Document Revised: 04/28/2016 Document Reviewed: 12/26/2014 Elsevier Interactive Patient Education  2017 [employer].

## 2023-04-28 ENCOUNTER — Ambulatory Visit: Payer: Medicare HMO | Admitting: "Endocrinology

## 2023-04-28 DIAGNOSIS — E2749 Other adrenocortical insufficiency: Secondary | ICD-10-CM | POA: Diagnosis not present

## 2023-05-02 LAB — CORTISOL-AM, BLOOD: Cortisol - AM: 11.4 ug/dL (ref 6.2–19.4)

## 2023-05-03 ENCOUNTER — Encounter: Payer: Self-pay | Admitting: "Endocrinology

## 2023-05-03 ENCOUNTER — Ambulatory Visit: Payer: Medicare HMO | Admitting: "Endocrinology

## 2023-05-03 VITALS — BP 102/64 | HR 88 | Ht 62.0 in | Wt 175.2 lb

## 2023-05-03 DIAGNOSIS — E2749 Other adrenocortical insufficiency: Secondary | ICD-10-CM
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
## 2023-05-17 DIAGNOSIS — M419 Scoliosis, unspecified: Secondary | ICD-10-CM | POA: Diagnosis not present

## 2023-05-30 DIAGNOSIS — H524 Presbyopia: Secondary | ICD-10-CM | POA: Diagnosis not present

## 2023-05-30 DIAGNOSIS — H52223 Regular astigmatism, bilateral: Secondary | ICD-10-CM | POA: Diagnosis not present

## 2023-05-30 DIAGNOSIS — D23121 Other benign neoplasm of skin of left upper eyelid, including canthus: Secondary | ICD-10-CM | POA: Diagnosis not present

## 2023-05-30 DIAGNOSIS — H5213 Myopia, bilateral: Secondary | ICD-10-CM | POA: Diagnosis not present

## 2023-05-30 DIAGNOSIS — H2513 Age-related nuclear cataract, bilateral: Secondary | ICD-10-CM | POA: Diagnosis not present

## 2023-05-30 DIAGNOSIS — D23112 Other benign neoplasm of skin of right lower eyelid, including canthus: Secondary | ICD-10-CM | POA: Diagnosis not present

## 2023-08-03 DIAGNOSIS — N1831 Chronic kidney disease, stage 3a: Secondary | ICD-10-CM | POA: Diagnosis not present

## 2023-08-03 DIAGNOSIS — Z79899 Other long term (current) drug therapy: Secondary | ICD-10-CM | POA: Diagnosis not present

## 2023-08-03 DIAGNOSIS — I129 Hypertensive chronic kidney disease with stage 1 through stage 4 chronic kidney disease, or unspecified chronic kidney disease: Secondary | ICD-10-CM | POA: Diagnosis not present

## 2023-08-10 DIAGNOSIS — I129 Hypertensive chronic kidney disease with stage 1 through stage 4 chronic kidney disease, or unspecified chronic kidney disease: Secondary | ICD-10-CM | POA: Diagnosis not present

## 2023-08-10 DIAGNOSIS — K760 Fatty (change of) liver, not elsewhere classified: Secondary | ICD-10-CM | POA: Diagnosis not present

## 2023-08-10 DIAGNOSIS — N1832 Chronic kidney disease, stage 3b: Secondary | ICD-10-CM | POA: Diagnosis not present

## 2023-08-10 DIAGNOSIS — E2740 Unspecified adrenocortical insufficiency: Secondary | ICD-10-CM | POA: Diagnosis not present

## 2023-08-18 ENCOUNTER — Ambulatory Visit (INDEPENDENT_AMBULATORY_CARE_PROVIDER_SITE_OTHER): Payer: Medicare HMO | Admitting: Family Medicine

## 2023-08-18 VITALS — BP 104/64 | HR 86 | Temp 98.6°F | Ht 62.0 in | Wt 182.1 lb

## 2023-08-18 DIAGNOSIS — J069 Acute upper respiratory infection, unspecified: Secondary | ICD-10-CM

## 2023-08-18 LAB — POC COVID19 BINAXNOW: SARS Coronavirus 2 Ag: NEGATIVE

## 2023-08-18 NOTE — Progress Notes (Signed)
Acute Office Visit   Subjective:  Patient ID: Kathryn Lynn, female    DOB: 1950-12-11, 72 y.o.   MRN: [id number]  Chief Complaint  Patient presents with   Sinus Problem    Pt reports sinus pressure for awhile. Sx today ear pressure, chest tight, cough, itching eye, itching throat. Pt reports she has not covid tested OTC Flonase and allergie med    HPI Patient is a 72 year old female that presents with the following symptoms: Sinus pressure Ear pressure Chest tightness-respiratory  Cough  Itchy eye  Itching throat  Headache Fatigue   Denies chest pain or shortness of breath; fever, nausea, or vomiting. .   Symptoms started yesterday.   She has been taking Flonase and Zyrtec. Not helping as much.   ROS See HPI above     Objective:   BP 104/64   Pulse 86   Temp 98.6 F (37 C)   Ht 5\' 2"  (1.575 m)   Wt 182 lb 2 oz (82.6 kg)   SpO2 99%   BMI 33.31 kg/m    Physical Exam Vitals reviewed.  Constitutional:      General: She is not in acute distress.    Appearance: Normal appearance. She is not ill-appearing, toxic-appearing or diaphoretic.  HENT:     Head: Normocephalic and atraumatic.     Right Ear: Tympanic membrane, ear canal and external ear normal. There is no impacted cerumen.     Left Ear: Tympanic membrane, ear canal and external ear normal. There is no impacted cerumen.     Nose:     Right Sinus: No maxillary sinus tenderness or frontal sinus tenderness.     Left Sinus: Maxillary sinus tenderness present. No frontal sinus tenderness.     Mouth/Throat:     Pharynx: Oropharynx is clear. Uvula midline. No pharyngeal swelling, oropharyngeal exudate, posterior oropharyngeal erythema, uvula swelling or postnasal drip.  Eyes:     General:        Right eye: No discharge.        Left eye: No discharge.     Conjunctiva/sclera: Conjunctivae normal.  Cardiovascular:     Rate and Rhythm: Normal rate and regular rhythm.     Heart sounds: Normal heart sounds. No  murmur heard.    No friction rub. No gallop.  Pulmonary:     Effort: Pulmonary effort is normal. No respiratory distress.     Breath sounds: Normal breath sounds.  Musculoskeletal:        General: Normal range of motion.  Skin:    General: Skin is warm and dry.  Neurological:     General: No focal deficit present.     Mental Status: She is alert and oriented to person, place, and time. Mental status is at baseline.  Psychiatric:        Mood and Affect: Mood normal.        Behavior: Behavior normal.        Thought Content: Thought content normal.        Judgment: Judgment normal.     Results for orders placed or performed in visit on 08/18/23  POC COVID-19  Result Value Ref Range   SARS Coronavirus 2 Ag Negative Negative      Assessment & Plan:  Viral URI with cough -     POC COVID-19 BinaxNow  -Rapid covid is negative.  -Viral upper respiratory infection.  -Recommend support care.  -Alternate Tylenol 1000mg  and Ibuprofen 600-800mg  every 4 hours  for pain, headache, and body aches. Recommend to eat something when taking Ibuprofen.   -May use over the counter Mucinex. -Rest, hydrate.  -If symptoms do not improve, follow up with primary care.   Zandra Abts, NP

## 2023-08-18 NOTE — Patient Instructions (Addendum)
-  Rapid covid is negative.  -Viral upper respiratory infection.  -Recommend support care.  -Alternate Tylenol 1000mg  and Ibuprofen 600-800mg  every 4 hours for pain, headache, and body aches. Recommend to eat something when taking Ibuprofen.   -May use over the counter Mucinex. -Rest, hydrate.  -If symptoms do not improve, follow up with primary care.

## 2023-09-12 ENCOUNTER — Encounter: Payer: Self-pay | Admitting: Family Medicine

## 2023-09-12 ENCOUNTER — Ambulatory Visit (INDEPENDENT_AMBULATORY_CARE_PROVIDER_SITE_OTHER): Payer: Medicare HMO | Admitting: Family Medicine

## 2023-09-12 VITALS — BP 112/72 | HR 67 | Temp 97.7°F | Wt 181.2 lb

## 2023-09-12 DIAGNOSIS — Z23 Encounter for immunization: Secondary | ICD-10-CM

## 2023-09-12 DIAGNOSIS — D509 Iron deficiency anemia, unspecified: Secondary | ICD-10-CM

## 2023-09-12 DIAGNOSIS — E782 Mixed hyperlipidemia: Secondary | ICD-10-CM | POA: Diagnosis not present

## 2023-09-12 DIAGNOSIS — I10 Essential (primary) hypertension: Secondary | ICD-10-CM

## 2023-09-12 DIAGNOSIS — M8588 Other specified disorders of bone density and structure, other site: Secondary | ICD-10-CM | POA: Diagnosis not present

## 2023-09-12 DIAGNOSIS — R7309 Other abnormal glucose: Secondary | ICD-10-CM

## 2023-09-12 LAB — CBC WITH DIFFERENTIAL/PLATELET
Basophils Absolute: 0 10*3/uL (ref 0.0–0.1)
Basophils Relative: 0.5 % (ref 0.0–3.0)
Eosinophils Absolute: 0.2 10*3/uL (ref 0.0–0.7)
Eosinophils Relative: 4.4 % (ref 0.0–5.0)
HCT: 41.6 % (ref 36.0–46.0)
Hemoglobin: 13.1 g/dL (ref 12.0–15.0)
Lymphocytes Relative: 42.3 % (ref 12.0–46.0)
Lymphs Abs: 2.3 10*3/uL (ref 0.7–4.0)
MCHC: 31.5 g/dL (ref 30.0–36.0)
MCV: 87.2 fL (ref 78.0–100.0)
Monocytes Absolute: 0.6 10*3/uL (ref 0.1–1.0)
Monocytes Relative: 10.3 % (ref 3.0–12.0)
Neutro Abs: 2.3 10*3/uL (ref 1.4–7.7)
Neutrophils Relative %: 42.5 % — ABNORMAL LOW (ref 43.0–77.0)
Platelets: 227 10*3/uL (ref 150.0–400.0)
RBC: 4.77 Mil/uL (ref 3.87–5.11)
RDW: 15.3 % (ref 11.5–15.5)
WBC: 5.3 10*3/uL (ref 4.0–10.5)

## 2023-09-12 LAB — COMPREHENSIVE METABOLIC PANEL
ALT: 13 U/L (ref 0–35)
AST: 19 U/L (ref 0–37)
Albumin: 4.3 g/dL (ref 3.5–5.2)
Alkaline Phosphatase: 59 U/L (ref 39–117)
BUN: 14 mg/dL (ref 6–23)
CO2: 30 meq/L (ref 19–32)
Calcium: 9.5 mg/dL (ref 8.4–10.5)
Chloride: 102 meq/L (ref 96–112)
Creatinine, Ser: 1.47 mg/dL — ABNORMAL HIGH (ref 0.40–1.20)
GFR: 35.52 mL/min — ABNORMAL LOW (ref >60.00)
Glucose, Bld: 79 mg/dL (ref 70–99)
Potassium: 4.6 meq/L (ref 3.5–5.1)
Sodium: 141 meq/L (ref 135–145)
Total Bilirubin: 0.8 mg/dL (ref 0.2–1.2)
Total Protein: 6.5 g/dL (ref 6.0–8.3)

## 2023-09-12 LAB — IBC + FERRITIN
Ferritin: 11.2 ng/mL (ref 10.0–291.0)
Iron: 57 ug/dL (ref 42–145)
Saturation Ratios: 17.2 % — ABNORMAL LOW (ref 20.0–50.0)
TIBC: 331.8 ug/dL (ref 250.0–450.0)
Transferrin: 237 mg/dL (ref 212.0–360.0)

## 2023-09-12 LAB — LIPID PANEL
Cholesterol: 208 mg/dL — ABNORMAL HIGH (ref 0–200)
HDL: 64.9 mg/dL (ref >39.00)
LDL Cholesterol: 102 mg/dL — ABNORMAL HIGH (ref 0–99)
NonHDL: 142.8
Total CHOL/HDL Ratio: 3
Triglycerides: 203 mg/dL — ABNORMAL HIGH (ref 0.0–149.0)
VLDL: 40.6 mg/dL — ABNORMAL HIGH (ref 0.0–40.0)

## 2023-09-12 LAB — HEMOGLOBIN A1C: Hgb A1c MFr Bld: 5.9 % (ref 4.6–6.5)

## 2023-09-12 LAB — TSH: TSH: 1.73 u[IU]/mL (ref 0.35–5.50)

## 2023-09-12 MED ORDER — SIMVASTATIN 20 MG PO TABS: 20.0000 mg | ORAL_TABLET | Freq: Every day | ORAL | 3 refills | Status: DC

## 2023-09-12 MED ORDER — CYCLOBENZAPRINE HCL 5 MG PO TABS: ORAL_TABLET | ORAL | 5 refills | Status: DC

## 2023-09-12 MED ORDER — GABAPENTIN 300 MG PO CAPS: 300.0000 mg | ORAL_CAPSULE | Freq: Three times a day (TID) | ORAL | 1 refills | Status: DC

## 2023-09-12 MED ORDER — DICLOFENAC SODIUM 1 % EX GEL: 2.0000 g | Freq: Four times a day (QID) | CUTANEOUS | 3 refills | Status: DC
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
## 2023-09-12 NOTE — Patient Instructions (Addendum)
Return in about 24 weeks (around 02/27/2024) for cpe (20 min), Routine chronic condition follow-up.        Great to see you today.  I have refilled the medication(s) we provide.   If labs were collected or images ordered, we will inform you of  results once we have received them and reviewed. We will contact you either by echart message, or telephone call.  Please give ample time to the testing facility, and our office to run,  receive and review results. Please do not call inquiring of results, even if you can see them in your chart. We will contact you as soon as we are able. If it has been over 1 week since the test was completed, and you have not yet heard from [place], then please call us.    - echart message- for normal results that have been seen by the patient already.   - telephone call: abnormal results or if patient has not viewed results in their echart.  If a referral to a specialist was entered for you, please call us in 2 weeks if you have not heard from the specialist office to schedule.

## 2023-09-13 ENCOUNTER — Telehealth: Payer: Self-pay | Admitting: Family Medicine

## 2023-09-13 MED ORDER — POLYSACCHARIDE IRON COMPLEX 150 MG PO CAPS: 150.0000 mg | ORAL_CAPSULE | ORAL | 1 refills | Status: DC

## 2023-09-13 NOTE — Telephone Encounter (Signed)
Please call patient Liver function and electrolytes are normal. Kidney function is stable. Iron panel is borderline, she does need to continue the iron supplement and I have refilled that for her. Blood cell counts look better than prior and she is no longer anemic. Diabetes/A1c screening is in normal range. Thyroid levels are functioning normal.  Cholesterol panel looks good with an LDL of 102 and a cholesterol ratio of 3 is great.

## 2023-11-17 ENCOUNTER — Encounter: Payer: Self-pay | Admitting: Family Medicine

## 2023-11-17 ENCOUNTER — Ambulatory Visit (INDEPENDENT_AMBULATORY_CARE_PROVIDER_SITE_OTHER): Payer: Medicare HMO | Admitting: Family Medicine

## 2023-11-17 VITALS — BP 128/70 | HR 111 | Temp 98.1°F | Wt 183.0 lb

## 2023-11-17 DIAGNOSIS — R0981 Nasal congestion: Secondary | ICD-10-CM | POA: Diagnosis not present

## 2023-11-17 LAB — POC COVID19 BINAXNOW: SARS Coronavirus 2 Ag: NEGATIVE

## 2023-11-17 MED ORDER — METHYLPREDNISOLONE ACETATE 80 MG/ML IJ SUSP
80.0000 mg | Freq: Once | INTRAMUSCULAR | Status: AC
Start: 2023-11-17 — End: 2023-11-17
  Administered 2023-11-17: 80 mg via INTRAMUSCULAR

## 2023-11-17 MED ORDER — DOXYCYCLINE HYCLATE 100 MG PO TABS: 100.0000 mg | ORAL_TABLET | Freq: Two times a day (BID) | ORAL | 0 refills | Status: AC

## 2023-11-17 NOTE — Patient Instructions (Addendum)
Return in about 2 weeks (around 12/01/2023), or if symptoms worsen or fail to improve.        Great to see you today.  I have refilled the medication(s) we provide.   If labs were collected or images ordered, we will inform you of  results once we have received them and reviewed. We will contact you either by echart message, or telephone call.  Please give ample time to the testing facility, and our office to run,  receive and review results. Please do not call inquiring of results, even if you can see them in your chart. We will contact you as soon as we are able. If it has been over 1 week since the test was completed, and you have not yet heard from [place], then please call us.    - echart message- for normal results that have been seen by the patient already.   - telephone call: abnormal results or if patient has not viewed results in their echart.  If a referral to a specialist was entered for you, please call us in 2 weeks if you have not heard from the specialist office to schedule.
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
## 2023-12-07 DIAGNOSIS — G4733 Obstructive sleep apnea (adult) (pediatric): Secondary | ICD-10-CM | POA: Diagnosis not present

## 2023-12-07 DIAGNOSIS — K760 Fatty (change of) liver, not elsewhere classified: Secondary | ICD-10-CM | POA: Diagnosis not present

## 2023-12-07 DIAGNOSIS — N189 Chronic kidney disease, unspecified: Secondary | ICD-10-CM | POA: Diagnosis not present

## 2023-12-07 DIAGNOSIS — N1832 Chronic kidney disease, stage 3b: Secondary | ICD-10-CM | POA: Diagnosis not present

## 2023-12-07 DIAGNOSIS — I129 Hypertensive chronic kidney disease with stage 1 through stage 4 chronic kidney disease, or unspecified chronic kidney disease: Secondary | ICD-10-CM | POA: Diagnosis not present

## 2023-12-14 DIAGNOSIS — K760 Fatty (change of) liver, not elsewhere classified: Secondary | ICD-10-CM | POA: Diagnosis not present

## 2023-12-14 DIAGNOSIS — N1832 Chronic kidney disease, stage 3b: Secondary | ICD-10-CM | POA: Diagnosis not present

## 2023-12-14 DIAGNOSIS — I129 Hypertensive chronic kidney disease with stage 1 through stage 4 chronic kidney disease, or unspecified chronic kidney disease: Secondary | ICD-10-CM | POA: Diagnosis not present

## 2023-12-14 DIAGNOSIS — G4733 Obstructive sleep apnea (adult) (pediatric): Secondary | ICD-10-CM | POA: Diagnosis not present

## 2023-12-21 ENCOUNTER — Ambulatory Visit: Payer: Medicare HMO | Admitting: Urgent Care

## 2023-12-21 ENCOUNTER — Encounter: Payer: Self-pay | Admitting: Urgent Care

## 2023-12-21 VITALS — BP 138/77 | HR 96 | Temp 98.1°F | Wt 190.8 lb

## 2023-12-21 DIAGNOSIS — H6593 Unspecified nonsuppurative otitis media, bilateral: Secondary | ICD-10-CM | POA: Diagnosis not present

## 2023-12-21 DIAGNOSIS — R0981 Nasal congestion: Secondary | ICD-10-CM

## 2023-12-21 MED ORDER — PREDNISONE 20 MG PO TABS
40.0000 mg | ORAL_TABLET | Freq: Every day | ORAL | 0 refills | Status: AC
Start: 2023-12-21 — End: 2023-12-26

## 2023-12-21 MED ORDER — DOXYCYCLINE HYCLATE 100 MG PO CAPS
100.0000 mg | ORAL_CAPSULE | Freq: Two times a day (BID) | ORAL | 0 refills | Status: AC
Start: 2023-12-21 — End: 2023-12-31

## 2023-12-21 NOTE — Patient Instructions (Signed)
You have a sinus infection.  Please start taking the antibiotic, doxycycline, twice daily. Best taken without food, however if this causes upset stomach or nausea, you may take it with a meal that does not contain dairy. Take it for all 10 days, do not stop early just because you feel better.   Start prednisone 40mg  (two tabs) once daily with breakfast.  Use Flonase daily to help with inflammation of the nasal passage. It is also recommended that you use nasal saline/ sinus washes to cleans the sinus passages. Look for the NEIL MED SINUGATOR and flush your nasal passages out 1-2x/ day. Use the pre-mixed packets and distilled water in the device.  Hot steam from a shower or vaporizer may also be beneficial to help open up the upper airway. Eucalyptus can be helpful.  If recurrence occurs, consider allergy testing and sinus CT Scan.
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
## 2023-12-21 NOTE — Progress Notes (Deleted)
   Established Patient Office Visit  Subjective:  Patient ID: Kathryn Lynn, female    DOB: May 27, 1951  Age: 73 y.o. MRN: [id number]  Chief Complaint  Patient presents with   Ear Pain    HPI  {History (Optional):23778}  ROS: as noted in HPI  Objective:     There were no vitals taken for this visit. {Vitals History (Optional):23777}  Physical Exam   No results found for any visits on 12/21/23.  {Labs (Optional):23779}  The 10-year ASCVD risk score (Arnett DK, et al., 2019) is: 11.6%  Assessment & Plan:  There are no diagnoses linked to this encounter.   No follow-ups on file.   Maretta Bees, PA

## 2023-12-28 DIAGNOSIS — H0279 Other degenerative disorders of eyelid and periocular area: Secondary | ICD-10-CM | POA: Diagnosis not present

## 2023-12-28 DIAGNOSIS — Z01818 Encounter for other preprocedural examination: Secondary | ICD-10-CM | POA: Diagnosis not present

## 2023-12-28 DIAGNOSIS — D485 Neoplasm of uncertain behavior of skin: Secondary | ICD-10-CM | POA: Diagnosis not present

## 2024-01-02 ENCOUNTER — Ambulatory Visit: Payer: Self-pay | Admitting: Family Medicine

## 2024-01-02 NOTE — Telephone Encounter (Signed)
  Chief Complaint: elevated HR Symptoms: asymptomatic at this time Frequency: 1 month approx Pertinent Negatives: Patient denies CP, SOB Disposition: [] ED /[] Urgent Care (no appt availability in office) / [x] Appointment(In office/virtual)/ []  [facility]/ [] Home Care/ [] Refused Recommended Disposition /[] [facility]/ []  Follow-up with PCP Additional Notes: Patient calls stating that she was seen approx 1 month ago for infection and elevated HR, completed two rounds of abx and went to give blood yesterday and her HR was elevated at 108. Reports she is asymptomatic at this time. Per protocol, patient to be evaluated within 3 days. Scheduled with PCP for 01/03/24 at 1040. Care advice reviewed, patient verbalized understanding. Alerting PCP for review.   Copied from CRM [id number]. Topic: Clinical - Red Word Triage >> Jan 02, 2024 11:42 AM Corin V wrote: [facility] that prompted transfer to Nurse Triage: Patient had high heart rate about a month ago. Dr. Claiborne Billings though it may be an infection and patient has been through 2 rounds of antibiotics. When she went to donate blood yesterday, her heart rate was 108. Reason for Disposition  [1] Palpitations AND [2] no improvement after using Care Advice  Answer Assessment - Initial Assessment Questions 1. DESCRIPTION: "Please describe your heart rate or heartbeat that you are having" (e.g., fast/slow, regular/irregular, skipped or extra beats, "palpitations")     Feels sluggish at times, but states that when she went to give blood yesterday her HR was 108 and she could not donate. 2. ONSET: "When did it start?" (Minutes, hours or days)      Approx 1 month, was placed on antibiotics (2 rounds), just completed the second round. 3. DURATION: "How long does it last" (e.g., seconds, minutes, hours)    In the last week she feels like "something is different". 4. PATTERN "Does it come and go, or has it been constant since it started?"  "Does it get  worse with exertion?"   "Are you feeling it now?"     Does not have not have palpitations, States yesterday before her HR was taken she was extremely angry and had walked away from a card game, then HR was checked. 5. TAP: "Using your hand, can you tap out what you are feeling on a chair or table in front of you, so that I can hear?" (Note: not all patients can do this)       Patient unable to do this 6. HEART RATE: "Can you tell me your heart rate?" "How many beats in 15 seconds?"  (Note: not all patients can do this)       She cannot check it. 7. RECURRENT SYMPTOM: "Have you ever had this before?" If Yes, ask: "When was the last time?" and "What happened that time?"      Approx 1 months 8. CAUSE: "What do you think is causing the palpitations?"     Infection, but not sure. 9. CARDIAC HISTORY: "Do you have any history of heart disease?" (e.g., heart attack, angina, bypass surgery, angioplasty, arrhythmia)      Denies 10. OTHER SYMPTOMS: "Do you have any other symptoms?" (e.g., dizziness, chest pain, sweating, difficulty breathing)       States at times she has pain under right rib, but does not have it at this.  Protocols used: Heart Rate and Heartbeat Questions-A-AH

## 2024-01-02 NOTE — Telephone Encounter (Signed)
Pt sched for 1/29

## 2024-01-03 ENCOUNTER — Ambulatory Visit (INDEPENDENT_AMBULATORY_CARE_PROVIDER_SITE_OTHER): Payer: Medicare HMO | Admitting: Family Medicine

## 2024-01-03 ENCOUNTER — Encounter: Payer: Self-pay | Admitting: Family Medicine

## 2024-01-03 VITALS — BP 112/72 | HR 85 | Temp 97.5°F | Wt 187.6 lb

## 2024-01-03 DIAGNOSIS — R000 Tachycardia, unspecified: Secondary | ICD-10-CM | POA: Diagnosis not present

## 2024-01-03 DIAGNOSIS — M62838 Other muscle spasm: Secondary | ICD-10-CM
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
## 2024-01-03 NOTE — Patient Instructions (Signed)

## 2024-01-15 ENCOUNTER — Other Ambulatory Visit (HOSPITAL_COMMUNITY): Payer: Self-pay | Admitting: Family Medicine

## 2024-01-15 DIAGNOSIS — Z1231 Encounter for screening mammogram for malignant neoplasm of breast: Secondary | ICD-10-CM

## 2024-01-24 ENCOUNTER — Ambulatory Visit (HOSPITAL_COMMUNITY): Payer: Medicare HMO

## 2024-01-31 ENCOUNTER — Ambulatory Visit (HOSPITAL_COMMUNITY)
Admission: RE | Admit: 2024-01-31 | Discharge: 2024-01-31 | Disposition: A | Discharge status: Home / Self Care | Payer: Medicare HMO | Source: Ambulatory Visit | Attending: Family Medicine | Admitting: Family Medicine

## 2024-01-31 DIAGNOSIS — Z1231 Encounter for screening mammogram for malignant neoplasm of breast: Secondary | ICD-10-CM | POA: Diagnosis not present

## 2024-02-02 ENCOUNTER — Encounter: Payer: Self-pay | Admitting: Family Medicine

## 2024-02-27 ENCOUNTER — Ambulatory Visit (INDEPENDENT_AMBULATORY_CARE_PROVIDER_SITE_OTHER): Payer: Medicare HMO | Admitting: Family Medicine

## 2024-02-27 ENCOUNTER — Encounter: Payer: Self-pay | Admitting: Family Medicine

## 2024-02-27 VITALS — BP 124/80 | HR 86 | Temp 98.1°F | Ht 62.0 in | Wt 189.2 lb

## 2024-02-27 DIAGNOSIS — E782 Mixed hyperlipidemia: Secondary | ICD-10-CM | POA: Diagnosis not present

## 2024-02-27 DIAGNOSIS — M8588 Other specified disorders of bone density and structure, other site: Secondary | ICD-10-CM | POA: Diagnosis not present

## 2024-02-27 DIAGNOSIS — N1832 Chronic kidney disease, stage 3b: Secondary | ICD-10-CM | POA: Diagnosis not present

## 2024-02-27 DIAGNOSIS — Z0000 Encounter for general adult medical examination without abnormal findings: Secondary | ICD-10-CM | POA: Diagnosis not present

## 2024-02-27 DIAGNOSIS — I10 Essential (primary) hypertension: Secondary | ICD-10-CM

## 2024-02-27 DIAGNOSIS — E669 Obesity, unspecified: Secondary | ICD-10-CM

## 2024-02-27 DIAGNOSIS — R7309 Other abnormal glucose: Secondary | ICD-10-CM | POA: Diagnosis not present

## 2024-02-27 DIAGNOSIS — E611 Iron deficiency: Secondary | ICD-10-CM | POA: Insufficient documentation

## 2024-02-27 LAB — IBC + FERRITIN
Ferritin: 19.5 ng/mL (ref 10.0–291.0)
Iron: 73 ug/dL (ref 42–145)
Saturation Ratios: 22.2 % (ref 20.0–50.0)
TIBC: 329 ug/dL (ref 250.0–450.0)
Transferrin: 235 mg/dL (ref 212.0–360.0)

## 2024-02-27 LAB — CBC
HCT: 43.5 % (ref 36.0–46.0)
Hemoglobin: 14.2 g/dL (ref 12.0–15.0)
MCHC: 32.5 g/dL (ref 30.0–36.0)
MCV: 88.2 fl (ref 78.0–100.0)
Platelets: 218 10*3/uL (ref 150.0–400.0)
RBC: 4.94 Mil/uL (ref 3.87–5.11)
RDW: 14.7 % (ref 11.5–15.5)
WBC: 5.3 10*3/uL (ref 4.0–10.5)

## 2024-02-27 LAB — HEMOGLOBIN A1C: Hgb A1c MFr Bld: 6.1 % (ref 4.6–6.5)

## 2024-02-27 LAB — COMPREHENSIVE METABOLIC PANEL
ALT: 12 U/L (ref 0–35)
AST: 17 U/L (ref 0–37)
Albumin: 4.5 g/dL (ref 3.5–5.2)
Alkaline Phosphatase: 55 U/L (ref 39–117)
BUN: 16 mg/dL (ref 6–23)
CO2: 30 meq/L (ref 19–32)
Calcium: 9.2 mg/dL (ref 8.4–10.5)
Chloride: 104 meq/L (ref 96–112)
Creatinine, Ser: 1.38 mg/dL — ABNORMAL HIGH (ref 0.40–1.20)
GFR: 38.2 mL/min — ABNORMAL LOW (ref >60.00)
Glucose, Bld: 89 mg/dL (ref 70–99)
Potassium: 4.6 meq/L (ref 3.5–5.1)
Sodium: 142 meq/L (ref 135–145)
Total Bilirubin: 0.9 mg/dL (ref 0.2–1.2)
Total Protein: 6.9 g/dL (ref 6.0–8.3)

## 2024-02-27 MED ORDER — POLYSACCHARIDE IRON COMPLEX 150 MG PO CAPS: 150.0000 mg | ORAL_CAPSULE | ORAL | 1 refills | Status: DC

## 2024-02-27 MED ORDER — GABAPENTIN 300 MG PO CAPS: 300.0000 mg | ORAL_CAPSULE | Freq: Three times a day (TID) | ORAL | 1 refills | Status: DC

## 2024-02-27 MED ORDER — CYCLOBENZAPRINE HCL 5 MG PO TABS: ORAL_TABLET | ORAL | 5 refills | Status: DC
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
## 2024-02-27 NOTE — Patient Instructions (Signed)

## 2024-02-28 ENCOUNTER — Encounter: Payer: Self-pay | Admitting: Family Medicine

## 2024-03-21 DIAGNOSIS — D23112 Other benign neoplasm of skin of right lower eyelid, including canthus: Secondary | ICD-10-CM | POA: Diagnosis not present

## 2024-03-21 DIAGNOSIS — L723 Sebaceous cyst: Secondary | ICD-10-CM | POA: Diagnosis not present

## 2024-03-21 DIAGNOSIS — D485 Neoplasm of uncertain behavior of skin: Secondary | ICD-10-CM | POA: Diagnosis not present

## 2024-04-11 DIAGNOSIS — I10 Essential (primary) hypertension: Secondary | ICD-10-CM | POA: Diagnosis not present

## 2024-04-11 DIAGNOSIS — N189 Chronic kidney disease, unspecified: Secondary | ICD-10-CM | POA: Diagnosis not present

## 2024-04-11 DIAGNOSIS — E211 Secondary hyperparathyroidism, not elsewhere classified: Secondary | ICD-10-CM | POA: Diagnosis not present

## 2024-04-11 DIAGNOSIS — D631 Anemia in chronic kidney disease: Secondary | ICD-10-CM | POA: Diagnosis not present

## 2024-04-11 DIAGNOSIS — R809 Proteinuria, unspecified: Secondary | ICD-10-CM | POA: Diagnosis not present

## 2024-04-18 DIAGNOSIS — N1832 Chronic kidney disease, stage 3b: Secondary | ICD-10-CM | POA: Diagnosis not present

## 2024-04-18 DIAGNOSIS — I129 Hypertensive chronic kidney disease with stage 1 through stage 4 chronic kidney disease, or unspecified chronic kidney disease: Secondary | ICD-10-CM | POA: Diagnosis not present

## 2024-04-18 DIAGNOSIS — E2740 Unspecified adrenocortical insufficiency: Secondary | ICD-10-CM | POA: Diagnosis not present

## 2024-04-18 DIAGNOSIS — G4733 Obstructive sleep apnea (adult) (pediatric): Secondary | ICD-10-CM | POA: Diagnosis not present

## 2024-05-01 ENCOUNTER — Ambulatory Visit (INDEPENDENT_AMBULATORY_CARE_PROVIDER_SITE_OTHER): Payer: Medicare HMO | Admitting: *Deleted

## 2024-05-01 DIAGNOSIS — Z780 Asymptomatic menopausal state: Secondary | ICD-10-CM

## 2024-05-01 DIAGNOSIS — Z0000 Encounter for general adult medical examination without abnormal findings: Secondary | ICD-10-CM
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
## 2024-05-01 NOTE — Patient Instructions (Signed)
 Kathryn Lynn , Thank you for taking time to come for your Medicare Wellness Visit. I appreciate your ongoing commitment to your health goals. Please review the following plan we discussed and let me know if I can assist you in the future.   Screening recommendations/referrals: Colonoscopy: up to date Mammogram: up to date Bone Density: up to date Recommended yearly ophthalmology/optometry visit for glaucoma screening and checkup Recommended yearly dental visit for hygiene and checkup  Vaccinations: Influenza vaccine: up to date Pneumococcal vaccine: up to date Tdap vaccine: up to date Shingles vaccine: up to date       Preventive Care 65 Years and Older, Female Preventive care refers to lifestyle choices and visits with your health care provider that can promote health and wellness. What does preventive care include? A yearly physical exam. This is also called an annual well check. Dental exams once or twice a year. Routine eye exams. Ask your health care provider how often you should have your eyes checked. Personal lifestyle choices, including: Daily care of your teeth and gums. Regular physical activity. Eating a healthy diet. Avoiding tobacco and drug use. Limiting alcohol use. Practicing safe sex. Taking low-dose aspirin every day. Taking vitamin and mineral supplements as recommended by your health care provider. What happens during an annual well check? The services and screenings done by your health care provider during your annual well check will depend on your age, overall health, lifestyle risk factors, and family history of disease. Counseling  Your health care provider may ask you questions about your: Alcohol use. Tobacco use. Drug use. Emotional well-being. Home and relationship well-being. Sexual activity. Eating habits. History of falls. Memory and ability to understand (cognition). Work and work Astronomer. Reproductive health. Screening  You may have  the following tests or measurements: Height, weight, and BMI. Blood pressure. Lipid and cholesterol levels. These may be checked every 5 years, or more frequently if you are over 49 years old. Skin check. Lung cancer screening. You may have this screening every year starting at age 64 if you have a 30-pack-year history of smoking and currently smoke or have quit within the past 15 years. Fecal occult blood test (FOBT) of the stool. You may have this test every year starting at age 91. Flexible sigmoidoscopy or colonoscopy. You may have a sigmoidoscopy every 5 years or a colonoscopy every 10 years starting at age 67. Hepatitis C blood test. Hepatitis B blood test. Sexually transmitted disease (STD) testing. Diabetes screening. This is done by checking your blood sugar (glucose) after you have not eaten for a while (fasting). You may have this done every 1-3 years. Bone density scan. This is done to screen for osteoporosis. You may have this done starting at age 30. Mammogram. This may be done every 1-2 years. Talk to your health care provider about how often you should have regular mammograms. Talk with your health care provider about your test results, treatment options, and if necessary, the need for more tests. Vaccines  Your health care provider may recommend certain vaccines, such as: Influenza vaccine. This is recommended every year. Tetanus, diphtheria, and acellular pertussis (Tdap, Td) vaccine. You may need a Td booster every 10 years. Zoster vaccine. You may need this after age 49. Pneumococcal 13-valent conjugate (PCV13) vaccine. One dose is recommended after age 62. Pneumococcal polysaccharide (PPSV23) vaccine. One dose is recommended after age 54. Talk to your health care provider about which screenings and vaccines you need and how often you need them.  This information is not intended to replace advice given to you by your health care provider. Make sure you discuss any questions  you have with your health care provider. Document Released: 12/18/2015 Document Revised: 08/10/2016 Document Reviewed: 09/22/2015 Elsevier Interactive Patient Education  2017 [employer].  Fall Prevention in the Home Falls can cause injuries. They can happen to people of all ages. There are many things you can do to make your home safe and to help prevent falls. What can I do on the outside of my home? Regularly fix the edges of walkways and driveways and fix any cracks. Remove anything that might make you trip as you walk through a door, such as a raised step or threshold. Trim any bushes or trees on the path to your home. Use bright outdoor lighting. Clear any walking paths of anything that might make someone trip, such as rocks or tools. Regularly check to see if handrails are loose or broken. Make sure that both sides of any steps have handrails. Any raised decks and porches should have guardrails on the edges. Have any leaves, snow, or ice cleared regularly. Use sand or salt on walking paths during winter. Clean up any spills in your garage right away. This includes oil or grease spills. What can I do in the bathroom? Use night lights. Install grab bars by the toilet and in the tub and shower. Do not use towel bars as grab bars. Use non-skid mats or decals in the tub or shower. If you need to sit down in the shower, use a plastic, non-slip stool. Keep the floor dry. Clean up any water that spills on the floor as soon as it happens. Remove soap buildup in the tub or shower regularly. Attach bath mats securely with double-sided non-slip rug tape. Do not have throw rugs and other things on the floor that can make you trip. What can I do in the bedroom? Use night lights. Make sure that you have a light by your bed that is easy to reach. Do not use any sheets or blankets that are too big for your bed. They should not hang down onto the floor. Have a firm chair that has side arms. You  can use this for support while you get dressed. Do not have throw rugs and other things on the floor that can make you trip. What can I do in the kitchen? Clean up any spills right away. Avoid walking on wet floors. Keep items that you use a lot in easy-to-reach places. If you need to reach something above you, use a strong step stool that has a grab bar. Keep electrical cords out of the way. Do not use floor polish or wax that makes floors slippery. If you must use wax, use non-skid floor wax. Do not have throw rugs and other things on the floor that can make you trip. What can I do with my stairs? Do not leave any items on the stairs. Make sure that there are handrails on both sides of the stairs and use them. Fix handrails that are broken or loose. Make sure that handrails are as long as the stairways. Check any carpeting to make sure that it is firmly attached to the stairs. Fix any carpet that is loose or worn. Avoid having throw rugs at the top or bottom of the stairs. If you do have throw rugs, attach them to the floor with carpet tape. Make sure that you have a light switch at the  top of the stairs and the bottom of the stairs. If you do not have them, ask someone to add them for you. What else can I do to help prevent falls? Wear shoes that: Do not have high heels. Have rubber bottoms. Are comfortable and fit you well. Are closed at the toe. Do not wear sandals. If you use a stepladder: Make sure that it is fully opened. Do not climb a closed stepladder. Make sure that both sides of the stepladder are locked into place. Ask someone to hold it for you, if possible. Clearly mark and make sure that you can see: Any grab bars or handrails. First and last steps. Where the edge of each step is. Use tools that help you move around (mobility aids) if they are needed. These include: Canes. Walkers. Scooters. Crutches. Turn on the lights when you go into a dark area. Replace any  light bulbs as soon as they burn out. Set up your furniture so you have a clear path. Avoid moving your furniture around. If any of your floors are uneven, fix them. If there are any pets around you, be aware of where they are. Review your medicines with your doctor. Some medicines can make you feel dizzy. This can increase your chance of falling. Ask your doctor what other things that you can do to help prevent falls. This information is not intended to replace advice given to you by your health care provider. Make sure you discuss any questions you have with your health care provider. Document Released: 09/17/2009 Document Revised: 04/28/2016 Document Reviewed: 12/26/2014 Elsevier Interactive Patient Education  2017 [employer].

## 2024-05-03 ENCOUNTER — Ambulatory Visit (INDEPENDENT_AMBULATORY_CARE_PROVIDER_SITE_OTHER): Admitting: Family Medicine

## 2024-05-03 ENCOUNTER — Encounter: Payer: Self-pay | Admitting: Family Medicine

## 2024-05-03 VITALS — BP 114/80 | HR 95 | Temp 98.2°F | Wt 188.0 lb

## 2024-05-03 DIAGNOSIS — G44209 Tension-type headache, unspecified, not intractable: Secondary | ICD-10-CM

## 2024-05-03 DIAGNOSIS — R400 Somnolence: Secondary | ICD-10-CM

## 2024-05-03 DIAGNOSIS — M542 Cervicalgia: Secondary | ICD-10-CM

## 2024-05-03 DIAGNOSIS — E669 Obesity, unspecified: Secondary | ICD-10-CM

## 2024-05-03 MED ORDER — PREDNISONE 20 MG PO TABS: ORAL_TABLET | ORAL | 0 refills | Status: DC

## 2024-05-03 MED ORDER — BACLOFEN 10 MG PO TABS: 5.0000 mg | ORAL_TABLET | Freq: Two times a day (BID) | ORAL | 1 refills | Status: DC

## 2024-05-03 NOTE — Patient Instructions (Addendum)
 Decrease zyrtec to 1/2 tab before bed.   Start prednisone  and baclofen per label instructions.    Follow up in 6 weeks.   Return in about 6 weeks (around 06/14/2024) for neck/head pain/fatigue.        Great to see you today.  I have refilled the medication(s) we provide.   If labs were collected or images ordered, we will inform you of  results once we have received them and reviewed. We will contact you either by echart message, or telephone call.  Please give ample time to the testing facility, and our office to run,  receive and review results. Please do not call inquiring of results, even if you can see them in your chart. We will contact you as soon as we are able. If it has been over 1 week since the test was completed, and you have not yet heard from us , then please call us .    - echart message- for normal results that have been seen by the patient already.   - telephone call: abnormal results or if patient has not viewed results in their echart.  If a referral to a specialist was entered for you, please call us  in 2 weeks if you have not heard from the specialist office to schedule.
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
## 2024-05-20 ENCOUNTER — Telehealth: Payer: Self-pay

## 2024-05-20 NOTE — Telephone Encounter (Signed)
 Pt would like the address of her PT referral changed to a new location.   Copied from CRM [id number]. Topic: Referral - Question >> May 20, 2024  9:58 AM Antonieta Kitten wrote: Reason for CRM: Patient would like for her physical therapy referral to be sent to a different location, a location that's closer to her home. Location will be [facility] at [address] contact number is [phone]

## 2024-05-21 ENCOUNTER — Ambulatory Visit (INDEPENDENT_AMBULATORY_CARE_PROVIDER_SITE_OTHER): Admitting: Family Medicine

## 2024-05-21 ENCOUNTER — Encounter: Payer: Self-pay | Admitting: Family Medicine

## 2024-05-21 VITALS — BP 114/74 | HR 90 | Temp 98.2°F | Wt 190.0 lb

## 2024-05-21 DIAGNOSIS — J988 Other specified respiratory disorders: Secondary | ICD-10-CM

## 2024-05-21 DIAGNOSIS — R051 Acute cough: Secondary | ICD-10-CM

## 2024-05-21 DIAGNOSIS — B9689 Other specified bacterial agents as the cause of diseases classified elsewhere: Secondary | ICD-10-CM | POA: Diagnosis not present

## 2024-05-21 MED ORDER — DOXYCYCLINE HYCLATE 100 MG PO TABS: 100.0000 mg | ORAL_TABLET | Freq: Two times a day (BID) | ORAL | 0 refills | Status: DC

## 2024-05-21 NOTE — Patient Instructions (Addendum)
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
## 2024-05-28 ENCOUNTER — Other Ambulatory Visit: Payer: Self-pay

## 2024-05-28 ENCOUNTER — Ambulatory Visit: Attending: Family Medicine | Admitting: Physical Therapy

## 2024-05-28 ENCOUNTER — Encounter: Payer: Self-pay | Admitting: Physical Therapy

## 2024-05-28 DIAGNOSIS — M62838 Other muscle spasm: Secondary | ICD-10-CM | POA: Insufficient documentation

## 2024-05-28 DIAGNOSIS — M542 Cervicalgia: Secondary | ICD-10-CM | POA: Diagnosis not present

## 2024-05-28 DIAGNOSIS — G44209 Tension-type headache, unspecified, not intractable: Secondary | ICD-10-CM | POA: Diagnosis not present
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
## 2024-05-30 ENCOUNTER — Ambulatory Visit (HOSPITAL_COMMUNITY)
Admission: RE | Admit: 2024-05-30 | Discharge: 2024-05-30 | Disposition: A | Discharge status: Home / Self Care | Source: Ambulatory Visit | Attending: Family Medicine | Admitting: Family Medicine

## 2024-05-30 DIAGNOSIS — M85851 Other specified disorders of bone density and structure, right thigh: Secondary | ICD-10-CM | POA: Diagnosis not present

## 2024-05-30 DIAGNOSIS — Z780 Asymptomatic menopausal state: Secondary | ICD-10-CM | POA: Diagnosis not present

## 2024-05-30 DIAGNOSIS — M85852 Other specified disorders of bone density and structure, left thigh: Secondary | ICD-10-CM | POA: Diagnosis not present

## 2024-05-31 ENCOUNTER — Ambulatory Visit: Payer: Self-pay | Admitting: Family Medicine

## 2024-06-04 ENCOUNTER — Encounter: Payer: Self-pay | Admitting: Physical Therapy

## 2024-06-04 ENCOUNTER — Ambulatory Visit: Attending: Family Medicine | Admitting: Physical Therapy

## 2024-06-04 DIAGNOSIS — M62838 Other muscle spasm: Secondary | ICD-10-CM | POA: Insufficient documentation

## 2024-06-04 DIAGNOSIS — M542 Cervicalgia: Secondary | ICD-10-CM | POA: Insufficient documentation
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
## 2024-06-11 ENCOUNTER — Ambulatory Visit: Admitting: Physical Therapy

## 2024-06-11 DIAGNOSIS — M542 Cervicalgia: Secondary | ICD-10-CM | POA: Diagnosis not present

## 2024-06-11 DIAGNOSIS — M62838 Other muscle spasm: Secondary | ICD-10-CM
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
## 2024-06-13 ENCOUNTER — Encounter: Payer: Self-pay | Admitting: Physical Therapy

## 2024-06-13 ENCOUNTER — Ambulatory Visit: Admitting: Physical Therapy

## 2024-06-13 DIAGNOSIS — M62838 Other muscle spasm: Secondary | ICD-10-CM | POA: Diagnosis not present

## 2024-06-13 DIAGNOSIS — M542 Cervicalgia: Secondary | ICD-10-CM
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
## 2024-06-14 ENCOUNTER — Ambulatory Visit: Admitting: Family Medicine

## 2024-06-24 ENCOUNTER — Telehealth: Payer: Self-pay

## 2024-06-24 ENCOUNTER — Ambulatory Visit (INDEPENDENT_AMBULATORY_CARE_PROVIDER_SITE_OTHER): Admitting: Family Medicine

## 2024-06-24 ENCOUNTER — Encounter: Payer: Self-pay | Admitting: Family Medicine

## 2024-06-24 VITALS — BP 116/80 | HR 90 | Temp 98.3°F | Wt 190.4 lb

## 2024-06-24 DIAGNOSIS — M542 Cervicalgia: Secondary | ICD-10-CM

## 2024-06-24 DIAGNOSIS — R5383 Other fatigue: Secondary | ICD-10-CM | POA: Diagnosis not present

## 2024-06-24 MED ORDER — BACLOFEN 10 MG PO TABS: 5.0000 mg | ORAL_TABLET | Freq: Two times a day (BID) | ORAL | 5 refills | Status: DC

## 2024-06-24 NOTE — Telephone Encounter (Signed)
 Referral info sent to pt
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
## 2024-06-24 NOTE — Patient Instructions (Signed)
 Decrease zyrtec dose to half tab before bed   Gabapentin  1 tab twice a day only. See if this helps with sleepiness.    I am glad you neck is feeling better, continue PT and try adding heat.           Great to see you today.  I have refilled the medication(s) we provide.   If labs were collected or images ordered, we will inform you of  results once we have received them and reviewed. We will contact you either by echart message, or telephone call.  Please give ample time to the testing facility, and our office to run,  receive and review results. Please do not call inquiring of results, even if you can see them in your chart. We will contact you as soon as we are able. If it has been over 1 week since the test was completed, and you have not yet heard from us , then please call us .    - echart message- for normal results that have been seen by the patient already.   - telephone call: abnormal results or if patient has not viewed results in their echart.  If a referral to a specialist was entered for you, please call us  in 2 weeks if you have not heard from the specialist office to schedule.

## 2024-06-25 DIAGNOSIS — Z6834 Body mass index (BMI) 34.0-34.9, adult: Secondary | ICD-10-CM | POA: Diagnosis not present

## 2024-06-25 DIAGNOSIS — M419 Scoliosis, unspecified: Secondary | ICD-10-CM | POA: Diagnosis not present

## 2024-07-01 ENCOUNTER — Other Ambulatory Visit (HOSPITAL_COMMUNITY): Payer: Self-pay | Admitting: Neurosurgery

## 2024-07-01 DIAGNOSIS — M419 Scoliosis, unspecified: Secondary | ICD-10-CM

## 2024-07-05 ENCOUNTER — Other Ambulatory Visit: Payer: Self-pay

## 2024-07-05 ENCOUNTER — Ambulatory Visit (HOSPITAL_COMMUNITY)
Admission: RE | Admit: 2024-07-05 | Discharge: 2024-07-05 | Disposition: A | Discharge status: Home / Self Care | Source: Ambulatory Visit | Attending: Neurosurgery | Admitting: Neurosurgery

## 2024-07-05 DIAGNOSIS — M48061 Spinal stenosis, lumbar region without neurogenic claudication: Secondary | ICD-10-CM | POA: Diagnosis not present

## 2024-07-05 DIAGNOSIS — M5459 Other low back pain: Secondary | ICD-10-CM | POA: Diagnosis not present

## 2024-07-05 DIAGNOSIS — I700 Atherosclerosis of aorta: Secondary | ICD-10-CM | POA: Diagnosis not present

## 2024-07-05 DIAGNOSIS — Z981 Arthrodesis status: Secondary | ICD-10-CM | POA: Insufficient documentation

## 2024-07-05 DIAGNOSIS — M419 Scoliosis, unspecified: Secondary | ICD-10-CM

## 2024-07-05 DIAGNOSIS — M5416 Radiculopathy, lumbar region: Secondary | ICD-10-CM | POA: Insufficient documentation

## 2024-07-05 DIAGNOSIS — M8580 Other specified disorders of bone density and structure, unspecified site: Secondary | ICD-10-CM | POA: Diagnosis not present

## 2024-07-05 DIAGNOSIS — M5135 Other intervertebral disc degeneration, thoracolumbar region: Secondary | ICD-10-CM | POA: Diagnosis not present

## 2024-07-05 DIAGNOSIS — M79605 Pain in left leg: Secondary | ICD-10-CM | POA: Diagnosis not present

## 2024-07-05 MED ORDER — HYDROCODONE-ACETAMINOPHEN 5-325 MG PO TABS: 1.0000 | ORAL_TABLET | ORAL | Status: DC | PRN

## 2024-07-05 MED ORDER — IOHEXOL 350 MG/ML SOLN: 1.0000 mL | Freq: Once | INTRAVENOUS | Status: DC | PRN

## 2024-07-05 MED ORDER — IOHEXOL 180 MG/ML  SOLN
20.0000 mL | Freq: Once | INTRAMUSCULAR | Status: AC | PRN
  Administered 2024-07-05: 20 mL via INTRATHECAL

## 2024-07-05 MED ORDER — LIDOCAINE HCL (PF) 1 % IJ SOLN
5.0000 mL | Freq: Once | INTRAMUSCULAR | Status: AC
  Administered 2024-07-05: 5 mL via INTRADERMAL

## 2024-07-05 MED ORDER — ONDANSETRON HCL 4 MG/2ML IJ SOLN: 4.0000 mg | Freq: Four times a day (QID) | INTRAMUSCULAR | Status: DC | PRN

## 2024-07-05 NOTE — Op Note (Signed)
*   No surgery found * lumbar Myelogram  PATIENT:  Kathryn Lynn is a 73 y.o. female  PRE-OPERATIVE DIAGNOSIS:  lumbago With low back pain POST-OPERATIVE DIAGNOSIS:  lumbago  PROCEDURE:  Lumbar Myelogram  SURGEON:  Massai Hankerson  ANESTHESIA:   local LOCAL MEDICATIONS USED:  LIDOCAINE   Procedure Note: [name] is a 73 y.o. female Was taken to the fluoroscopy suite and  positioned prone on the fluoroscopy table. Her back was prepared and draped in a sterile manner. I infiltrated 6 cc into the lumbar region. I then introduced a spinal needle into the thecal sac at the L4/5 interlaminar space. I infiltrated 20cc of Isovue [id number] into the thecal sac. Fluoroscopy showed the needle and contrast in the thecal sac. Kayslee E Markos tolerated the procedure well. she Will be taken to CT for evaluation.     PATIENT DISPOSITION:  Short Stay

## 2024-07-05 NOTE — Discharge Instructions (Signed)
Myelogram and Lumbar Puncture Discharge Instructions  Go home and rest quietly for the next 24 hours.  It is important to lie flat for the next 24 hours.  Get up only to go to the restroom.  You may lie in the bed or on a couch on your back, your stomach, your left side or your right side.  You may have one pillow under your head.  You may have pillows between your knees while you are on your side or under your knees while you are on your back.  DO NOT drive today.  Recline the seat as far back as it will go, while still wearing your seat belt, on the way home.  You may get up to go to the bathroom as needed.  You may sit up for 10 minutes to eat.  You may resume your normal diet and medications unless otherwise indicated.  The incidence of headache, nausea, or vomiting is about 5% (one in 20 patients).  If you develop a headache, lie flat and drink plenty of fluids until the headache goes away.  Caffeinated beverages may be helpful.  If you develop severe nausea and vomiting or a headache that does not go away with flat bed rest, call Dr Evelena Asa may resume normal activities after your 24 hours of bed rest is over; however, do not exert yourself strongly or do any heavy lifting tomorrow.  Call your physician for a follow-up appointment.  The results of your myelogram will be sent directly to your physician by the following day.  If you have any questions or if complications develop after you arrive home, please call Dr Christella Noa  Discharge instructions have been explained to the patient.  The patient, or the person responsible for the patient, fully understands these instructions.

## 2024-07-11 DIAGNOSIS — Z6834 Body mass index (BMI) 34.0-34.9, adult: Secondary | ICD-10-CM | POA: Diagnosis not present

## 2024-07-11 DIAGNOSIS — H52223 Regular astigmatism, bilateral: Secondary | ICD-10-CM | POA: Diagnosis not present

## 2024-07-11 DIAGNOSIS — H524 Presbyopia: Secondary | ICD-10-CM | POA: Diagnosis not present

## 2024-07-11 DIAGNOSIS — M419 Scoliosis, unspecified: Secondary | ICD-10-CM | POA: Diagnosis not present

## 2024-07-11 DIAGNOSIS — H5213 Myopia, bilateral: Secondary | ICD-10-CM | POA: Diagnosis not present

## 2024-07-11 DIAGNOSIS — M25552 Pain in left hip: Secondary | ICD-10-CM | POA: Diagnosis not present

## 2024-07-11 DIAGNOSIS — H2513 Age-related nuclear cataract, bilateral: Secondary | ICD-10-CM | POA: Diagnosis not present

## 2024-07-11 DIAGNOSIS — M48061 Spinal stenosis, lumbar region without neurogenic claudication: Secondary | ICD-10-CM | POA: Diagnosis not present

## 2024-07-17 DIAGNOSIS — H524 Presbyopia: Secondary | ICD-10-CM | POA: Diagnosis not present

## 2024-07-25 ENCOUNTER — Other Ambulatory Visit: Payer: Self-pay

## 2024-07-25 ENCOUNTER — Encounter: Payer: Self-pay | Admitting: Orthopaedic Surgery

## 2024-07-25 ENCOUNTER — Other Ambulatory Visit (INDEPENDENT_AMBULATORY_CARE_PROVIDER_SITE_OTHER): Payer: Self-pay

## 2024-07-25 ENCOUNTER — Ambulatory Visit: Admitting: Orthopaedic Surgery

## 2024-07-25 VITALS — BP 150/81 | HR 97 | Ht 62.0 in | Wt 185.0 lb

## 2024-07-25 DIAGNOSIS — M25562 Pain in left knee: Secondary | ICD-10-CM

## 2024-07-25 DIAGNOSIS — M170 Bilateral primary osteoarthritis of knee: Secondary | ICD-10-CM | POA: Diagnosis not present

## 2024-07-25 DIAGNOSIS — G8929 Other chronic pain: Secondary | ICD-10-CM

## 2024-07-25 DIAGNOSIS — M25561 Pain in right knee: Secondary | ICD-10-CM | POA: Diagnosis not present
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
## 2024-07-26 ENCOUNTER — Encounter: Payer: Self-pay | Admitting: Radiology

## 2024-07-31 ENCOUNTER — Other Ambulatory Visit: Payer: Self-pay | Admitting: Family Medicine

## 2024-08-07 ENCOUNTER — Encounter: Payer: Self-pay | Admitting: Orthopaedic Surgery

## 2024-08-07 ENCOUNTER — Ambulatory Visit: Admitting: Orthopaedic Surgery

## 2024-08-07 VITALS — BP 129/78 | HR 76

## 2024-08-07 DIAGNOSIS — G8929 Other chronic pain: Secondary | ICD-10-CM

## 2024-08-07 DIAGNOSIS — M25561 Pain in right knee: Secondary | ICD-10-CM | POA: Diagnosis not present

## 2024-08-07 DIAGNOSIS — M25562 Pain in left knee: Secondary | ICD-10-CM

## 2024-08-07 NOTE — Progress Notes (Signed)
 I am better.  Her knees are improved after the injections.  She is walking better.  She has no new trauma.  ROM of both knees is near normal today with no limp,  She has bilateral crepitus and minimal effusions.  NV intact.  Encounter Diagnosis  Name Primary?   Bilateral chronic knee pain Yes   Return in three weeks.  Call if any problem.  Precautions discussed.  Electronically Signed Lemond Stable, MD 9/3/202510:40 AM

## 2024-08-13 ENCOUNTER — Ambulatory Visit: Payer: Self-pay | Admitting: Family Medicine

## 2024-08-13 ENCOUNTER — Encounter: Payer: Self-pay | Admitting: Family Medicine

## 2024-08-13 ENCOUNTER — Ambulatory Visit (INDEPENDENT_AMBULATORY_CARE_PROVIDER_SITE_OTHER): Admitting: Family Medicine

## 2024-08-13 VITALS — BP 118/78 | HR 79 | Temp 98.1°F | Wt 184.8 lb

## 2024-08-13 DIAGNOSIS — Z23 Encounter for immunization: Secondary | ICD-10-CM | POA: Diagnosis not present

## 2024-08-13 DIAGNOSIS — M8588 Other specified disorders of bone density and structure, other site: Secondary | ICD-10-CM

## 2024-08-13 DIAGNOSIS — R7309 Other abnormal glucose: Secondary | ICD-10-CM

## 2024-08-13 DIAGNOSIS — E782 Mixed hyperlipidemia: Secondary | ICD-10-CM

## 2024-08-13 DIAGNOSIS — M519 Unspecified thoracic, thoracolumbar and lumbosacral intervertebral disc disorder: Secondary | ICD-10-CM

## 2024-08-13 DIAGNOSIS — M9979 Connective tissue and disc stenosis of intervertebral foramina of abdomen and other regions: Secondary | ICD-10-CM | POA: Diagnosis not present

## 2024-08-13 DIAGNOSIS — I10 Essential (primary) hypertension: Secondary | ICD-10-CM

## 2024-08-13 DIAGNOSIS — N1832 Chronic kidney disease, stage 3b: Secondary | ICD-10-CM

## 2024-08-13 DIAGNOSIS — E669 Obesity, unspecified: Secondary | ICD-10-CM | POA: Diagnosis not present

## 2024-08-13 DIAGNOSIS — M48062 Spinal stenosis, lumbar region with neurogenic claudication: Secondary | ICD-10-CM

## 2024-08-13 DIAGNOSIS — M5416 Radiculopathy, lumbar region: Secondary | ICD-10-CM

## 2024-08-13 LAB — BASIC METABOLIC PANEL WITH GFR
BUN: 16 mg/dL (ref 6–23)
CO2: 31 meq/L (ref 19–32)
Calcium: 9.4 mg/dL (ref 8.4–10.5)
Chloride: 103 meq/L (ref 96–112)
Creatinine, Ser: 1.35 mg/dL — ABNORMAL HIGH (ref 0.40–1.20)
GFR: 39.09 mL/min — ABNORMAL LOW (ref >60.00)
Glucose, Bld: 79 mg/dL (ref 70–99)
Potassium: 4.6 meq/L (ref 3.5–5.1)
Sodium: 141 meq/L (ref 135–145)

## 2024-08-13 LAB — HEMOGLOBIN A1C: Hgb A1c MFr Bld: 6.2 % (ref 4.6–6.5)

## 2024-08-13 LAB — VITAMIN D 25 HYDROXY (VIT D DEFICIENCY, FRACTURES): VITD: 53.12 ng/mL (ref 30.00–100.00)

## 2024-08-13 MED ORDER — POLYSACCHARIDE IRON COMPLEX 150 MG PO CAPS: 150.0000 mg | ORAL_CAPSULE | ORAL | 3 refills | Status: DC

## 2024-08-13 MED ORDER — SIMVASTATIN 20 MG PO TABS: 20.0000 mg | ORAL_TABLET | Freq: Every day | ORAL | 3 refills | Status: DC

## 2024-08-13 MED ORDER — GABAPENTIN 300 MG PO CAPS: 300.0000 mg | ORAL_CAPSULE | Freq: Three times a day (TID) | ORAL | 1 refills | Status: DC

## 2024-08-13 MED ORDER — CYCLOBENZAPRINE HCL 5 MG PO TABS: ORAL_TABLET | ORAL | 5 refills | Status: DC

## 2024-08-13 NOTE — Patient Instructions (Addendum)
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
## 2024-08-22 ENCOUNTER — Encounter: Payer: Self-pay | Admitting: Adult Health

## 2024-08-22 ENCOUNTER — Ambulatory Visit: Admitting: Adult Health

## 2024-08-22 VITALS — BP 136/80 | HR 102 | Ht 62.0 in | Wt 186.2 lb

## 2024-08-22 DIAGNOSIS — R0683 Snoring: Secondary | ICD-10-CM | POA: Diagnosis not present

## 2024-08-22 DIAGNOSIS — G4733 Obstructive sleep apnea (adult) (pediatric): Secondary | ICD-10-CM | POA: Diagnosis not present

## 2024-08-22 DIAGNOSIS — R400 Somnolence: Secondary | ICD-10-CM

## 2024-08-22 NOTE — Patient Instructions (Signed)
 Set up for home sleep study.  Work on healthy weight.  Use caution with sedating medications  Follow up in 6 weeks to discuss results and treatment options.
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
## 2024-08-28 ENCOUNTER — Ambulatory Visit: Admitting: Orthopaedic Surgery

## 2024-08-29 ENCOUNTER — Ambulatory Visit: Admitting: Orthopaedic Surgery

## 2024-08-29 ENCOUNTER — Encounter: Payer: Self-pay | Admitting: Orthopaedic Surgery

## 2024-08-29 VITALS — BP 131/79 | HR 103 | Ht 62.0 in | Wt 185.0 lb

## 2024-08-29 DIAGNOSIS — M25562 Pain in left knee: Secondary | ICD-10-CM

## 2024-08-29 DIAGNOSIS — M25561 Pain in right knee: Secondary | ICD-10-CM

## 2024-08-29 DIAGNOSIS — G8929 Other chronic pain: Secondary | ICD-10-CM

## 2024-08-29 MED ORDER — METHYLPREDNISOLONE ACETATE 40 MG/ML IJ SUSP
40.0000 mg | Freq: Once | INTRAMUSCULAR | Status: AC
  Administered 2024-08-29: 40 mg via INTRA_ARTICULAR

## 2024-08-29 NOTE — Addendum Note (Signed)
 Addended by: MARCINE HUSBAND T on: 08/29/2024 03:00 PM   Modules accepted: Orders

## 2024-08-29 NOTE — Progress Notes (Signed)
 PROCEDURE NOTE:  The patient requests injections of the left knee , verbal consent was obtained.  The left knee was prepped appropriately after time out was performed.   Sterile technique was observed and injection of 1 cc of DepoMedrol 40 mg with several cc's of plain xylocaine . Anesthesia was provided by ethyl chloride and a 20-gauge needle was used to inject the knee area. The injection was tolerated well.  A band aid dressing was applied.  The patient was advised to apply ice later today and tomorrow to the injection sight as needed.  Encounter Diagnosis  Name Primary?   Bilateral chronic knee pain Yes   Return prn.  I have informed the patient I will be retiring from medical practice and from this office on September 05, 2024.  The patient has been offered continuing care with Dr. Margrette or Dr. Onesimo of this office.  The patient may choose another provider and the records will be forwarded after proper signature and notification.  Patient understands and agrees.  Call if any problem.  Precautions discussed.  Electronically Signed Lemond Stable, MD 9/25/20252:02 PM

## 2024-09-04 ENCOUNTER — Other Ambulatory Visit: Payer: Self-pay | Admitting: Family Medicine

## 2024-09-10 ENCOUNTER — Encounter

## 2024-09-10 DIAGNOSIS — R400 Somnolence: Secondary | ICD-10-CM

## 2024-09-10 DIAGNOSIS — G4730 Sleep apnea, unspecified: Secondary | ICD-10-CM | POA: Diagnosis not present

## 2024-09-29 ENCOUNTER — Telehealth: Payer: Self-pay | Admitting: Pulmonary Disease

## 2024-09-29 DIAGNOSIS — G4733 Obstructive sleep apnea (adult) (pediatric): Secondary | ICD-10-CM | POA: Diagnosis not present

## 2024-09-29 NOTE — Telephone Encounter (Signed)
 Call patient  Sleep study result  Date of study: 09/10/2024  Impression: Mild obstructive sleep apnea with mild oxygen desaturations AHI of 12.9 with O2 nadir of 84%.  Saturations below 88% for 6 minutes  Recommendation: Options for treating mild obstructive sleep apnea may include CPAP therapy if there are significant daytime symptoms or notable comorbidities. Auto CPAP 5-15 with heated humidification and the patient's preferred mask may be considered; other treatment options may include an oral device, watchful waiting with significant weight loss efforts.  Close clinical follow-up for optimization of treatment

## 2024-09-30 NOTE — Telephone Encounter (Signed)
 Will discuss at ov on 10/03/24

## 2024-10-03 ENCOUNTER — Ambulatory Visit: Admitting: Adult Health

## 2024-10-03 ENCOUNTER — Encounter: Payer: Self-pay | Admitting: Adult Health

## 2024-10-03 VITALS — BP 130/84 | HR 81 | Ht 62.0 in | Wt 187.2 lb

## 2024-10-03 DIAGNOSIS — R5383 Other fatigue: Secondary | ICD-10-CM

## 2024-10-03 DIAGNOSIS — G4733 Obstructive sleep apnea (adult) (pediatric): Secondary | ICD-10-CM

## 2024-10-03 NOTE — Patient Instructions (Addendum)
 Call our office back if you change your mind on oral appliance or CPAP therapy  Sleep with head of bed inclined at 30 degrees and avoid sleeping on your back.  Work on healthy weight Do not drive if sleepy Follow-up As needed
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
## 2024-10-07 ENCOUNTER — Encounter: Payer: Self-pay | Admitting: Radiology

## 2024-10-08 DIAGNOSIS — D631 Anemia in chronic kidney disease: Secondary | ICD-10-CM | POA: Diagnosis not present

## 2024-10-08 DIAGNOSIS — E211 Secondary hyperparathyroidism, not elsewhere classified: Secondary | ICD-10-CM | POA: Diagnosis not present

## 2024-10-08 DIAGNOSIS — N189 Chronic kidney disease, unspecified: Secondary | ICD-10-CM | POA: Diagnosis not present

## 2024-10-08 DIAGNOSIS — R809 Proteinuria, unspecified: Secondary | ICD-10-CM | POA: Diagnosis not present

## 2024-10-08 DIAGNOSIS — E559 Vitamin D deficiency, unspecified: Secondary | ICD-10-CM | POA: Diagnosis not present

## 2024-10-14 DIAGNOSIS — E785 Hyperlipidemia, unspecified: Secondary | ICD-10-CM | POA: Diagnosis not present

## 2024-10-14 DIAGNOSIS — E669 Obesity, unspecified: Secondary | ICD-10-CM | POA: Diagnosis not present

## 2024-10-14 DIAGNOSIS — Z881 Allergy status to other antibiotic agents status: Secondary | ICD-10-CM | POA: Diagnosis not present

## 2024-10-14 DIAGNOSIS — K5900 Constipation, unspecified: Secondary | ICD-10-CM | POA: Diagnosis not present

## 2024-10-14 DIAGNOSIS — G629 Polyneuropathy, unspecified: Secondary | ICD-10-CM | POA: Diagnosis not present

## 2024-10-14 DIAGNOSIS — Z9181 History of falling: Secondary | ICD-10-CM | POA: Diagnosis not present

## 2024-10-14 DIAGNOSIS — Z880 Allergy status to penicillin: Secondary | ICD-10-CM | POA: Diagnosis not present

## 2024-10-14 DIAGNOSIS — Z833 Family history of diabetes mellitus: Secondary | ICD-10-CM | POA: Diagnosis not present

## 2024-10-14 DIAGNOSIS — Z8249 Family history of ischemic heart disease and other diseases of the circulatory system: Secondary | ICD-10-CM | POA: Diagnosis not present

## 2024-10-16 DIAGNOSIS — N1832 Chronic kidney disease, stage 3b: Secondary | ICD-10-CM | POA: Diagnosis not present

## 2024-10-16 DIAGNOSIS — I129 Hypertensive chronic kidney disease with stage 1 through stage 4 chronic kidney disease, or unspecified chronic kidney disease: Secondary | ICD-10-CM | POA: Diagnosis not present

## 2024-10-16 DIAGNOSIS — K760 Fatty (change of) liver, not elsewhere classified: Secondary | ICD-10-CM | POA: Diagnosis not present

## 2024-10-16 DIAGNOSIS — E2740 Unspecified adrenocortical insufficiency: Secondary | ICD-10-CM | POA: Diagnosis not present

## 2024-10-23 DIAGNOSIS — M25552 Pain in left hip: Secondary | ICD-10-CM | POA: Diagnosis not present

## 2024-12-04 ENCOUNTER — Other Ambulatory Visit: Payer: Self-pay

## 2024-12-04 ENCOUNTER — Inpatient Hospital Stay (HOSPITAL_COMMUNITY)
Admission: EM | Admit: 2024-12-04 | Discharge: 2024-12-10 | DRG: 522 | Disposition: A | Discharge status: Skilled Nursing Facility | Attending: Family Medicine | Admitting: Family Medicine

## 2024-12-04 ENCOUNTER — Encounter (HOSPITAL_COMMUNITY): Payer: Self-pay

## 2024-12-04 ENCOUNTER — Emergency Department (HOSPITAL_COMMUNITY)

## 2024-12-04 DIAGNOSIS — R000 Tachycardia, unspecified: Secondary | ICD-10-CM | POA: Diagnosis not present

## 2024-12-04 DIAGNOSIS — Z981 Arthrodesis status: Secondary | ICD-10-CM

## 2024-12-04 DIAGNOSIS — Z83438 Family history of other disorder of lipoprotein metabolism and other lipidemia: Secondary | ICD-10-CM | POA: Diagnosis not present

## 2024-12-04 DIAGNOSIS — W010XXA Fall on same level from slipping, tripping and stumbling without subsequent striking against object, initial encounter: Secondary | ICD-10-CM | POA: Diagnosis present

## 2024-12-04 DIAGNOSIS — Z91040 Latex allergy status: Secondary | ICD-10-CM | POA: Diagnosis not present

## 2024-12-04 DIAGNOSIS — R030 Elevated blood-pressure reading, without diagnosis of hypertension: Secondary | ICD-10-CM | POA: Diagnosis not present

## 2024-12-04 DIAGNOSIS — E669 Obesity, unspecified: Secondary | ICD-10-CM | POA: Diagnosis present

## 2024-12-04 DIAGNOSIS — S72001A Fracture of unspecified part of neck of right femur, initial encounter for closed fracture: Principal | ICD-10-CM | POA: Diagnosis present

## 2024-12-04 DIAGNOSIS — Z8262 Family history of osteoporosis: Secondary | ICD-10-CM

## 2024-12-04 DIAGNOSIS — Z880 Allergy status to penicillin: Secondary | ICD-10-CM | POA: Diagnosis not present

## 2024-12-04 DIAGNOSIS — Z833 Family history of diabetes mellitus: Secondary | ICD-10-CM | POA: Diagnosis not present

## 2024-12-04 DIAGNOSIS — Z91048 Other nonmedicinal substance allergy status: Secondary | ICD-10-CM

## 2024-12-04 DIAGNOSIS — Z90710 Acquired absence of both cervix and uterus: Secondary | ICD-10-CM

## 2024-12-04 DIAGNOSIS — G4733 Obstructive sleep apnea (adult) (pediatric): Secondary | ICD-10-CM | POA: Diagnosis present

## 2024-12-04 DIAGNOSIS — Z825 Family history of asthma and other chronic lower respiratory diseases: Secondary | ICD-10-CM

## 2024-12-04 DIAGNOSIS — Z813 Family history of other psychoactive substance abuse and dependence: Secondary | ICD-10-CM | POA: Diagnosis not present

## 2024-12-04 DIAGNOSIS — Z881 Allergy status to other antibiotic agents status: Secondary | ICD-10-CM

## 2024-12-04 DIAGNOSIS — Z7982 Long term (current) use of aspirin: Secondary | ICD-10-CM | POA: Diagnosis not present

## 2024-12-04 DIAGNOSIS — I129 Hypertensive chronic kidney disease with stage 1 through stage 4 chronic kidney disease, or unspecified chronic kidney disease: Secondary | ICD-10-CM | POA: Diagnosis present

## 2024-12-04 DIAGNOSIS — Z818 Family history of other mental and behavioral disorders: Secondary | ICD-10-CM

## 2024-12-04 DIAGNOSIS — E782 Mixed hyperlipidemia: Secondary | ICD-10-CM | POA: Diagnosis present

## 2024-12-04 DIAGNOSIS — N1832 Chronic kidney disease, stage 3b: Secondary | ICD-10-CM | POA: Diagnosis present

## 2024-12-04 DIAGNOSIS — Z6834 Body mass index (BMI) 34.0-34.9, adult: Secondary | ICD-10-CM

## 2024-12-04 DIAGNOSIS — Z8261 Family history of arthritis: Secondary | ICD-10-CM | POA: Diagnosis not present

## 2024-12-04 DIAGNOSIS — Z822 Family history of deafness and hearing loss: Secondary | ICD-10-CM

## 2024-12-04 DIAGNOSIS — Z635 Disruption of family by separation and divorce: Secondary | ICD-10-CM | POA: Diagnosis not present

## 2024-12-04 DIAGNOSIS — K219 Gastro-esophageal reflux disease without esophagitis: Secondary | ICD-10-CM | POA: Diagnosis present

## 2024-12-04 DIAGNOSIS — Z823 Family history of stroke: Secondary | ICD-10-CM

## 2024-12-04 DIAGNOSIS — Y92008 Other place in unspecified non-institutional (private) residence as the place of occurrence of the external cause: Secondary | ICD-10-CM | POA: Diagnosis not present

## 2024-12-04 DIAGNOSIS — D72829 Elevated white blood cell count, unspecified: Secondary | ICD-10-CM | POA: Diagnosis present

## 2024-12-04 DIAGNOSIS — M25551 Pain in right hip: Secondary | ICD-10-CM | POA: Diagnosis present

## 2024-12-04 DIAGNOSIS — Z811 Family history of alcohol abuse and dependence: Secondary | ICD-10-CM

## 2024-12-04 DIAGNOSIS — Z79899 Other long term (current) drug therapy: Secondary | ICD-10-CM

## 2024-12-04 DIAGNOSIS — W19XXXA Unspecified fall, initial encounter: Secondary | ICD-10-CM

## 2024-12-04 DIAGNOSIS — Z8249 Family history of ischemic heart disease and other diseases of the circulatory system: Secondary | ICD-10-CM | POA: Diagnosis not present

## 2024-12-04 LAB — CBC WITH DIFFERENTIAL/PLATELET
Abs Immature Granulocytes: 0.06 K/uL (ref 0.00–0.07)
Basophils Absolute: 0 K/uL (ref 0.0–0.1)
Basophils Relative: 0 %
Eosinophils Absolute: 0.1 K/uL (ref 0.0–0.5)
Eosinophils Relative: 1 %
HCT: 42 % (ref 36.0–46.0)
Hemoglobin: 13.1 g/dL (ref 12.0–15.0)
Immature Granulocytes: 1 %
Lymphocytes Relative: 31 %
Lymphs Abs: 3.2 K/uL (ref 0.7–4.0)
MCH: 27.6 pg (ref 26.0–34.0)
MCHC: 31.2 g/dL (ref 30.0–36.0)
MCV: 88.6 fL (ref 80.0–100.0)
Monocytes Absolute: 0.8 K/uL (ref 0.1–1.0)
Monocytes Relative: 8 %
Neutro Abs: 6.2 K/uL (ref 1.7–7.7)
Neutrophils Relative %: 59 %
Platelets: 304 K/uL (ref 150–400)
RBC: 4.74 MIL/uL (ref 3.87–5.11)
RDW: 14.6 % (ref 11.5–15.5)
WBC: 10.3 K/uL (ref 4.0–10.5)
nRBC: 0 % (ref 0.0–0.2)

## 2024-12-04 LAB — COMPREHENSIVE METABOLIC PANEL WITH GFR
ALT: 14 U/L (ref 0–44)
AST: 18 U/L (ref 15–41)
Albumin: 4.3 g/dL (ref 3.5–5.0)
Alkaline Phosphatase: 51 U/L (ref 38–126)
Anion gap: 7 (ref 5–15)
BUN: 25 mg/dL — ABNORMAL HIGH (ref 8–23)
CO2: 32 mmol/L (ref 22–32)
Calcium: 8.8 mg/dL — ABNORMAL LOW (ref 8.9–10.3)
Chloride: 104 mmol/L (ref 98–111)
Creatinine, Ser: 1.43 mg/dL — ABNORMAL HIGH (ref 0.44–1.00)
GFR, Estimated: 39 mL/min — ABNORMAL LOW
Glucose, Bld: 93 mg/dL (ref 70–99)
Potassium: 4.6 mmol/L (ref 3.5–5.1)
Sodium: 143 mmol/L (ref 135–145)
Total Bilirubin: 0.6 mg/dL (ref 0.0–1.2)
Total Protein: 6.7 g/dL (ref 6.5–8.1)

## 2024-12-04 MED ORDER — MORPHINE SULFATE (PF) 2 MG/ML IV SOLN
2.0000 mg | Freq: Once | INTRAVENOUS | Status: AC
  Administered 2024-12-04: 2 mg via INTRAVENOUS
  Filled 2024-12-04: qty 1

## 2024-12-04 MED ORDER — MORPHINE SULFATE (PF) 4 MG/ML IV SOLN
4.0000 mg | Freq: Once | INTRAVENOUS | Status: AC
  Administered 2024-12-04: 4 mg via INTRAVENOUS
  Filled 2024-12-04: qty 1

## 2024-12-04 NOTE — ED Provider Notes (Signed)
 " [place] EMERGENCY DEPARTMENT AT [facility] Provider Note   CSN: [id number] Arrival date & time: 12/04/24  1849     Patient presents with: Kathryn Lynn is a 73 y.o. female.  Patient with past history significant for CKD, iron  deficiency anemia, carpal tunnel syndrome of the left wrist, prior back surgery presents to the emergency department with concerns of a fall.  Reports she had a mechanical fall from the ground while at home after she tripped over a rug outside.  States that she is unable to move the right leg due to severe pain in the right hip.  No history of hip replacement.  She is not on any blood thinners.  Denies any head strike or head impact.  Denies any other area of pain.  Currently rates her pain at a 6 out of 10 in the right hip.   Fall       Prior to Admission medications  Medication Sig Start Date End Date Taking? Authorizing Provider  Calcium Carb-Cholecalciferol (CALCIUM 600 + D PO) Take 1 tablet by mouth daily.   Yes [provider]  cetirizine (ZYRTEC) 10 MG tablet Take 10 mg by mouth daily.   Yes [provider]  cimetidine (TAGAMET) 200 MG tablet Take 200 mg by mouth 2 (two) times daily.   Yes [provider]  diclofenac  Sodium (VOLTAREN ) 1 % GEL Apply 2-4 g topically 4 (four) times daily. Patient taking differently: Apply 2-4 g topically 4 (four) times daily as needed. 09/12/23  Yes Kuneff, Renee A, DO  Docusate Sodium  (DSS) 100 MG CAPS Take 100 mg by mouth every other day.   Yes [provider]  FERREX 150 150 MG capsule TAKE 1 CAPSULE BY MOUTH EVERY OTHER DAY 09/04/24  Yes Kuneff, Renee A, DO  fluticasone (FLONASE) 50 MCG/ACT nasal spray Place 1 spray into both nostrils daily as needed for allergies.   Yes [provider]  gabapentin  (NEURONTIN ) 300 MG capsule Take 1 capsule (300 mg total) by mouth 3 (three) times daily. Patient taking differently: Take 300 mg by mouth 2 (two) times daily.  08/13/24  Yes Kuneff, Renee A, DO  magnesium oxide (MAG-OX) 400 (240 Mg) MG tablet Take 400 mg by mouth daily.   Yes [provider]  meclizine  (ANTIVERT ) 25 MG tablet Take 25 mg by mouth 3 (three) times daily as needed for dizziness. 09/06/21  Yes [provider]  Melatonin 10 MG TABS Take 10 mg by mouth at bedtime as needed (sleep).   Yes [provider]  Omega-3 Fatty Acids (OMEGA-3 FISH OIL) 300 MG CAPS Take 300 mg by mouth daily in the afternoon.   Yes [provider]  simvastatin  (ZOCOR ) 20 MG tablet Take 1 tablet (20 mg total) by mouth at bedtime. 08/13/24  Yes Kuneff, Renee A, DO  Vitamin D , Cholecalciferol, 25 MCG (1000 UT) CAPS Take 1,000 Units by mouth daily.   Yes [provider]  vitamin E 180 MG (400 UNITS) capsule Take 400 Units by mouth daily.   Yes [provider]    Allergies: Adhesive [tape], Cephalosporins, Clindamycin, Latex, and Penicillins    Review of Systems  Musculoskeletal:        Hip pain  All other systems reviewed and are negative.   Updated Vital Signs BP (!) 141/76   Pulse 85   Temp 97.9 F (36.6 C) (Oral)   Resp 15   Ht 5' 2 (1.575 m)  Wt 84.9 kg   SpO2 95%   BMI 34.23 kg/m   Physical Exam Vitals and nursing note reviewed.  Constitutional:      General: She is not in acute distress.    Appearance: She is well-developed.  HENT:     Head: Normocephalic and atraumatic.  Eyes:     Conjunctiva/sclera: Conjunctivae normal.  Cardiovascular:     Rate and Rhythm: Normal rate and regular rhythm.     Heart sounds: No murmur heard. Pulmonary:     Effort: Pulmonary effort is normal. No respiratory distress.     Breath sounds: Normal breath sounds.  Abdominal:     Palpations: Abdomen is soft.     Tenderness: There is no abdominal tenderness.  Musculoskeletal:        General: Tenderness, deformity and signs of injury present. No swelling.     Cervical back: Neck supple.     Comments: Right leg  about 2 inches shortened and held in internal rotation.  Skin:    General: Skin is warm and dry.     Capillary Refill: Capillary refill takes less than 2 seconds.  Neurological:     Mental Status: She is alert.  Psychiatric:        Mood and Affect: Mood normal.     (all labs ordered are listed, but only abnormal results are displayed) Labs Reviewed  COMPREHENSIVE METABOLIC PANEL WITH GFR - Abnormal; Notable for the following components:      Result Value   BUN 25 (*)    Creatinine, Ser 1.43 (*)    Calcium 8.8 (*)    GFR, Estimated 39 (*)    All other components within normal limits  CBC WITH DIFFERENTIAL/PLATELET    EKG: None  Radiology: DG Hip Unilat W or Wo Pelvis 2-3 Views Right Result Date: 12/04/2024 EXAM: 2 or more VIEW(S) XRAY OF THE RIGHT HIP 12/04/2024 07:23:00 PM COMPARISON: None available. CLINICAL HISTORY: fall injury FINDINGS: BONES AND JOINTS: Right femoral neck fracture with varus deformity. LUMBAR SPINE: Posterior lumbar decompression, bilateral laminectomy, bilateral pedicle screw and rod fixation of the visualized lumbar spine noted. SOFT TISSUES: The soft tissues are unremarkable. IMPRESSION: 1. Right femoral neck fracture with varus deformity. Electronically signed by: Greig Pique MD 12/04/2024 08:35 PM EST RP Workstation: [id number]     Procedures   Medications Ordered in the ED  morphine (PF) 2 MG/ML injection 2 mg (has no administration in time range)  morphine (PF) 4 MG/ML injection 4 mg (4 mg Intravenous Given 12/04/24 1959)                                    Medical Decision Making Amount and/or Complexity of Data Reviewed Labs: ordered. Radiology: ordered.  Risk Prescription drug management. Decision regarding hospitalization.   This patient presents to the ED for concern of fall, hip pain, this involves an extensive number of treatment options, and is a complaint that carries with it a high risk of complications and morbidity.  The  differential diagnosis includes intertrochanteric fracture, hip dislocation, femoral fracture, hip pain   Co morbidities that complicate the patient evaluation  CKD, iron  deficiency anemia, carpal tunnel syndrome, prior back surgery   Additional history obtained:  Additional history obtained from chart review   Lab Tests:  I Ordered, and personally interpreted labs.  The pertinent results include: CBC unremarkable, CMP with baseline renal dysfunction   Imaging Studies ordered:  I ordered imaging studies including x-ray of the pelvis and right hip I independently visualized and interpreted imaging which showed right femoral neck fracture with varus deformity I agree with the radiologist interpretation   Cardiac Monitoring: / EKG:  The patient was maintained on a cardiac monitor.  I personally viewed and interpreted the cardiac monitored which showed an underlying rhythm of: Sinus rhythm   Consultations Obtained:  I requested consultation with the orthopedics, hospitalist and discussed lab and imaging findings as well as pertinent plan - they recommend: Spoke with Dr. Margrette, orthopedics, who advised n.p.o. at midnight and management by orthopedics in the morning.  Medicine admission.  Spoke with Dr. Shona, hospitalist, who will be admitting patient.   Problem List / ED Course / Critical interventions / Medication management  Patient presents to the emergency department with concerns of a fall.  Reportedly had mechanical fall at home when she tripped over a mat that was outside and landed on the right hip.  Dors is pain of right hip.  She says she has been unable to bear weight since the fall.  She is on blood thinners.  Denies any other area of impact or pain. On exam, patient has an internally rotated right leg with obvious shortening.  There is notable tenderness to the right hip.  No bruising seen over this area. Suspect likely fracture.  X-ray will be ordered to evaluate  for hip fracture.  Will obtain EKG and basic labs due to high suspicion for femoral/hip fracture. Right hip shows an acute right femoral neck fracture.  Consult orthopedics placed. Spoke with Dr. Margrette, orthopedics, who recommends medicine admission, n.p.o. at midnight, and manage in the morning. Spoke with Dr. Shona, hospitalist, who admitting patient. I ordered medication including morphine for pain Reevaluation of the patient after these medicines showed that the patient improved I have reviewed the patients home medicines and have made adjustments as needed   Social Determinants of Health:  None   Test / Admission - Considered:  Admission required  Final diagnoses:  Closed displaced fracture of right femoral neck [facility])  Fall, initial encounter    ED Discharge Orders     None          Cecily Legrand LABOR, PA-C 12/04/24 2221  "

## 2024-12-04 NOTE — ED Triage Notes (Signed)
 Pt arrived via [facility] from home c/o right hip pain from a fall at home. Pt reports chronic back pain and reports she tripped over a rug causing her to fall. No obvious shortening or rotation of RLE noted in Triage.

## 2024-12-04 NOTE — Consult Note (Signed)
" °  Ortho note  Surgery will be scheduled for Friday 12 pm    "

## 2024-12-05 ENCOUNTER — Inpatient Hospital Stay (HOSPITAL_COMMUNITY)

## 2024-12-05 ENCOUNTER — Encounter (HOSPITAL_COMMUNITY): Payer: Self-pay | Admitting: Internal Medicine

## 2024-12-05 ENCOUNTER — Encounter (HOSPITAL_COMMUNITY)
Admission: EM | Disposition: A | Discharge status: Skilled Nursing Facility | Payer: Self-pay | Source: Home / Self Care | Attending: Family Medicine

## 2024-12-05 ENCOUNTER — Other Ambulatory Visit: Payer: Self-pay

## 2024-12-05 DIAGNOSIS — S72001A Fracture of unspecified part of neck of right femur, initial encounter for closed fracture: Secondary | ICD-10-CM | POA: Diagnosis not present

## 2024-12-05 HISTORY — PX: HIP ARTHROPLASTY: SHX981

## 2024-12-05 LAB — BASIC METABOLIC PANEL WITH GFR
Anion gap: 9 (ref 5–15)
BUN: 19 mg/dL (ref 8–23)
CO2: 27 mmol/L (ref 22–32)
Calcium: 8.5 mg/dL — ABNORMAL LOW (ref 8.9–10.3)
Chloride: 103 mmol/L (ref 98–111)
Creatinine, Ser: 1.08 mg/dL — ABNORMAL HIGH (ref 0.44–1.00)
GFR, Estimated: 54 mL/min — ABNORMAL LOW
Glucose, Bld: 109 mg/dL — ABNORMAL HIGH (ref 70–99)
Potassium: 4.5 mmol/L (ref 3.5–5.1)
Sodium: 138 mmol/L (ref 135–145)

## 2024-12-05 LAB — CBC
HCT: 41.2 % (ref 36.0–46.0)
Hemoglobin: 12.8 g/dL (ref 12.0–15.0)
MCH: 27.5 pg (ref 26.0–34.0)
MCHC: 31.1 g/dL (ref 30.0–36.0)
MCV: 88.6 fL (ref 80.0–100.0)
Platelets: 247 K/uL (ref 150–400)
RBC: 4.65 MIL/uL (ref 3.87–5.11)
RDW: 14.6 % (ref 11.5–15.5)
WBC: 8 K/uL (ref 4.0–10.5)
nRBC: 0 % (ref 0.0–0.2)

## 2024-12-05 LAB — SURGICAL PCR SCREEN
MRSA, PCR: NEGATIVE
Staphylococcus aureus: POSITIVE — AB

## 2024-12-05 LAB — PHOSPHORUS: Phosphorus: 3.1 mg/dL (ref 2.5–4.6)

## 2024-12-05 LAB — MAGNESIUM: Magnesium: 2.7 mg/dL — ABNORMAL HIGH (ref 1.7–2.4)

## 2024-12-05 SURGERY — HEMIARTHROPLASTY (BIPOLAR) HIP, POSTERIOR APPROACH FOR FRACTURE
Anesthesia: Spinal | Site: Hip | Laterality: Right

## 2024-12-05 MED ORDER — OXYCODONE HCL 5 MG PO TABS
10.0000 mg | ORAL_TABLET | ORAL | Status: DC | PRN
  Administered 2024-12-05 – 2024-12-08 (×5): 15 mg via ORAL
  Administered 2024-12-10: 10 mg via ORAL
  Filled 2024-12-05 (×5): qty 3

## 2024-12-05 MED ORDER — PROCHLORPERAZINE EDISYLATE 10 MG/2ML IJ SOLN
5.0000 mg | Freq: Four times a day (QID) | INTRAMUSCULAR | Status: DC | PRN
  Administered 2024-12-05: 5 mg via INTRAVENOUS
  Filled 2024-12-05: qty 2

## 2024-12-05 MED ORDER — ACETAMINOPHEN 500 MG PO TABS
500.0000 mg | ORAL_TABLET | Freq: Four times a day (QID) | ORAL | Status: AC | PRN
  Administered 2024-12-06 – 2024-12-09 (×4): 500 mg via ORAL
  Filled 2024-12-05 (×4): qty 1

## 2024-12-05 MED ORDER — EPHEDRINE SULFATE-NACL 50-0.9 MG/10ML-% IV SOSY
PREFILLED_SYRINGE | INTRAVENOUS | Status: DC | PRN
  Administered 2024-12-05: 5 mg via INTRAVENOUS

## 2024-12-05 MED ORDER — CEFAZOLIN SODIUM-DEXTROSE 2-3 GM-%(50ML) IV SOLR
INTRAVENOUS | Status: DC | PRN
  Administered 2024-12-05: 2 g via INTRAVENOUS

## 2024-12-05 MED ORDER — OXYCODONE HCL 5 MG PO TABS
5.0000 mg | ORAL_TABLET | ORAL | Status: DC | PRN
  Filled 2024-12-05: qty 2

## 2024-12-05 MED ORDER — TRANEXAMIC ACID-NACL 1000-0.7 MG/100ML-% IV SOLN
1000.0000 mg | Freq: Once | INTRAVENOUS | Status: AC
  Administered 2024-12-05: 1000 mg via INTRAVENOUS
  Filled 2024-12-05: qty 100

## 2024-12-05 MED ORDER — FENTANYL CITRATE (PF) 100 MCG/2ML IJ SOLN
INTRAMUSCULAR | Status: AC
  Filled 2024-12-05: qty 2

## 2024-12-05 MED ORDER — VANCOMYCIN HCL IN DEXTROSE 1-5 GM/200ML-% IV SOLN: 1000.0000 mg | Freq: Two times a day (BID) | INTRAVENOUS | Status: DC

## 2024-12-05 MED ORDER — TRAMADOL HCL 50 MG PO TABS
50.0000 mg | ORAL_TABLET | Freq: Four times a day (QID) | ORAL | Status: DC
  Administered 2024-12-05 – 2024-12-06 (×4): 50 mg via ORAL
  Filled 2024-12-05 (×4): qty 1

## 2024-12-05 MED ORDER — FENTANYL CITRATE (PF) 100 MCG/2ML IJ SOLN
INTRAMUSCULAR | Status: DC | PRN
  Administered 2024-12-05: 100 ug via INTRAVENOUS
  Administered 2024-12-05 (×2): 50 ug via INTRAVENOUS

## 2024-12-05 MED ORDER — CHLORHEXIDINE GLUCONATE 4 % EX SOLN: 60.0000 mL | Freq: Once | CUTANEOUS | Status: DC

## 2024-12-05 MED ORDER — SODIUM CHLORIDE 0.9 % IV SOLN: INTRAVENOUS | Status: DC

## 2024-12-05 MED ORDER — CHLORHEXIDINE GLUCONATE 0.12 % MT SOLN: 15.0000 mL | Freq: Once | OROMUCOSAL | Status: DC

## 2024-12-05 MED ORDER — 0.9 % SODIUM CHLORIDE (POUR BTL) OPTIME
TOPICAL | Status: DC | PRN
  Administered 2024-12-05: 1000 mL

## 2024-12-05 MED ORDER — KETAMINE HCL 50 MG/5ML IJ SOSY
PREFILLED_SYRINGE | INTRAMUSCULAR | Status: AC
  Filled 2024-12-05: qty 5

## 2024-12-05 MED ORDER — ACETAMINOPHEN 325 MG PO TABS
325.0000 mg | ORAL_TABLET | Freq: Four times a day (QID) | ORAL | Status: DC | PRN
  Administered 2024-12-09: 325 mg via ORAL
  Administered 2024-12-10: 650 mg via ORAL
  Filled 2024-12-05 (×2): qty 2

## 2024-12-05 MED ORDER — TRANEXAMIC ACID-NACL 1000-0.7 MG/100ML-% IV SOLN
1000.0000 mg | INTRAVENOUS | Status: AC
  Administered 2024-12-05: 1000 mg via INTRAVENOUS

## 2024-12-05 MED ORDER — MELATONIN 5 MG PO TABS
5.0000 mg | ORAL_TABLET | Freq: Every evening | ORAL | Status: DC | PRN
  Filled 2024-12-05: qty 1

## 2024-12-05 MED ORDER — HYDROMORPHONE HCL 1 MG/ML IJ SOLN
0.5000 mg | INTRAMUSCULAR | Status: DC | PRN
  Administered 2024-12-05: 0.5 mg via INTRAVENOUS
  Filled 2024-12-05 (×2): qty 0.5

## 2024-12-05 MED ORDER — POVIDONE-IODINE 10 % EX SWAB: 2.0000 | Freq: Once | CUTANEOUS | Status: DC

## 2024-12-05 MED ORDER — OXYCODONE HCL 5 MG PO TABS: 5.0000 mg | ORAL_TABLET | ORAL | Status: DC | PRN

## 2024-12-05 MED ORDER — STERILE WATER FOR IRRIGATION IR SOLN
Status: DC | PRN
  Administered 2024-12-05: 1000 mL

## 2024-12-05 MED ORDER — METOCLOPRAMIDE HCL 10 MG PO TABS: 5.0000 mg | ORAL_TABLET | Freq: Three times a day (TID) | ORAL | Status: DC | PRN

## 2024-12-05 MED ORDER — BUPIVACAINE-EPINEPHRINE (PF) 0.5% -1:200000 IJ SOLN
INTRAMUSCULAR | Status: AC
  Filled 2024-12-05: qty 30

## 2024-12-05 MED ORDER — MIDAZOLAM HCL (PF) 2 MG/2ML IJ SOLN
INTRAMUSCULAR | Status: DC | PRN
  Administered 2024-12-05: 2 mg via INTRAVENOUS

## 2024-12-05 MED ORDER — MUPIROCIN 2 % EX OINT: 1.0000 | TOPICAL_OINTMENT | Freq: Two times a day (BID) | CUTANEOUS | 0 refills | Status: DC

## 2024-12-05 MED ORDER — MIDAZOLAM HCL 2 MG/2ML IJ SOLN
INTRAMUSCULAR | Status: AC
  Filled 2024-12-05: qty 2

## 2024-12-05 MED ORDER — METOCLOPRAMIDE HCL 5 MG/ML IJ SOLN: 5.0000 mg | Freq: Three times a day (TID) | INTRAMUSCULAR | Status: DC | PRN

## 2024-12-05 MED ORDER — HYDROMORPHONE HCL 1 MG/ML IJ SOLN
0.5000 mg | INTRAMUSCULAR | Status: DC | PRN
  Administered 2024-12-09 – 2024-12-10 (×2): 1 mg via INTRAVENOUS
  Filled 2024-12-05 (×2): qty 1

## 2024-12-05 MED ORDER — BUPIVACAINE-EPINEPHRINE (PF) 0.5% -1:200000 IJ SOLN
INTRAMUSCULAR | Status: DC | PRN
  Administered 2024-12-05: 30 mL via PERINEURAL

## 2024-12-05 MED ORDER — CHLORHEXIDINE GLUCONATE CLOTH 2 % EX PADS
6.0000 | MEDICATED_PAD | Freq: Every day | CUTANEOUS | Status: DC
  Administered 2024-12-05: 6 via TOPICAL

## 2024-12-05 MED ORDER — HYDROMORPHONE HCL 1 MG/ML IJ SOLN: 0.2500 mg | INTRAMUSCULAR | Status: DC | PRN

## 2024-12-05 MED ORDER — VANCOMYCIN HCL IN DEXTROSE 1-5 GM/200ML-% IV SOLN
1000.0000 mg | Freq: Two times a day (BID) | INTRAVENOUS | Status: AC
  Administered 2024-12-05: 1000 mg via INTRAVENOUS
  Filled 2024-12-05: qty 200

## 2024-12-05 MED ORDER — ENOXAPARIN SODIUM 40 MG/0.4ML IJ SOSY
40.0000 mg | PREFILLED_SYRINGE | INTRAMUSCULAR | Status: DC
  Administered 2024-12-06 – 2024-12-10 (×5): 40 mg via SUBCUTANEOUS
  Filled 2024-12-05 (×5): qty 0.4

## 2024-12-05 MED ORDER — LACTATED RINGERS IV SOLN: INTRAVENOUS | Status: DC

## 2024-12-05 MED ORDER — CHLORHEXIDINE GLUCONATE 4 % EX SOLN: 1.0000 | CUTANEOUS | 1 refills | Status: DC

## 2024-12-05 MED ORDER — KETAMINE HCL 10 MG/ML IJ SOLN
INTRAMUSCULAR | Status: DC | PRN
  Administered 2024-12-05: 50 mg via INTRAVENOUS

## 2024-12-05 MED ORDER — POLYETHYLENE GLYCOL 3350 17 G PO PACK: 17.0000 g | PACK | Freq: Every day | ORAL | Status: DC | PRN

## 2024-12-05 MED ORDER — SIMVASTATIN 20 MG PO TABS
20.0000 mg | ORAL_TABLET | Freq: Every day | ORAL | Status: DC
  Administered 2024-12-05 – 2024-12-09 (×5): 20 mg via ORAL
  Filled 2024-12-05 (×5): qty 1

## 2024-12-05 MED ORDER — CEFAZOLIN SODIUM-DEXTROSE 2-4 GM/100ML-% IV SOLN: 2.0000 g | INTRAVENOUS | Status: DC

## 2024-12-05 MED ORDER — OXYCODONE HCL 5 MG/5ML PO SOLN: 5.0000 mg | Freq: Once | ORAL | Status: DC | PRN

## 2024-12-05 MED ORDER — CEFAZOLIN SODIUM-DEXTROSE 2-4 GM/100ML-% IV SOLN: 2.0000 g | Freq: Once | INTRAVENOUS | Status: DC

## 2024-12-05 MED ORDER — PHENYLEPHRINE HCL (PRESSORS) 10 MG/ML IV SOLN
INTRAVENOUS | Status: DC | PRN
  Administered 2024-12-05 (×3): 160 ug via INTRAVENOUS

## 2024-12-05 MED ORDER — OXYCODONE HCL 5 MG PO TABS: 5.0000 mg | ORAL_TABLET | Freq: Once | ORAL | Status: DC | PRN

## 2024-12-05 MED ORDER — SODIUM CHLORIDE 0.9 % IR SOLN
Status: DC | PRN
  Administered 2024-12-05: 3000 mL

## 2024-12-05 MED ORDER — ORAL CARE MOUTH RINSE: 15.0000 mL | Freq: Once | OROMUCOSAL | Status: DC

## 2024-12-05 MED ORDER — DOCUSATE SODIUM 100 MG PO CAPS
100.0000 mg | ORAL_CAPSULE | Freq: Two times a day (BID) | ORAL | Status: DC
  Administered 2024-12-05 – 2024-12-10 (×10): 100 mg via ORAL
  Filled 2024-12-05 (×10): qty 1

## 2024-12-05 MED ORDER — VANCOMYCIN HCL IN DEXTROSE 1-5 GM/200ML-% IV SOLN: 1000.0000 mg | INTRAVENOUS | Status: DC

## 2024-12-05 MED ORDER — MUPIROCIN 2 % EX OINT
1.0000 | TOPICAL_OINTMENT | Freq: Two times a day (BID) | CUTANEOUS | Status: AC
  Administered 2024-12-05 – 2024-12-10 (×10): 1 via NASAL
  Filled 2024-12-05: qty 22

## 2024-12-05 SURGICAL SUPPLY — 44 items
BALL HIP ARTICU 28 +5 (Hips) IMPLANT
BENZOIN TINCTURE PRP APPL 2/3 (GAUZE/BANDAGES/DRESSINGS) IMPLANT
BIT DRILL 2.8X128 (BIT) ×1 IMPLANT
BLADE SAGITTAL 25.0X1.27X90 (BLADE) ×1 IMPLANT
CHLORAPREP W/TINT 26 (MISCELLANEOUS) ×1 IMPLANT
COUNTER NEEDLE MAGNETIC 40 RED (SET/KITS/TRAYS/PACK) ×1 IMPLANT
COVER LIGHT HANDLE STERIS (MISCELLANEOUS) ×2 IMPLANT
DRAPE HIP W/POCKET STRL (MISCELLANEOUS) ×1 IMPLANT
DRAPE U-SHAPE 47X51 STRL (DRAPES) ×1 IMPLANT
DRESSING AQUACEL AG ADV 3.5X12 (MISCELLANEOUS) ×1 IMPLANT
DRSG MEPILEX SACRM 8.7X9.8 (GAUZE/BANDAGES/DRESSINGS) ×1 IMPLANT
ELECTRODE REM PT RTRN 9FT ADLT (ELECTROSURGICAL) ×1 IMPLANT
GLOVE BIOGEL PI IND STRL 7.0 (GLOVE) ×2 IMPLANT
GLOVE BIOGEL PI IND STRL 8.5 (GLOVE) ×1 IMPLANT
GLOVE SKINSENSE STRL SZ8.0 LF (GLOVE) ×2 IMPLANT
GOWN STRL REUS W/TWL LRG LVL3 (GOWN DISPOSABLE) ×2 IMPLANT
GOWN STRL REUS W/TWL XL LVL3 (GOWN DISPOSABLE) ×1 IMPLANT
HEAD BIPOLAR PROS AML 45 (Hips) IMPLANT
INST SET MAJOR BONE (KITS) ×1 IMPLANT
KIT TURNOVER KIT A (KITS) ×1 IMPLANT
MANIFOLD NEPTUNE II (INSTRUMENTS) ×1 IMPLANT
MARKER SKIN DUAL TIP RULER LAB (MISCELLANEOUS) ×1 IMPLANT
NDL MAYO 1/2 CRC TROCAR PT (NEEDLE) IMPLANT
NEEDLE HYPO 21X1.5 SAFETY (NEEDLE) ×1 IMPLANT
PACK TOTAL JOINT (CUSTOM PROCEDURE TRAY) ×1 IMPLANT
PAD ARMBOARD POSITIONER FOAM (MISCELLANEOUS) ×1 IMPLANT
POSITIONER HEAD 8X9X4 ADT (SOFTGOODS) ×1 IMPLANT
SET BASIN LINEN APH (SET/KITS/TRAYS/PACK) ×1 IMPLANT
SET HNDPC FAN SPRY TIP SCT (DISPOSABLE) ×1 IMPLANT
SOLN 0.9% NACL POUR BTL 1000ML (IV SOLUTION) ×1 IMPLANT
SOLN STERILE WATER BTL 1000 ML (IV SOLUTION) ×2 IMPLANT
SPONGE T-LAP 18X18 ~~LOC~~+RFID (SPONGE) IMPLANT
STEM FEM SZ3 STD ACTIS (Stem) IMPLANT
STRIP CLOSURE SKIN 1/2X4 (GAUZE/BANDAGES/DRESSINGS) IMPLANT
SUT BRALON NAB BRD #1 30IN (SUTURE) ×2 IMPLANT
SUT ETHIBOND 5 LR DA (SUTURE) ×2 IMPLANT
SUT MNCRL 0 VIOLET CTX 36 (SUTURE) ×1 IMPLANT
SUT MON AB 0 CT1 (SUTURE) ×1 IMPLANT
SUT MON AB 2-0 CT1 36 (SUTURE) IMPLANT
SUT VIC AB 1 CT1 27XBRD ANTBC (SUTURE) ×4 IMPLANT
SYR 30ML LL (SYRINGE) ×1 IMPLANT
SYR BULB IRRIG 60ML STRL (SYRINGE) ×1 IMPLANT
TRAY FOLEY MTR SLVR 16FR STAT (SET/KITS/TRAYS/PACK) ×1 IMPLANT
YANKAUER SUCT 12FT TUBE ARGYLE (SUCTIONS) ×1 IMPLANT

## 2024-12-05 NOTE — Progress Notes (Signed)
" °  Transition of Care [facility]) Screening Note   Patient Details  Name: Kathryn Lynn Date of Birth: 07-16-1951   Transition of Care [facility]) CM/SW Contact:    Hoy DELENA Bigness, LCSW Phone Number: 12/05/2024, 8:55 AM    Transition of Care Department [facility]) has reviewed patient and no TOC needs have been identified at this time. We will continue to monitor patient advancement through interdisciplinary progression rounds. If new patient transition needs arise, please place a TOC consult.    12/05/24 0855  TOC Brief Assessment  Insurance and Status Reviewed  Patient has primary care physician Yes  Home environment has been reviewed From home w/ spouse  Prior level of function: Independent  Prior/Current Home Services No current home services  Social Drivers of Health Review SDOH reviewed no interventions necessary  Readmission risk has been reviewed Yes  Transition of care needs transition of care needs identified, TOC will continue to follow    "
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
## 2024-12-05 NOTE — Brief Op Note (Signed)
 Orthopaedic Surgery Operative Note (CSN: [id number])  Kathryn Lynn  28-Feb-1951 Date of Surgery: 12/05/2024   Diagnoses:  right femoral neck fracture   Procedure: Right hip hemiarthroplasty   TXA yes used Operative Finding Clearly displaced femoral neck fracture without degenerative arthritis   Post-Op Diagnosis: Same Surgeons:Primary: Margrette Taft FORBES, MD Assistants: Levon Constant Location: [facility] OR ROOM 4 Anesthesia: Spinal Antibiotics: Vancomycin  secondary to penicillin allergy with rash  Estimated Blood Loss: 100 cc Complications: None Specimens: None   Implants: Implant Name Type Inv. Item Serial No. Manufacturer Lot No. LRB No. Used Action  HEAD BIPOLAR PROS AML 45 - [id number] Hips HEAD BIPOLAR PROS AML 45  [employer]PU[employer] [id number] Right 1 Implanted  STEM FEM SZ3 STD ACTIS - [id number] Stem STEM FEM SZ3 STD ACTIS  DEPUY ORTHOPA[employer]ICS [id number] Right 1 Implanted  BALL HIP ARTICU 28 +5 - [id number] Hips BALL HIP ARTICU 28 +5  DEPUY ORTHOPA[employer] [id number] Right 1 Implanted     Indication for surgery right femoral neck hip fracture  Transexamic  acid was given yes    Details of surgery:  Direct lateral approach   The patient was identified by 2 approved identification mechanisms. The operative extremity was evaluated and found to be acceptable for surgical treatment today. The chart was reviewed. The surgical site was confirmed initials were placed over the right hip  The patient was taken to the operating room and given Comycin as the antibiotic due to documented penicillin allergy the patient was given the following anesthetic: Spinal  Foley catheter insertion was completed  The patient was then placed in the lateral decubitus position with appropriate padding. The surgical site was prepped and draped sterilely.  Timeout was executed confirming the patient's name, surgical site, antibiotic administration, x-rays available, and implants were checked and were  available.  Incision was made over the greater trochanter extended proximally and distally approximately 3 to 4 cm the subcutaneous tissue was divided down to the fascial layer which was split in line with the skin incision  The greater trochanteric bursa was minimal but was excised  The abductor musculature was identified and the anterior half was subperiosteally peeled from the greater trochanter with the leg in external rotation leaving a cuff of tissue for later repair.  The capsule was split and preserved using an H-shaped cut.  A acetabular wing retractor was placed.  2 Hohmann retractors were placed around the femoral neck and the head was excised.  The head measured 45 mm in diameter  The acetabulum was irrigated and inspected there was no evidence of arthritis.  The leg was dislocated anteriorly into a sterile bag.  Capsule was excised down to the lesser trochanter  Proximal femoral preparation: A cutting guide was used to make a femoral cut aiming for 5 to 6 mm above the lesser trochanter.  A box osteotome was used to enter the canal followed by a starter reamer hole followed by trochanteric reamer followed by serial broaching up to a size 3 which gave a nice tight fit.  Proximal planer was placed over the broach.  2 reductions were performed first with a 1.5 neck and then with a 5 mm neck as the first 1.5 neck gave too much shuck into loose of a hip  With the 5 mm neck in place and the 45 head in place we got a good shuck test leg length restoration sleep test was normal external rotation was excellent in extension knee flexed well past 120 degrees; flexion internal  rotation was 90/45.  The hip was dislocated the broach was removed 2 drill holes were placed in the trochanter and a #5 suture was passed.  The implant was placed along with the head neck and the reduction was repeated.  We did another trial reduction with the real implants in place and were able to confirm stability in  all planes with restoration of leg length  The capsule was preserved and therefore was repaired with #1 Vicryl suture this was followed by repair of the abductors with a #5 suture followed by oversewing of the abductor mechanism with #1 Surgilon.  Vicryl was used to close the muscular division.  The muscle and capsule were injected with Marcaine  30 cc  The hip was abducted on a Mayo stand with padding and closed with #1 Surgilon  The subcutaneous fat was closed with 0 Monocryl in running fashion  The subcuticular tissue was closed with 2-0 Monocryl in running fashion and the skin was reapproximated with Steri-Strips  A sterile dressing was applied  The patient was taken recovery room in stable condition   Postop plan  Weightbearing as tolerated Direct lateral hip precautions DVT prophylaxis for 30 days Postop appointment scheduled for 28 days  [id number] 12/05/2024  3:04 PM  PATIENT:  Kathryn Lynn  74 y.o. female  PRE-OPERATIVE DIAGNOSIS:  right femoral neck fracture  POST-OPERATIVE DIAGNOSIS:  right femoral neck fracture  PROCEDURE:  Procedures: HEMIARTHROPLASTY (BIPOLAR) HIP (Right)  SURGEON:  Surgeons and Role:    DEWAINE Margrette Taft FORBES, MD - Primary  PHYSICIAN ASSISTANT:   ASSISTANTS: NICKI HARLEY   ANESTHESIA:   spinal  EBL:  50 mL   BLOOD ADMINISTERED:none  DRAINS: none   LOCAL MEDICATIONS USED:  MARCAINE      SPECIMEN:  No Specimen  DISPOSITION OF SPECIMEN:  N/A  COUNTS:  YES  TOURNIQUET:  * No tourniquets in log *  DICTATION: .Dragon Dictation  PLAN OF CARE: Admit to inpatient   PATIENT DISPOSITION:  PACU - hemodynamically stable.   Delay start of Pharmacological VTE agent (>24hrs) due to surgical blood loss or risk of bleeding: yes
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
## 2024-12-05 NOTE — Anesthesia Procedure Notes (Signed)
 Spinal  Patient location during procedure: OR Reason for block: surgical anesthesia  Staffing Authorized by: Herschell Hollering, MD   Performed by: Herschell Hollering, MD  Preanesthetic Checklist Completed: patient identified, IV checked, site marked, risks and benefits discussed, surgical consent, monitors and equipment checked, pre-op evaluation and timeout performed Spinal Block Patient position: sitting Prep: DuraPrep Patient monitoring: heart rate, cardiac monitor, continuous pulse ox and blood pressure Approach: midline Location: L3-4 Injection technique: single-shot Needle Needle type: Sprotte  Needle gauge: 24 G Needle length: 9 cm Assessment Sensory level: T4 Events: CSF return

## 2024-12-05 NOTE — Transfer of Care (Signed)
 Immediate Anesthesia Transfer of Care Note  Patient: Kathryn Lynn  Procedure(s) Performed: HEMIARTHROPLASTY (BIPOLAR) HIP (Right: Hip)  Patient Location: PACU  Anesthesia Type:Spinal  Level of Consciousness: awake and alert   Airway & Oxygen Therapy: Patient Spontanous Breathing  Post-op Assessment: Report given to RN and Post -op Vital signs reviewed and stable  Post vital signs: Reviewed and stable  Last Vitals:  Vitals Value Taken Time  BP 139/66 12/05/24 15:04  Temp 36.4 C 12/05/24 15:04  Pulse 117 12/05/24 15:09  Resp 16 12/05/24 15:09  SpO2 99 % 12/05/24 15:09  Vitals shown include unfiled device data.  Last Pain:  Vitals:   12/05/24 0929  TempSrc:   PainSc: 2          Complications: No notable events documented.

## 2024-12-05 NOTE — Anesthesia Preprocedure Evaluation (Signed)
"                                    Anesthesia Evaluation  Patient identified by MRN, date of birth, ID band Patient awake    Reviewed: Allergy & Precautions, H&P , NPO status , Patient's Chart, lab work & pertinent test results  History of Anesthesia Complications (+) PONV and history of anesthetic complications  Airway Mallampati: III  TM Distance: >3 FB Neck ROM: Full    Dental no notable dental hx.    Pulmonary neg pulmonary ROS   Pulmonary exam normal breath sounds clear to auscultation       Cardiovascular hypertension, negative cardio ROS Normal cardiovascular exam Rhythm:Regular Rate:Normal     Neuro/Psych negative neurological ROS  negative psych ROS   GI/Hepatic Neg liver ROS,GERD  ,,  Endo/Other  negative endocrine ROS    Renal/GU Renal InsufficiencyRenal disease3b  negative genitourinary   Musculoskeletal  (+) Arthritis ,    Abdominal   Peds negative pediatric ROS (+)  Hematology  (+) Blood dyscrasia, anemia   Anesthesia Other Findings   Reproductive/Obstetrics negative OB ROS                              Anesthesia Physical Anesthesia Plan  ASA: 3 and emergent  Anesthesia Plan: Spinal   Post-op Pain Management:    Induction:   PONV Risk Score and Plan:   Airway Management Planned: Nasal Cannula  Additional Equipment:   Intra-op Plan:   Post-operative Plan:   Informed Consent: I have reviewed the patients History and Physical, chart, labs and discussed the procedure including the risks, benefits and alternatives for the proposed anesthesia with the patient or authorized representative who has indicated his/her understanding and acceptance.     Dental advisory given  Plan Discussed with: Surgeon  Anesthesia Plan Comments:         Anesthesia Quick Evaluation  "

## 2024-12-05 NOTE — Progress Notes (Signed)
 ASSUMPTION OF CARE NOTE   12/05/2024 10:06 AM  Kathryn Lynn was seen and examined.  The H&P by the admitting provider, orders, imaging was reviewed.  Please see new orders.  Will continue to follow.   Vitals:   12/05/24 0212 12/05/24 0553  BP: (!) 146/66 133/68  Pulse: 92 87  Resp: 16   Temp: 98.2 F (36.8 C) 98.3 F (36.8 C)  SpO2: 96% 96%    Results for orders placed or performed during the hospital encounter of 12/04/24  CBC with Differential   Collection Time: 12/04/24  7:59 PM  Result Value Ref Range   WBC 10.3 4.0 - 10.5 K/uL   RBC 4.74 3.87 - 5.11 MIL/uL   Hemoglobin 13.1 12.0 - 15.0 g/dL   HCT 57.9 63.9 - 53.9 %   MCV 88.6 80.0 - 100.0 fL   MCH 27.6 26.0 - 34.0 pg   MCHC 31.2 30.0 - 36.0 g/dL   RDW 85.3 88.4 - 84.4 %   Platelets 304 150 - 400 K/uL   nRBC 0.0 0.0 - 0.2 %   Neutrophils Relative % 59 %   Neutro Abs 6.2 1.7 - 7.7 K/uL   Lymphocytes Relative 31 %   Lymphs Abs 3.2 0.7 - 4.0 K/uL   Monocytes Relative 8 %   Monocytes Absolute 0.8 0.1 - 1.0 K/uL   Eosinophils Relative 1 %   Eosinophils Absolute 0.1 0.0 - 0.5 K/uL   Basophils Relative 0 %   Basophils Absolute 0.0 0.0 - 0.1 K/uL   Immature Granulocytes 1 %   Abs Immature Granulocytes 0.06 0.00 - 0.07 K/uL  Comprehensive metabolic panel   Collection Time: 12/04/24  7:59 PM  Result Value Ref Range   Sodium 143 135 - 145 mmol/L   Potassium 4.6 3.5 - 5.1 mmol/L   Chloride 104 98 - 111 mmol/L   CO2 32 22 - 32 mmol/L   Glucose, Bld 93 70 - 99 mg/dL   BUN 25 (H) 8 - 23 mg/dL   Creatinine, Ser 8.56 (H) 0.44 - 1.00 mg/dL   Calcium 8.8 (L) 8.9 - 10.3 mg/dL   Total Protein 6.7 6.5 - 8.1 g/dL   Albumin 4.3 3.5 - 5.0 g/dL   AST 18 15 - 41 U/L   ALT 14 0 - 44 U/L   Alkaline Phosphatase 51 38 - 126 U/L   Total Bilirubin 0.6 0.0 - 1.2 mg/dL   GFR, Estimated 39 (L) >60 mL/min   Anion gap 7 5 - 15  CBC   Collection Time: 12/05/24  5:29 AM  Result Value Ref Range   WBC 8.0 4.0 - 10.5 K/uL   RBC 4.65 3.87 -  5.11 MIL/uL   Hemoglobin 12.8 12.0 - 15.0 g/dL   HCT 58.7 63.9 - 53.9 %   MCV 88.6 80.0 - 100.0 fL   MCH 27.5 26.0 - 34.0 pg   MCHC 31.1 30.0 - 36.0 g/dL   RDW 85.3 88.4 - 84.4 %   Platelets 247 150 - 400 K/uL   nRBC 0.0 0.0 - 0.2 %  Basic metabolic panel   Collection Time: 12/05/24  5:29 AM  Result Value Ref Range   Sodium 138 135 - 145 mmol/L   Potassium 4.5 3.5 - 5.1 mmol/L   Chloride 103 98 - 111 mmol/L   CO2 27 22 - 32 mmol/L   Glucose, Bld 109 (H) 70 - 99 mg/dL   BUN 19 8 - 23 mg/dL   Creatinine, Ser 8.91 (H)  0.44 - 1.00 mg/dL   Calcium 8.5 (L) 8.9 - 10.3 mg/dL   GFR, Estimated 54 (L) >60 mL/min   Anion gap 9 5 - 15  Magnesium   Collection Time: 12/05/24  5:29 AM  Result Value Ref Range   Magnesium 2.7 (H) 1.7 - 2.4 mg/dL  Phosphorus   Collection Time: 12/05/24  5:29 AM  Result Value Ref Range   Phosphorus 3.1 2.5 - 4.6 mg/dL     KYM Louder, MD [facility]   12/04/2024  6:55 PM How to contact the [facility] Attending or Consulting provider 7A - 7P or covering provider during after hours 7P -7A, for this patient?  Check the care team in [facility] and look for a) attending/consulting [facility] provider listed and b) the [facility] team listed Log into [email or web address] and use [place]'s universal password to access. If you do not have the password, please contact the hospital operator. Locate the [facility] provider you are looking for under [facility] and page to a number that you can be directly reached. If you still have difficulty reaching the provider, please page the [facility] (Director on Call) for the Hospitalists listed on amion for assistance.

## 2024-12-05 NOTE — Plan of Care (Signed)
  Problem: Education: Goal: Knowledge of General Education information will improve Description: Including pain rating scale, medication(s)/side effects and non-pharmacologic comfort measures Outcome: Progressing   Problem: Clinical Measurements: Goal: Ability to maintain clinical measurements within normal limits will improve Outcome: Progressing   Problem: Activity: Goal: Risk for activity intolerance will decrease Outcome: Progressing   Problem: Pain Managment: Goal: General experience of comfort will improve and/or be controlled Outcome: Progressing   Problem: Safety: Goal: Ability to remain free from injury will improve Outcome: Progressing   Problem: Skin Integrity: Goal: Risk for impaired skin integrity will decrease Outcome: Progressing

## 2024-12-05 NOTE — Interval H&P Note (Signed)
 History and Physical Interval Note:  12/05/2024 12:36 PM  Kathryn Lynn  has presented today for surgery, with the diagnosis of right femoral neck fracture.  The various methods of treatment have been discussed with the patient and family. After consideration of risks, benefits and other options for treatment, the patient has consented to  Procedures with comments: HEMIARTHROPLASTY (BIPOLAR) HIP (Right) - CRNFA NEEDED, NICKI AVAILABALE as a surgical intervention.  The patient's history has been reviewed, patient examined, no change in status, stable for surgery.  I have reviewed the patient's chart and labs.  Questions were answered to the patient's satisfaction.     [name]

## 2024-12-06 ENCOUNTER — Encounter (HOSPITAL_COMMUNITY): Payer: Self-pay | Admitting: Orthopedic Surgery

## 2024-12-06 DIAGNOSIS — R030 Elevated blood-pressure reading, without diagnosis of hypertension: Secondary | ICD-10-CM

## 2024-12-06 DIAGNOSIS — M25551 Pain in right hip: Secondary | ICD-10-CM | POA: Diagnosis not present

## 2024-12-06 DIAGNOSIS — R000 Tachycardia, unspecified: Secondary | ICD-10-CM

## 2024-12-06 DIAGNOSIS — G4733 Obstructive sleep apnea (adult) (pediatric): Secondary | ICD-10-CM | POA: Diagnosis not present

## 2024-12-06 DIAGNOSIS — S72001A Fracture of unspecified part of neck of right femur, initial encounter for closed fracture: Secondary | ICD-10-CM | POA: Diagnosis not present

## 2024-12-06 DIAGNOSIS — N1832 Chronic kidney disease, stage 3b: Secondary | ICD-10-CM | POA: Diagnosis not present

## 2024-12-06 LAB — MAGNESIUM: Magnesium: 1.7 mg/dL (ref 1.7–2.4)

## 2024-12-06 LAB — CBC
HCT: 37.4 % (ref 36.0–46.0)
Hemoglobin: 12 g/dL (ref 12.0–15.0)
MCH: 28.1 pg (ref 26.0–34.0)
MCHC: 32.1 g/dL (ref 30.0–36.0)
MCV: 87.6 fL (ref 80.0–100.0)
Platelets: 208 K/uL (ref 150–400)
RBC: 4.27 MIL/uL (ref 3.87–5.11)
RDW: 14.6 % (ref 11.5–15.5)
WBC: 10.9 K/uL — ABNORMAL HIGH (ref 4.0–10.5)
nRBC: 0 % (ref 0.0–0.2)

## 2024-12-06 LAB — BASIC METABOLIC PANEL WITH GFR
Anion gap: 9 (ref 5–15)
BUN: 10 mg/dL (ref 8–23)
CO2: 25 mmol/L (ref 22–32)
Calcium: 8 mg/dL — ABNORMAL LOW (ref 8.9–10.3)
Chloride: 100 mmol/L (ref 98–111)
Creatinine, Ser: 0.91 mg/dL (ref 0.44–1.00)
GFR, Estimated: >60 mL/min
Glucose, Bld: 120 mg/dL — ABNORMAL HIGH (ref 70–99)
Potassium: 4.4 mmol/L (ref 3.5–5.1)
Sodium: 134 mmol/L — ABNORMAL LOW (ref 135–145)

## 2024-12-06 MED ORDER — POLYSACCHARIDE IRON COMPLEX 150 MG PO CAPS
150.0000 mg | ORAL_CAPSULE | Freq: Every day | ORAL | Status: DC
  Administered 2024-12-06 – 2024-12-10 (×5): 150 mg via ORAL
  Filled 2024-12-06 (×5): qty 1

## 2024-12-06 MED ORDER — OMEGA-3-ACID ETHYL ESTERS 1 G PO CAPS
1.0000 g | ORAL_CAPSULE | Freq: Two times a day (BID) | ORAL | Status: DC
  Administered 2024-12-06 – 2024-12-10 (×9): 1 g via ORAL
  Filled 2024-12-06 (×9): qty 1

## 2024-12-06 MED ORDER — GABAPENTIN 300 MG PO CAPS
300.0000 mg | ORAL_CAPSULE | Freq: Two times a day (BID) | ORAL | Status: DC
  Administered 2024-12-06 – 2024-12-10 (×9): 300 mg via ORAL
  Filled 2024-12-06 (×9): qty 1

## 2024-12-06 MED ORDER — FLUTICASONE PROPIONATE 50 MCG/ACT NA SUSP: 1.0000 | Freq: Every day | NASAL | Status: DC | PRN

## 2024-12-06 MED ORDER — MELATONIN 3 MG PO TABS
3.0000 mg | ORAL_TABLET | Freq: Every evening | ORAL | Status: DC | PRN
  Administered 2024-12-07 – 2024-12-10 (×3): 3 mg via ORAL
  Filled 2024-12-06 (×3): qty 1

## 2024-12-06 MED ORDER — VITAMIN D 25 MCG (1000 UNIT) PO TABS
1000.0000 [IU] | ORAL_TABLET | Freq: Every day | ORAL | Status: DC
  Administered 2024-12-06 – 2024-12-10 (×5): 1000 [IU] via ORAL
  Filled 2024-12-06 (×5): qty 1

## 2024-12-06 MED ORDER — MAGNESIUM OXIDE -MG SUPPLEMENT 400 (240 MG) MG PO TABS
400.0000 mg | ORAL_TABLET | Freq: Every day | ORAL | Status: DC
  Administered 2024-12-06 – 2024-12-07 (×2): 400 mg via ORAL
  Filled 2024-12-06 (×2): qty 1

## 2024-12-06 MED ORDER — MECLIZINE HCL 12.5 MG PO TABS: 25.0000 mg | ORAL_TABLET | Freq: Three times a day (TID) | ORAL | Status: DC | PRN

## 2024-12-06 MED ORDER — METOPROLOL TARTRATE 5 MG/5ML IV SOLN
2.5000 mg | Freq: Four times a day (QID) | INTRAVENOUS | Status: DC
  Administered 2024-12-06: 2.5 mg via INTRAVENOUS
  Filled 2024-12-06: qty 5

## 2024-12-06 MED ORDER — MAGNESIUM SULFATE 2 GM/50ML IV SOLN
2.0000 g | Freq: Once | INTRAVENOUS | Status: AC
  Administered 2024-12-06: 2 g via INTRAVENOUS
  Filled 2024-12-06: qty 50

## 2024-12-06 MED ORDER — OMEGA-3 FISH OIL 300 MG PO CAPS: 300.0000 mg | ORAL_CAPSULE | Freq: Every day | ORAL | Status: DC

## 2024-12-06 MED ORDER — LACTATED RINGERS IV BOLUS
1000.0000 mL | Freq: Once | INTRAVENOUS | Status: AC
  Administered 2024-12-06: 1000 mL via INTRAVENOUS

## 2024-12-06 NOTE — Progress Notes (Signed)
 Patient had good urine output throughout this shift. Foley removed at 0600.

## 2024-12-06 NOTE — Progress Notes (Signed)
 Patient pain has been able to be managed by PO meds. No IV pain meds were given this shift.

## 2024-12-06 NOTE — Evaluation (Signed)
 Physical Therapy Evaluation Patient Details Name: Kathryn Lynn MRN: [id number] DOB: 1950/12/09 Today's Date: 12/06/2024  History of Present Illness  Kathryn Lynn is a 74 y.o. female s/p Right hip hemiarthroplasty on 12/07/23 with medical history significant for OSA on CPAP, obesity, hyperlipidemia, CKD 3B, previous back surgery, who presented to the ER after a mechanical fall.  The patient reports she tripped over a rug causing her to fall.  She was in her usual state of health prior to this.  Endorses severe pain in her right hip.  EMS was activated.  The patient was brought into the ER for further evaluation.     In the ER, vital signs are stable.  Right hip x-ray revealed right femoral neck fracture with varus deformity.       The patient received IV opiate analgesics.   Clinical Impression  Patient demonstrates slow labored movement for sitting up at bedside with most difficulty moving RLE due to increasing pain, limited to a few slow labored side steps due to fatigue and tolerated sitting up in chair after therapy. Patient will benefit from continued skilled physical therapy in hospital and recommended venue below to increase strength, balance, endurance for safe ADLs and gait.          If plan is discharge home, recommend the following: A lot of help with bathing/dressing/bathroom;A lot of help with walking and/or transfers;Help with stairs or ramp for entrance;Assist for transportation;Assistance with cooking/housework   Can travel by private vehicle   No    Equipment Recommendations None recommended by PT  Recommendations for Other Services       Functional Status Assessment Patient has had a recent decline in their functional status and demonstrates the ability to make significant improvements in function in a reasonable and predictable amount of time.     Precautions / Restrictions Precautions Precautions: Fall Recall of Precautions/Restrictions: Intact Restrictions Weight  Bearing Restrictions Per Provider Order: Yes RLE Weight Bearing Per Provider Order: Weight bearing as tolerated      Mobility  Bed Mobility Overal bed mobility: Needs Assistance Bed Mobility: Sit to Supine       Sit to supine: Max assist, HOB elevated   General bed mobility comments: slow labored movement requiring frequent rest breaks due to fatigue and fatigue, and required HOB raised    Transfers Overall transfer level: Needs assistance Equipment used: Rolling walker (2 wheels) Transfers: Sit to/from Stand, Bed to chair/wheelchair/BSC Sit to Stand: Mod assist   Step pivot transfers: Mod assist       General transfer comment: unsteady labored movement with limited weightbearing on RLE due to pain    Ambulation/Gait Ambulation/Gait assistance: Mod assist, Max assist Gait Distance (Feet): 5 Feet Assistive device: Rolling walker (2 wheels) Gait Pattern/deviations: Decreased step length - left, Decreased stance time - right, Decreased stride length, Antalgic, Decreased step length - right, Trunk flexed Gait velocity: slow     General Gait Details: limited to a few slow labored side steps before having to sit due to increasing right hip pain/weakness  Stairs            Wheelchair Mobility     Tilt Bed    Modified Rankin (Stroke Patients Only)       Balance Overall balance assessment: Needs assistance Sitting-balance support: Feet supported, No upper extremity supported Sitting balance-Leahy Scale: Fair Sitting balance - Comments: seated at EOB   Standing balance support: Reliant on assistive device for balance, During functional activity, Bilateral upper  extremity supported Standing balance-Leahy Scale: Poor Standing balance comment: using RW                             Pertinent Vitals/Pain Pain Assessment Pain Assessment: Faces Faces Pain Scale: Hurts even more Pain Location: right hip with movement Pain Descriptors / Indicators:  Discomfort, Grimacing, Sharp, Moaning, Guarding Pain Intervention(s): Limited activity within patient's tolerance, Monitored during session, Repositioned    Home Living Family/patient expects to be discharged to:: Private residence Living Arrangements: Children Available Help at Discharge: Family;Available PRN/intermittently Type of Home: House Home Access: Stairs to enter Entrance Stairs-Rails: Right;Left;Can reach both Entrance Stairs-Number of Steps: 2   Home Layout: One level Home Equipment: Agricultural Consultant (2 wheels);Cane - single point;Shower seat      Prior Function Prior Level of Function : Independent/Modified Independent;Driving             Mobility Comments: Community ambulation without AD, drives ADLs Comments: Independent     Extremity/Trunk Assessment   Upper Extremity Assessment Upper Extremity Assessment: Generalized weakness    Lower Extremity Assessment Lower Extremity Assessment: Generalized weakness;RLE deficits/detail RLE Deficits / Details: grossly 3+/5 RLE: Unable to fully assess due to pain RLE Sensation: WNL RLE Coordination: WNL    Cervical / Trunk Assessment Cervical / Trunk Assessment: Kyphotic  Communication   Communication Communication: No apparent difficulties    Cognition Arousal: Alert Behavior During Therapy: WFL for tasks assessed/performed                             Following commands: Intact       Cueing Cueing Techniques: Verbal cues, Tactile cues     General Comments      Exercises     Assessment/Plan    PT Assessment Patient needs continued PT services  PT Problem List Decreased strength;Decreased activity tolerance;Decreased balance;Decreased mobility;Pain       PT Treatment Interventions DME instruction;Gait training;Stair training;Functional mobility training;Therapeutic activities;Therapeutic exercise;Balance training;Patient/family education    PT Goals (Current goals can be found in the  Care Plan section)  Acute Rehab PT Goals Patient Stated Goal: return home after rehab PT Goal Formulation: With patient Time For Goal Achievement: 12/20/24 Potential to Achieve Goals: Good    Frequency Min 4X/week     Co-evaluation               AM-PAC PT 6 Clicks Mobility  Outcome Measure Help needed turning from your back to your side while in a flat bed without using bedrails?: A Lot Help needed moving from lying on your back to sitting on the side of a flat bed without using bedrails?: A Lot Help needed moving to and from a bed to a chair (including a wheelchair)?: A Lot Help needed standing up from a chair using your arms (e.g., wheelchair or bedside chair)?: A Lot Help needed to walk in hospital room?: A Lot Help needed climbing 3-5 steps with a railing? : Total 6 Click Score: 11    End of Session   Activity Tolerance: Patient tolerated treatment well;Patient limited by fatigue;Patient limited by pain Patient left: in chair;with call bell/phone within reach Nurse Communication: Mobility status PT Visit Diagnosis: Unsteadiness on feet (R26.81);Other abnormalities of gait and mobility (R26.89);Muscle weakness (generalized) (M62.81)    Time: 9082-9051 PT Time Calculation (min) (ACUTE ONLY): 31 min   Charges:   PT Evaluation $PT Eval Moderate Complexity: 1  Mod PT Treatments $Therapeutic Activity: 23-37 mins PT General Charges $$ ACUTE PT VISIT: 1 Visit         2:47 PM, 12/06/2024 Lynwood Music, MPT Physical Therapist with [facility] [phone] office [phone] mobile phone

## 2024-12-06 NOTE — Anesthesia Postprocedure Evaluation (Signed)
"   Anesthesia Post Note  Patient: Kathryn Lynn  Procedure(s) Performed: HEMIARTHROPLASTY (BIPOLAR) HIP (Right: Hip)  Patient location during evaluation: PACU Anesthesia Type: Spinal Level of consciousness: oriented and awake and alert Pain management: pain level controlled Vital Signs Assessment: post-procedure vital signs reviewed and stable Respiratory status: spontaneous breathing, respiratory function stable and patient connected to nasal cannula oxygen Cardiovascular status: blood pressure returned to baseline and stable Postop Assessment: no headache, no backache and no apparent nausea or vomiting Anesthetic complications: no   No notable events documented.   Last Vitals:  Vitals:   12/06/24 0503 12/06/24 0612  BP: (!) 142/68 (!) 147/74  Pulse: (!) 115   Resp:    Temp: 36.9 C   SpO2: 92%     Last Pain:  Vitals:   12/06/24 0612  TempSrc:   PainSc: 3                  Andrea Limes      "

## 2024-12-06 NOTE — Progress Notes (Signed)
 " PROGRESS NOTE   Kathryn Lynn  FMW:[id number] DOB: January 29, 1951 DOA: 12/04/2024 PCP: Catherine Charlies LABOR, DO   Chief Complaint  Patient presents with   Fall   Level of care: Telemetry  Brief Admission History:  74 y.o. female with medical history significant for OSA on CPAP, obesity, hyperlipidemia, CKD 3B, previous back surgery, who presented to the ER after a mechanical fall.  The patient reports she tripped over a rug causing her to fall.  She was in her usual state of health prior to this.  Endorses severe pain in her right hip.  EMS was activated.  The patient was brought into the ER for further evaluation.   In the ER, vital signs are stable.  Right hip x-ray revealed right femoral neck fracture with varus deformity.     EDP discussed the case with orthopedic surgery.  Recommended admission by medicine team.  She was taken to OR on 12/05/24.  Admitted by [facility], hospitalist service.   Assessment and Plan:  Closed displaced fracture right femoral neck post mechanical fall Pain control as needed Postop s/p right hip hemiarthroplasty 12/05/2024 with Dr. Margrette Continue postop care PT recommending SNF rehab placement    CKD 3B Creatinine improved with hydration to 0.91 Gentle IV fluid hydration completed  Monitoring urine output Avoid hepatotoxic agents, dehydration, and hypotension Repeat BMP in the morning.   Hyperlipidemia Resume home simvastatin   Elevated BPs and tachycardia -- starting lopressor IV 2.5 mg every 6 hours with hold parameters   OSA Resume home CPAP   Obesity BMI 34 Recommend weight loss outpatient regular physical activity and healthy dieting.  DVT prophylaxis: SCDs Code Status: Full  Family Communication:  Disposition: SNF for short term rehab   Consultants:  ortho  Procedures:  Right hip hemiarthroplasty 12/05/24  Antimicrobials:    Subjective: Pt reporting that she is not having a lot of pain and the oral tramadol has been helping.    Objective: Vitals:   12/06/24 0201 12/06/24 0503 12/06/24 0612 12/06/24 0739  BP: (!) 147/66 (!) 142/68 (!) 147/74 (!) 150/67  Pulse: (!) 106 (!) 115  (!) 117  Resp:      Temp:  98.5 F (36.9 C)  98.1 F (36.7 C)  TempSrc:  Oral  Oral  SpO2:  92%  92%  Weight:      Height:        Intake/Output Summary (Last 24 hours) at 12/06/2024 1209 Last data filed at 12/06/2024 0853 Gross per 24 hour  Intake 2114.44 ml  Output 3075 ml  Net -960.56 ml   Filed Weights   12/04/24 1857 12/04/24 2239  Weight: 84.9 kg 85.5 kg   Examination:  General exam: Appears calm and comfortable  Respiratory system: Clear to auscultation. Respiratory effort normal. Cardiovascular system: tachycardic, normal S1 & S2 heard. No JVD, murmurs, rubs, gallops or clicks. No pedal edema. Gastrointestinal system: Abdomen is nondistended, soft and nontender. No organomegaly or masses felt. Normal bowel sounds heard. Central nervous system: Alert and oriented. No focal neurological deficits. Extremities: bandages clean dry intact. Skin: No rashes, lesions or ulcers. Psychiatry: Judgement and insight appear normal. Mood & affect appropriate.   Data Reviewed: I have personally reviewed following labs and imaging studies  CBC: Recent Labs  Lab 12/04/24 1959 12/05/24 0529 12/06/24 0444  WBC 10.3 8.0 10.9*  NEUTROABS 6.2  --   --   HGB 13.1 12.8 12.0  HCT 42.0 41.2 37.4  MCV 88.6 88.6 87.6  PLT 304 247  208    Basic Metabolic Panel: Recent Labs  Lab 12/04/24 1959 12/05/24 0529 12/06/24 0444  NA 143 138 134*  K 4.6 4.5 4.4  CL 104 103 100  CO2 32 27 25  GLUCOSE 93 109* 120*  BUN 25* 19 10  CREATININE 1.43* 1.08* 0.91  CALCIUM 8.8* 8.5* 8.0*  MG  --  2.7* 1.7  PHOS  --  3.1  --     CBG: No results for input(s): GLUCAP in the last 168 hours.  Recent Results (from the past 240 hours)  Surgical pcr screen     Status: Abnormal   Collection Time: 12/05/24 10:37 AM   Specimen: Nasal Mucosa;  Nasal Swab  Result Value Ref Range Status   MRSA, PCR NEGATIVE NEGATIVE Final   Staphylococcus aureus POSITIVE (A) NEGATIVE Final    Comment: RESULT CALLED TO, READ BACK BY AND VERIFIED WITH: CASSIDY GANOE @ 1231 ON 12/05/24 C VARNER (NOTE) The Xpert SA Assay (FDA approved for NASAL specimens in patients 11 years of age and older), is one component of a comprehensive surveillance program. It is not intended to diagnose infection nor to guide or monitor treatment. Performed at [facility], [address], [place], [place] [address]      Radiology Studies: DG HIP UNILAT WITH PELVIS 2-3 VIEWS RIGHT Result Date: 12/05/2024 CLINICAL DATA:  Status post hip replacement. EXAM: DG HIP (WITH OR WITHOUT PELVIS) 2-3V RIGHT COMPARISON:  Preoperative imaging FINDINGS: Right hip hemiarthroplasty in expected alignment. No periprosthetic lucency or fracture. Recent postsurgical change includes air and edema in the soft tissues. Lumbo iliac fusion hardware is partially included in the field of view. IMPRESSION: Right hip hemiarthroplasty without immediate postoperative complication. Electronically Signed   By: Andrea Gasman M.D.   On: 12/05/2024 17:34   DG Hip Unilat W or Wo Pelvis 2-3 Views Right Result Date: 12/04/2024 EXAM: 2 or more VIEW(S) XRAY OF THE RIGHT HIP 12/04/2024 07:23:00 PM COMPARISON: None available. CLINICAL HISTORY: fall injury FINDINGS: BONES AND JOINTS: Right femoral neck fracture with varus deformity. LUMBAR SPINE: Posterior lumbar decompression, bilateral laminectomy, bilateral pedicle screw and rod fixation of the visualized lumbar spine noted. SOFT TISSUES: The soft tissues are unremarkable. IMPRESSION: 1. Right femoral neck fracture with varus deformity. Electronically signed by: Greig Pique MD 12/04/2024 08:35 PM EST RP Workstation: [id number]    Scheduled Meds:  cholecalciferol  1,000 Units Oral Daily   docusate sodium   100 mg Oral BID   enoxaparin (LOVENOX) injection  40 mg  Subcutaneous Q24H   gabapentin   300 mg Oral BID   iron  polysaccharides  150 mg Oral Daily   magnesium oxide  400 mg Oral Daily   mupirocin ointment  1 Application Nasal BID   omega-3 acid ethyl esters  1 g Oral BID   simvastatin   20 mg Oral QHS   traMADol  50 mg Oral Q6H   Continuous Infusions:   LOS: 2 days   Time spent: 55 mins  Noraa Pickeral Vicci, MD How to contact the [facility] Attending or Consulting provider 7A - 7P or covering provider during after hours 7P -7A, for this patient?  Check the care team in [facility] and look for a) attending/consulting TR[facility] provider listed and b) the TRH team listed Log into [email or web address] to find provider on call.  Locate the TR[facility] provider you are looking for under [facility] and page to a number that you can be directly reached. If you still have difficulty reaching the provider, please page the  DOC (Director on Call) for the Hospitalists listed on amion for assistance.  12/06/2024, 12:09 PM    "

## 2024-12-06 NOTE — NC FL2 (Signed)
 " [place]  MEDICAID FL2 LEVEL OF CARE FORM     IDENTIFICATION  Patient Name: Kathryn Lynn Birthdate: 12-Jan-1951 Sex: female Admission Date (Current Location): 12/04/2024  [facility] and [place] Number:  [employer] and Address:  [facility],  [address], [place] [address]      Provider Number: [id number]  Attending Physician Name and Address:  Vicci Afton CROME, MD  Relative Name and Phone Number:  Delayza, Lungren, Emergency Contact  [phone]    Current Level of Care: Hospital Recommended Level of Care: Skilled Nursing Facility Prior Approval Number:    Date Approved/Denied:   PASRR Number: [id number]  Discharge Plan: SNF    Current Diagnoses: Patient Active Problem List   Diagnosis Date Noted   Closed displaced fracture of right femoral neck (HCC) 12/04/2024   Lumbar spinal stenosis 11/09/2022   Hypocortisolemia 01/28/2022   Impingement syndrome of left shoulder region 08/02/2021   Carpal tunnel syndrome of left wrist 08/02/2021   Pain in joint of left shoulder 07/15/2021   IDA (iron  deficiency anemia) 06/30/2021   Arthritis of left knee 11/20/2020   Lumbar radiculopathy 05/21/2020   E66.9 (BMI 30-39.9) 11/21/2019   Nocturia 11/21/2019   Osteopenia 11/21/2019   Chronic kidney disease, stage 3b (HCC) 11/21/2019   Frequency of micturition 11/18/2019   Foraminal stenosis due to intervertebral disc disease 2013   Mixed hyperlipidemia 05/03/2011    Orientation RESPIRATION BLADDER Height & Weight     Self, Time, Situation, Place  Normal Incontinent Weight: 188 lb 7.9 oz (85.5 kg) Height:  5' 2.01 (157.5 cm)  BEHAVIORAL SYMPTOMS/MOOD NEUROLOGICAL BOWEL NUTRITION STATUS      Continent Diet (regular)  AMBULATORY STATUS COMMUNICATION OF NEEDS Skin   Extensive Assist Verbally Surgical wounds                       Personal Care Assistance Level of Assistance  Bathing, Feeding, Dressing Bathing Assistance: Limited  assistance Feeding assistance: Independent Dressing Assistance: Limited assistance     Functional Limitations Info  Sight, Hearing, Speech Sight Info: Impaired (eyeglasses) Hearing Info: Impaired Speech Info: Adequate    SPECIAL CARE FACTORS FREQUENCY  PT (By licensed PT), OT (By licensed OT)     PT Frequency: 5x/wk OT Frequency: 5x/wk            Contractures Contractures Info: Not present    Additional Factors Info  Code Status, Allergies Code Status Info: FULL Allergies Info: Adhesive (Tape), Cephalosporins, Clindamycin, Latex, Penicillins           Current Medications (12/06/2024):  This is the current hospital active medication list Current Facility-Administered Medications  Medication Dose Route Frequency Provider Last Rate Last Admin   acetaminophen  (TYLENOL ) tablet 325-650 mg  325-650 mg Oral Q6H PRN Margrette Taft FORBES, MD       acetaminophen  (TYLENOL ) tablet 500 mg  500 mg Oral Q6H PRN Harrison, Stanley E, MD       cholecalciferol (VITAMIN D3) 25 MCG (1000 UNIT) tablet 1,000 Units  1,000 Units Oral Daily Johnson, Clanford L, MD   1,000 Units at 12/06/24 0855   docusate sodium  (COLACE) capsule 100 mg  100 mg Oral BID Margrette Taft FORBES, MD   100 mg at 12/06/24 0855   enoxaparin (LOVENOX) injection 40 mg  40 mg Subcutaneous Q24H Margrette Taft FORBES, MD   40 mg at 12/06/24 0856   fluticasone (FLONASE) 50 MCG/ACT nasal spray 1 spray  1 spray Each  Nare Daily PRN Vicci, Clanford L, MD       gabapentin  (NEURONTIN ) capsule 300 mg  300 mg Oral BID Vicci, Clanford L, MD   300 mg at 12/06/24 0855   HYDROmorphone  (DILAUDID ) injection 0.5-1 mg  0.5-1 mg Intravenous Q4H PRN Margrette Taft BRAVO, MD       iron  polysaccharides (NIFEREX) capsule 150 mg  150 mg Oral Daily Johnson, Clanford L, MD   150 mg at 12/06/24 0855   magnesium oxide (MAG-OX) tablet 400 mg  400 mg Oral Daily Johnson, Clanford L, MD   400 mg at 12/06/24 9144   meclizine  (ANTIVERT ) tablet 25 mg  25 mg Oral  TID PRN Vicci, Clanford L, MD       melatonin tablet 3 mg  3 mg Oral QHS PRN Jesus America, NP       metoCLOPramide (REGLAN) tablet 5-10 mg  5-10 mg Oral Q8H PRN Harrison, Stanley E, MD       Or   metoCLOPramide (REGLAN) injection 5-10 mg  5-10 mg Intravenous Q8H PRN Harrison, Stanley E, MD       mupirocin ointment (BACTROBAN) 2 % 1 Application  1 Application Nasal BID Vicci, Clanford L, MD   1 Application at 12/06/24 0856   omega-3 acid ethyl esters (LOVAZA) capsule 1 g  1 g Oral BID Carolee Rosaline DASEN, RPH   1 g at 12/06/24 9144   oxyCODONE  (Oxy IR/ROXICODONE ) immediate release tablet 10-15 mg  10-15 mg Oral Q4H PRN Harrison, Stanley E, MD   15 mg at 12/06/24 1015   oxyCODONE  (Oxy IR/ROXICODONE ) immediate release tablet 5-10 mg  5-10 mg Oral Q4H PRN Harrison, Stanley E, MD       polyethylene glycol (MIRALAX  / GLYCOLAX ) packet 17 g  17 g Oral Daily PRN Harrison, Stanley E, MD       prochlorperazine (COMPAZINE) injection 5 mg  5 mg Intravenous Q6H PRN Harrison, Stanley E, MD   5 mg at 12/05/24 1104   simvastatin  (ZOCOR ) tablet 20 mg  20 mg Oral QHS Harrison, Stanley E, MD   20 mg at 12/05/24 2137   traMADol (ULTRAM) tablet 50 mg  50 mg Oral Q6H Harrison, Stanley E, MD   50 mg at 12/06/24 9387     Discharge Medications: Please see discharge summary for a list of discharge medications.  Relevant Imaging Results:  Relevant Lab Results:   Additional Information SSN [id number]  Hoy DELENA Bigness, LCSW     "

## 2024-12-06 NOTE — Op Note (Signed)
 Orthopaedic Surgery Operative Note (CSN: [id number])  [name]  28-Feb-1951 Date of Surgery: 12/05/2024   Diagnoses:  right femoral neck fracture   Procedure: Right hip hemiarthroplasty   TXA yes used Operative Finding Clearly displaced femoral neck fracture without degenerative arthritis   Post-Op Diagnosis: Same Surgeons:Primary: Margrette Taft FORBES, MD Assistants: Levon Constant Location: [facility] OR ROOM 4 Anesthesia: Spinal Antibiotics: Vancomycin  secondary to penicillin allergy with rash  Estimated Blood Loss: 100 cc Complications: None Specimens: None   Implants: Implant Name Type Inv. Item Serial No. Manufacturer Lot No. LRB No. Used Action  HEAD BIPOLAR PROS AML 45 - [id number] Hips HEAD BIPOLAR PROS AML 45  DEPU[employer] [id number] Right 1 Implanted  STEM FEM SZ3 STD ACTIS - [id number] Stem STEM FEM SZ3 STD ACTIS  DEPUY ORTHOPAEDICS [id number] Right 1 Implanted  BALL HIP ARTICU 28 +5 - [id number] Hips BALL HIP ARTICU 28 +5  DEPUY ORTHOPAEDICS [id number] Right 1 Implanted     Indication for surgery right femoral neck hip fracture  Transexamic  acid was given yes    Details of surgery:  Direct lateral approach   The patient was identified by 2 approved identification mechanisms. The operative extremity was evaluated and found to be acceptable for surgical treatment today. The chart was reviewed. The surgical site was confirmed initials were placed over the right hip  The patient was taken to the operating room and given Comycin as the antibiotic due to documented penicillin allergy the patient was given the following anesthetic: Spinal  Foley catheter insertion was completed  The patient was then placed in the lateral decubitus position with appropriate padding. The surgical site was prepped and draped sterilely.  Timeout was executed confirming the patient's name, surgical site, antibiotic administration, x-rays available, and implants were checked and were  available.  Incision was made over the greater trochanter extended proximally and distally approximately 3 to 4 cm the subcutaneous tissue was divided down to the fascial layer which was split in line with the skin incision  The greater trochanteric bursa was minimal but was excised  The abductor musculature was identified and the anterior half was subperiosteally peeled from the greater trochanter with the leg in external rotation leaving a cuff of tissue for later repair.  The capsule was split and preserved using an H-shaped cut.  A acetabular wing retractor was placed.  2 Hohmann retractors were placed around the femoral neck and the head was excised.  The head measured 45 mm in diameter  The acetabulum was irrigated and inspected there was no evidence of arthritis.  The leg was dislocated anteriorly into a sterile bag.  Capsule was excised down to the lesser trochanter  Proximal femoral preparation: A cutting guide was used to make a femoral cut aiming for 5 to 6 mm above the lesser trochanter.  A box osteotome was used to enter the canal followed by a starter reamer hole followed by trochanteric reamer followed by serial broaching up to a size 3 which gave a nice tight fit.  Proximal planer was placed over the broach.  2 reductions were performed first with a 1.5 neck and then with a 5 mm neck as the first 1.5 neck gave too much shuck into loose of a hip  With the 5 mm neck in place and the 45 head in place we got a good shuck test leg length restoration sleep test was normal external rotation was excellent in extension knee flexed well past 120 degrees; flexion internal  rotation was 90/45.  The hip was dislocated the broach was removed 2 drill holes were placed in the trochanter and a #5 suture was passed.  The implant was placed along with the head neck and the reduction was repeated.  We did another trial reduction with the real implants in place and were able to confirm stability in  all planes with restoration of leg length  The capsule was preserved and therefore was repaired with #1 Vicryl suture this was followed by repair of the abductors with a #5 suture followed by oversewing of the abductor mechanism with #1 Surgilon.  Vicryl was used to close the muscular division.  The muscle and capsule were injected with Marcaine  30 cc  The hip was abducted on a Mayo stand with padding and closed with #1 Surgilon  The subcutaneous fat was closed with 0 Monocryl in running fashion  The subcuticular tissue was closed with 2-0 Monocryl in running fashion and the skin was reapproximated with Steri-Strips  A sterile dressing was applied  The patient was taken recovery room in stable condition   Postop plan  Weightbearing as tolerated Direct lateral hip precautions DVT prophylaxis for 30 days Postop appointment scheduled for 28 days  [id number] 12/05/2024  3:04 PM  PATIENT:  Kathryn Lynn  74 y.o. female  PRE-OPERATIVE DIAGNOSIS:  right femoral neck fracture  POST-OPERATIVE DIAGNOSIS:  right femoral neck fracture  PROCEDURE:  Procedures: HEMIARTHROPLASTY (BIPOLAR) HIP (Right)  SURGEON:  Surgeons and Role:    DEWAINE Margrette Taft FORBES, MD - Primary  PHYSICIAN ASSISTANT:   ASSISTANTS: NICKI HARLEY   ANESTHESIA:   spinal  EBL:  50 mL   BLOOD ADMINISTERED:none  DRAINS: none   LOCAL MEDICATIONS USED:  MARCAINE      SPECIMEN:  No Specimen  DISPOSITION OF SPECIMEN:  N/A  COUNTS:  YES  TOURNIQUET:  * No tourniquets in log *  DICTATION: .Dragon Dictation  PLAN OF CARE: Admit to inpatient   PATIENT DISPOSITION:  PACU - hemodynamically stable.   Delay start of Pharmacological VTE agent (>24hrs) due to surgical blood loss or risk of bleeding: yes

## 2024-12-06 NOTE — Progress Notes (Signed)
" °   12/05/24 1916  Assess: MEWS Score  Temp 98.3 F (36.8 C)  BP (!) 157/77  MAP (mmHg) 99  Pulse Rate (!) 113  SpO2 97 %  O2 Device Room Air  Assess: MEWS Score  MEWS Temp 0  MEWS Systolic 0  MEWS Pulse 2  MEWS RR 0  MEWS LOC 0  MEWS Score 2  MEWS Score Color Yellow  Assess: if the MEWS score is Yellow or Red  Were vital signs accurate and taken at a resting state? Yes  Does the patient meet 2 or more of the SIRS criteria? Yes  Does the patient have a confirmed or suspected source of infection? No  MEWS guidelines implemented  Yes, yellow  Treat  MEWS Interventions Considered administering scheduled or prn medications/treatments as ordered  Take Vital Signs  Increase Vital Sign Frequency  Yellow: Q2hr x1, continue Q4hrs until patient remains green for 12hrs  Escalate  MEWS: Escalate Yellow: Discuss with charge nurse and consider notifying provider and/or RRT  Notify: Charge Nurse/RN  Name of Charge Nurse/RN Notified Geneticist, Molecular  Provider Notification  Provider Name/Title Erminio Cone NP  Date Provider Notified 12/05/24  Time Provider Notified 2037  Method of Notification  (Secure chat)  Notification Reason Other (Comment) (Yellow MEWS)  Provider response No new orders  Date of Provider Response 12/05/24  Time of Provider Response 2038  Assess: SIRS CRITERIA  SIRS Temperature  0  SIRS Respirations  0  SIRS Pulse 1  SIRS WBC 0  SIRS Score Sum  1     Patient went to a yellow mews. HR elevated.  "

## 2024-12-06 NOTE — Plan of Care (Signed)
  Problem: Education: Goal: Knowledge of General Education information will improve Description: Including pain rating scale, medication(s)/side effects and non-pharmacologic comfort measures Outcome: Progressing   Problem: Pain Managment: Goal: General experience of comfort will improve and/or be controlled Outcome: Progressing

## 2024-12-06 NOTE — Progress Notes (Signed)
 Subjective: 1 Day Post-Op Procedures (LRB): HEMIARTHROPLASTY (BIPOLAR) HIP (Right) Patient reports pain as mild.    Objective: Vital signs in last 24 hours: Temp:  [97.6 F (36.4 C)-98.5 F (36.9 C)] 98.1 F (36.7 C) (01/02 0739) Pulse Rate:  [97-118] 117 (01/02 0739) Resp:  [14-17] 17 (01/01 1630) BP: (125-157)/(58-79) 150/67 (01/02 0739) SpO2:  [92 %-99 %] 92 % (01/02 0739)  Intake/Output from previous day: 01/01 0701 - 01/02 0700 In: 1874.4 [P.O.:480; I.V.:1221.7; IV Piggyback:172.8] Out: 3075 [Urine:3025; Blood:50] Intake/Output this shift: No intake/output data recorded.  Recent Labs    12/04/24 1959 12/05/24 0529 12/06/24 0444  HGB 13.1 12.8 12.0   Recent Labs    12/05/24 0529 12/06/24 0444  WBC 8.0 10.9*  RBC 4.65 4.27  HCT 41.2 37.4  PLT 247 208   Recent Labs    12/04/24 1959 12/05/24 0529  NA 143 138  K 4.6 4.5  CL 104 103  CO2 32 27  BUN 25* 19  CREATININE 1.43* 1.08*  GLUCOSE 93 109*  CALCIUM 8.8* 8.5*   No results for input(s): LABPT, INR in the last 72 hours.  Neurologically intact Neurovascular intact Sensation intact distally Intact pulses distally Dorsiflexion/Plantar flexion intact Incision: no drainage Compartment soft   Assessment/Plan: 1 Day Post-Op Procedures (LRB): HEMIARTHROPLASTY (BIPOLAR) HIP (Right) Advance diet Up with therapy D/C IV fluids Discharge to SNF when bed available   FU Ortho in 4 weeks DVT aspirin x 6 weeks      [name] 12/06/2024, 7:44 AM

## 2024-12-06 NOTE — TOC Progression Note (Addendum)
 Transition of Care [facility]) - Progression Note    Patient Details  Name: Kathryn Lynn MRN: [id number] Date of Birth: 1951/02/09  [facility] CM/SW Contact  Hoy DELENA Bigness, LCSW Phone Number: 12/06/2024, 11:05 AM  Clinical Narrative:    Pt agreeable to recommendation for SNF placement. Pt reports going to [facility] in the past however, does not want to go to [facility] for STR at this time. Pt does not have facility preference and is agreeable to referrals being sent out to facilities in [facility]. Referrals have been faxed out and currently pending bed offers.    ADDENDUM: Reviewed bed offers with pt who has accepted offer for STR at [facility]. Insurance auth requested and currently pending.                     Expected Discharge Plan and Services                                               Social Drivers of Health (SDOH) Interventions SDOH Screenings   Food Insecurity: No Food Insecurity (12/04/2024)  Housing: Low Risk (12/04/2024)  Transportation Needs: No Transportation Needs (12/04/2024)  Utilities: Not At Risk (12/04/2024)  Alcohol Screen: Low Risk (05/01/2024)  Depression (PHQ2-9): Low Risk (06/24/2024)  Financial Resource Strain: Low Risk (05/01/2024)  Physical Activity: Inactive (05/01/2024)  Social Connections: Moderately Integrated (12/04/2024)  Stress: No Stress Concern Present (05/01/2024)  Tobacco Use: Low Risk (12/05/2024)  Health Literacy: Adequate Health Literacy (05/01/2024)    Readmission Risk Interventions    12/05/2024    8:55 AM  Readmission Risk Prevention Plan  Post Dischage Appt Complete  Medication Screening Complete  Transportation Screening Complete

## 2024-12-06 NOTE — Plan of Care (Signed)
" °  Problem: Acute Rehab PT Goals(only PT should resolve) Goal: Pt Will Go Supine/Side To Sit Outcome: Progressing Flowsheets (Taken 12/06/2024 1450) Pt will go Supine/Side to Sit: with moderate assist Goal: Patient Will Transfer Sit To/From Stand Outcome: Progressing Flowsheets (Taken 12/06/2024 1450) Patient will transfer sit to/from stand:  with minimal assist  with moderate assist Goal: Pt Will Transfer Bed To Chair/Chair To Bed Outcome: Progressing Flowsheets (Taken 12/06/2024 1450) Pt will Transfer Bed to Chair/Chair to Bed:  with min assist  with mod assist Goal: Pt Will Ambulate Outcome: Progressing Flowsheets (Taken 12/06/2024 1450) Pt will Ambulate:  25 feet  with moderate assist  with rolling walker   2:50 PM, 12/06/2024 Lynwood Music, MPT Physical Therapist with [facility] 336 [phone] office [phone] mobile phone  "

## 2024-12-06 NOTE — Hospital Course (Signed)
 74 y.o. female with medical history significant for OSA on CPAP, obesity, hyperlipidemia, CKD 3B, previous back surgery, who presented to the ER after a mechanical fall.  The patient reports she tripped over a rug causing her to fall.  She was in her usual state of health prior to this.  Endorses severe pain in her right hip.  EMS was activated.  The patient was brought into the ER for further evaluation.   In the ER, vital signs are stable.  Right hip x-ray revealed right femoral neck fracture with varus deformity.     EDP discussed the case with orthopedic surgery.  Recommended admission by medicine team.  She was taken to OR on 12/05/24.  Admitted by [facility], hospitalist service.

## 2024-12-06 NOTE — Progress Notes (Signed)
" °   12/06/24 1600  Assess: MEWS Score  Temp 98.2 F (36.8 C)  BP (!) 76/48  MAP (mmHg) (!) 57  Pulse Rate 98  SpO2 91 %  O2 Device Room Air  Assess: MEWS Score  MEWS Temp 0  MEWS Systolic 2  MEWS Pulse 0  MEWS RR 1  MEWS LOC 0  MEWS Score 3  MEWS Score Color Yellow  Assess: if the MEWS score is Yellow or Red  Were vital signs accurate and taken at a resting state? Yes  Does the patient meet 2 or more of the SIRS criteria? No  Does the patient have a confirmed or suspected source of infection? No  MEWS guidelines implemented  No, previously yellow, continue vital signs every 4 hours  Notify: Charge Nurse/RN  Name of Charge Nurse/RN Notified kristi thomas,RN  Provider Notification  Provider Name/Title Dr. Vicci  Date Provider Notified 12/06/24  Method of Notification Page  Notification Reason Other (Comment) (patient hypotensive, see BP)  Provider response See new orders  Assess: SIRS CRITERIA  SIRS Temperature  0  SIRS Respirations  0  SIRS Pulse 1  SIRS WBC 0  SIRS Score Sum  1    "

## 2024-12-07 DIAGNOSIS — S72001A Fracture of unspecified part of neck of right femur, initial encounter for closed fracture: Secondary | ICD-10-CM | POA: Diagnosis not present

## 2024-12-07 DIAGNOSIS — R030 Elevated blood-pressure reading, without diagnosis of hypertension: Secondary | ICD-10-CM | POA: Diagnosis not present

## 2024-12-07 DIAGNOSIS — R000 Tachycardia, unspecified: Secondary | ICD-10-CM | POA: Diagnosis not present

## 2024-12-07 LAB — BASIC METABOLIC PANEL WITH GFR
Anion gap: 13 (ref 5–15)
BUN: 17 mg/dL (ref 8–23)
CO2: 22 mmol/L (ref 22–32)
Calcium: 8.4 mg/dL — ABNORMAL LOW (ref 8.9–10.3)
Chloride: 95 mmol/L — ABNORMAL LOW (ref 98–111)
Creatinine, Ser: 1.38 mg/dL — ABNORMAL HIGH (ref 0.44–1.00)
GFR, Estimated: 40 mL/min — ABNORMAL LOW
Glucose, Bld: 119 mg/dL — ABNORMAL HIGH (ref 70–99)
Potassium: 4.7 mmol/L (ref 3.5–5.1)
Sodium: 130 mmol/L — ABNORMAL LOW (ref 135–145)

## 2024-12-07 LAB — CBC
HCT: 38.4 % (ref 36.0–46.0)
Hemoglobin: 12.2 g/dL (ref 12.0–15.0)
MCH: 28 pg (ref 26.0–34.0)
MCHC: 31.8 g/dL (ref 30.0–36.0)
MCV: 88.1 fL (ref 80.0–100.0)
Platelets: 207 K/uL (ref 150–400)
RBC: 4.36 MIL/uL (ref 3.87–5.11)
RDW: 14.7 % (ref 11.5–15.5)
WBC: 13 K/uL — ABNORMAL HIGH (ref 4.0–10.5)
nRBC: 0 % (ref 0.0–0.2)

## 2024-12-07 LAB — MAGNESIUM: Magnesium: 2.7 mg/dL — ABNORMAL HIGH (ref 1.7–2.4)

## 2024-12-07 NOTE — Plan of Care (Signed)
   Problem: Activity: Goal: Risk for activity intolerance will decrease Outcome: Progressing   Problem: Pain Managment: Goal: General experience of comfort will improve and/or be controlled Outcome: Progressing   Problem: Safety: Goal: Ability to remain free from injury will improve Outcome: Progressing

## 2024-12-07 NOTE — Plan of Care (Signed)
" °  Problem: Education: Goal: Knowledge of General Education information will improve Description: Including pain rating scale, medication(s)/side effects and non-pharmacologic comfort measures Outcome: Progressing   Problem: Health Behavior/Discharge Planning: Goal: Ability to manage health-related needs will improve Outcome: Not Progressing   Problem: Clinical Measurements: Goal: Ability to maintain clinical measurements within normal limits will improve Outcome: Progressing Goal: Will remain free from infection Outcome: Progressing Goal: Diagnostic test results will improve Outcome: Progressing Goal: Respiratory complications will improve Outcome: Progressing Goal: Cardiovascular complication will be avoided Outcome: Progressing   Problem: Activity: Goal: Risk for activity intolerance will decrease Outcome: Progressing   Problem: Nutrition: Goal: Adequate nutrition will be maintained Outcome: Progressing   Problem: Pain Managment: Goal: General experience of comfort will improve and/or be controlled Outcome: Progressing   Problem: Safety: Goal: Ability to remain free from injury will improve Outcome: Progressing   Problem: Bowel/Gastric: Goal: Gastrointestinal status for postoperative course will improve Outcome: Progressing   Problem: Cardiac: Goal: Ability to maintain an adequate cardiac output Outcome: Progressing Goal: Will show no evidence of cardiac arrhythmias Outcome: Progressing   Problem: Nutritional: Goal: Will attain and maintain optimal nutritional status Outcome: Progressing   Problem: Neurological: Goal: Will regain or maintain usual level of consciousness Outcome: Progressing   Problem: Clinical Measurements: Goal: Ability to maintain clinical measurements within normal limits Outcome: Progressing Goal: Postoperative complications will be avoided or minimized Outcome: Progressing   Problem: Respiratory: Goal: Will regain and/or maintain  adequate ventilation Outcome: Progressing Goal: Respiratory status will improve Outcome: Progressing   Problem: Skin Integrity: Goal: Demonstrates signs of wound healing without infection Outcome: Progressing   Problem: Urinary Elimination: Goal: Will remain free from infection Outcome: Progressing Goal: Ability to achieve and maintain adequate urine output Outcome: Progressing   "

## 2024-12-07 NOTE — Progress Notes (Addendum)
 " PROGRESS NOTE   Kathryn Lynn  FMW:[id number] DOB: July 06, 1951 DOA: 12/04/2024 PCP: Catherine Charlies LABOR, DO   Chief Complaint  Patient presents with   Fall   Level of care: Telemetry  Brief Admission History:  74 y.o. female with medical history significant for OSA on CPAP, obesity, hyperlipidemia, CKD 3B, previous back surgery, who presented to the ER after a mechanical fall.  The patient reports she tripped over a rug causing her to fall.  She was in her usual state of health prior to this.  Endorses severe pain in her right hip.  EMS was activated.  The patient was brought into the ER for further evaluation.   In the ER, vital signs are stable.  Right hip x-ray revealed right femoral neck fracture with varus deformity.     EDP discussed the case with orthopedic surgery.  Recommended admission by medicine team.  She was taken to OR on 12/05/24.  Admitted by [facility], hospitalist service.   Assessment and Plan:  Closed displaced fracture right femoral neck post mechanical fall Pain control as needed Postop s/p right hip hemiarthroplasty 12/05/2024 with Dr. Margrette Continue postop care PT recommending SNF rehab placement  [facility] consulted to assist with SNF    CKD 3B Creatinine improved with hydration Gentle IV fluid hydration completed  Monitoring urine output Avoid hepatotoxic agents, dehydration, and hypotension Repeat BMP in the morning.  Leukocytosis -- reactive from surgery no s/s of infection found at this time   Hyperlipidemia Resume home simvastatin   Elevated BPs and tachycardia -- IV lopressor  DC'd due to soft BPs -- monitoring, treating supportively  -- elevated BPs resolved now, likely was from postop pain   OSA Resume home CPAP   Obesity BMI 34 Recommend weight loss outpatient regular physical activity and healthy dieting.  DVT prophylaxis: SCDs Code Status: Full  Family Communication:  Disposition: SNF for short term rehab   Consultants:  ortho  Procedures:   Right hip hemiarthroplasty 12/05/24  Antimicrobials:    Subjective: Pt reports no specific complaints, pain has been manageable and she is willing to work with PT and go to STR  Objective: Vitals:   12/06/24 1953 12/06/24 2022 12/07/24 0006 12/07/24 0336  BP: 115/62 (!) 110/49 (!) 110/54 (!) 103/53  Pulse: (!) 103 (!) 102 87 86  Resp:   14   Temp: 99.3 F (37.4 C) 99.2 F (37.3 C) (!) 97.5 F (36.4 C) (!) 97.5 F (36.4 C)  TempSrc: Oral Oral Oral Oral  SpO2: (!) 87% 97% 99% 99%  Weight:      Height:        Intake/Output Summary (Last 24 hours) at 12/07/2024 0909 Last data filed at 12/07/2024 0908 Gross per 24 hour  Intake 1816.1 ml  Output 480 ml  Net 1336.1 ml   Filed Weights   12/04/24 1857 12/04/24 2239  Weight: 84.9 kg 85.5 kg   Examination:  General exam: Appears calm and comfortable  Respiratory system: Clear to auscultation. Respiratory effort normal. Cardiovascular system: tachycardic, normal S1 & S2 heard. No JVD, murmurs, rubs, gallops or clicks. No pedal edema. Gastrointestinal system: Abdomen is nondistended, soft and nontender. No organomegaly or masses felt. Normal bowel sounds heard. Central nervous system: Alert and oriented. No focal neurological deficits. Extremities: bandages clean dry intact. Skin: No rashes, lesions or ulcers. Psychiatry: Judgement and insight appear normal. Mood & affect appropriate.   Data Reviewed: I have personally reviewed following labs and imaging studies  CBC: Recent Labs  Lab  12/04/24 1959 12/05/24 0529 12/06/24 0444 12/07/24 0548  WBC 10.3 8.0 10.9* 13.0*  NEUTROABS 6.2  --   --   --   HGB 13.1 12.8 12.0 12.2  HCT 42.0 41.2 37.4 38.4  MCV 88.6 88.6 87.6 88.1  PLT 304 247 208 207    Basic Metabolic Panel: Recent Labs  Lab 12/04/24 1959 12/05/24 0529 12/06/24 0444 12/07/24 0548  NA 143 138 134* 130*  K 4.6 4.5 4.4 4.7  CL 104 103 100 95*  CO2 32 27 25 22   GLUCOSE 93 109* 120* 119*  BUN 25* 19 10 17    CREATININE 1.43* 1.08* 0.91 1.38*  CALCIUM 8.8* 8.5* 8.0* 8.4*  MG  --  2.7* 1.7 2.7*  PHOS  --  3.1  --   --     CBG: No results for input(s): GLUCAP in the last 168 hours.  Recent Results (from the past 240 hours)  Surgical pcr screen     Status: Abnormal   Collection Time: 12/05/24 10:37 AM   Specimen: Nasal Mucosa; Nasal Swab  Result Value Ref Range Status   MRSA, PCR NEGATIVE NEGATIVE Final   Staphylococcus aureus POSITIVE (A) NEGATIVE Final    Comment: RESULT CALLED TO, READ BACK BY AND VERIFIED WITH: CASSIDY GANOE @ 1231 ON 12/05/24 C VARNER (NOTE) The Xpert SA Assay (FDA approved for NASAL specimens in patients 60 years of age and older), is one component of a comprehensive surveillance program. It is not intended to diagnose infection nor to guide or monitor treatment. Performed at [facility], [address], [place], [place] [address]      Radiology Studies: DG HIP UNILAT WITH PELVIS 2-3 VIEWS RIGHT Result Date: 12/05/2024 CLINICAL DATA:  Status post hip replacement. EXAM: DG HIP (WITH OR WITHOUT PELVIS) 2-3V RIGHT COMPARISON:  Preoperative imaging FINDINGS: Right hip hemiarthroplasty in expected alignment. No periprosthetic lucency or fracture. Recent postsurgical change includes air and edema in the soft tissues. Lumbo iliac fusion hardware is partially included in the field of view. IMPRESSION: Right hip hemiarthroplasty without immediate postoperative complication. Electronically Signed   By: Andrea Gasman M.D.   On: 12/05/2024 17:34    Scheduled Meds:  cholecalciferol   1,000 Units Oral Daily   docusate sodium   100 mg Oral BID   enoxaparin  (LOVENOX ) injection  40 mg Subcutaneous Q24H   gabapentin   300 mg Oral BID   iron  polysaccharides  150 mg Oral Daily   magnesium  oxide  400 mg Oral Daily   mupirocin  ointment  1 Application Nasal BID   omega-3 acid ethyl esters  1 g Oral BID   simvastatin   20 mg Oral QHS   Continuous Infusions:   LOS: 3 days   Time  spent: 55 mins  Winnell Bento Vicci, MD How to contact the [facility] Attending or Consulting provider 7A - 7P or covering provider during after hours 7P -7A, for this patient?  Check the care team in [facility] and look for a) attending/consulting [facility] provider listed and b) the TR[facility] team listed Log into [email or web address] to find provider on call.  Locate the [facility] provider you are looking for under [facility] and page to a number that you can be directly reached. If you still have difficulty reaching the provider, please page the [facility] (Director on Call) for the Hospitalists listed on amion for assistance.  12/07/2024, 9:09 AM    "

## 2024-12-07 NOTE — Progress Notes (Signed)
 Physical Therapy Treatment Patient Details Name: Kathryn Lynn MRN: [id number] DOB: 03/09/1951 Today's Date: 12/07/2024   History of Present Illness Kathryn Lynn is a 74 y.o. female s/p Right hip hemiarthroplasty on 12/07/23 with medical history significant for OSA on CPAP, obesity, hyperlipidemia, CKD 3B, previous back surgery, who presented to the ER after a mechanical fall.  The patient reports she tripped over a rug causing her to fall.  She was in her usual state of health prior to this.  Endorses severe pain in her right hip.  EMS was activated.  The patient was brought into the ER for further evaluation.     In the ER, vital signs are stable.  Right hip x-ray revealed right femoral neck fracture with varus deformity.       The patient received IV opiate analgesics.    PT Comments  Patient agreeable for therapy. Patient demonstrates slow labored movement for sitting up at bedside with improvement for using BUE during supine to sitting and scooting to EOB, completed BLE exercises while seated at bedside, had difficulty taking steps at bedside due to increasing right hip pain and fair/poor carryover for putting body weight through arms when weightbearing on RLE using RW. Patient tolerated sitting up in chair after therapy. Patient will benefit from continued skilled physical therapy in hospital and recommended venue below to increase strength, balance, endurance for safe ADLs and gait.       If plan is discharge home, recommend the following: A lot of help with bathing/dressing/bathroom;A lot of help with walking and/or transfers;Help with stairs or ramp for entrance;Assist for transportation;Assistance with cooking/housework   Can travel by private vehicle     No  Equipment Recommendations  None recommended by PT    Recommendations for Other Services       Precautions / Restrictions Precautions Precautions: Fall Recall of Precautions/Restrictions: Intact Restrictions Weight Bearing  Restrictions Per Provider Order: Yes RLE Weight Bearing Per Provider Order: Weight bearing as tolerated     Mobility  Bed Mobility Overal bed mobility: Needs Assistance Bed Mobility: Supine to Sit     Supine to sit: Mod assist, Max assist, HOB elevated     General bed mobility comments: improvement for using BUE during supine to sitting, but requires increased time, frequent rest breaks due to increasing right hip pain    Transfers Overall transfer level: Needs assistance Equipment used: Rolling walker (2 wheels) Transfers: Sit to/from Stand, Bed to chair/wheelchair/BSC Sit to Stand: Mod assist   Step pivot transfers: Mod assist, Max assist       General transfer comment: unsteady labored movement with diffiuclty advancing LLE due to increasing right hip with weightbearing and fair/poor carryover for putting body weight through arms    Ambulation/Gait Ambulation/Gait assistance: Mod assist, Max assist Gait Distance (Feet): 6 Feet   Gait Pattern/deviations: Decreased step length - left, Decreased stance time - right, Decreased stride length, Antalgic, Decreased step length - right, Trunk flexed Gait velocity: slow     General Gait Details: limited to a few slow labored side steps with poor carryover for weightbearing through arms due to weakness resulting in increasing right hip pain   Stairs             Wheelchair Mobility     Tilt Bed    Modified Rankin (Stroke Patients Only)       Balance Overall balance assessment: Needs assistance Sitting-balance support: Feet supported, No upper extremity supported Sitting balance-Leahy Scale: Fair Sitting balance -  Comments: fair/good seated at EOB   Standing balance support: Reliant on assistive device for balance, During functional activity, Bilateral upper extremity supported                                Communication Communication Communication: No apparent difficulties  Cognition Arousal:  Alert Behavior During Therapy: WFL for tasks assessed/performed                             Following commands: Intact      Cueing Cueing Techniques: Verbal cues, Tactile cues  Exercises General Exercises - Lower Extremity Long Arc Quad: Seated, AROM, Strengthening, Both, 10 reps Hip Flexion/Marching: Seated, AAROM, Strengthening, Both, 10 reps Toe Raises: Seated, AROM, Strengthening, Both, 10 reps Heel Raises: Seated, AROM, Strengthening, Both, 10 reps    General Comments        Pertinent Vitals/Pain Pain Assessment Pain Assessment: 0-10 Pain Score: 2  Pain Location: right hip with movement Pain Descriptors / Indicators: Discomfort, Grimacing, Sharp Pain Intervention(s): Limited activity within patient's tolerance, Monitored during session, Repositioned    Home Living                          Prior Function            PT Goals (current goals can now be found in the care plan section) Acute Rehab PT Goals Patient Stated Goal: return home after rehab PT Goal Formulation: With patient Time For Goal Achievement: 12/20/24 Potential to Achieve Goals: Good Progress towards PT goals: Progressing toward goals    Frequency    Min 4X/week      PT Plan      Co-evaluation              AM-PAC PT 6 Clicks Mobility   Outcome Measure  Help needed turning from your back to your side while in a flat bed without using bedrails?: A Lot Help needed moving from lying on your back to sitting on the side of a flat bed without using bedrails?: A Lot Help needed moving to and from a bed to a chair (including a wheelchair)?: A Lot Help needed standing up from a chair using your arms (e.g., wheelchair or bedside chair)?: A Lot Help needed to walk in hospital room?: A Lot Help needed climbing 3-5 steps with a railing? : Total 6 Click Score: 11    End of Session   Activity Tolerance: Patient tolerated treatment well;Patient limited by  fatigue Patient left: in chair;with call bell/phone within reach Nurse Communication: Mobility status PT Visit Diagnosis: Unsteadiness on feet (R26.81);Other abnormalities of gait and mobility (R26.89);Muscle weakness (generalized) (M62.81)     Time: 9081-9060 PT Time Calculation (min) (ACUTE ONLY): 21 min  Charges:    $Therapeutic Exercise: 8-22 mins $Therapeutic Activity: 8-22 mins PT General Charges $$ ACUTE PT VISIT: 1 Visit                     12:44 PM, 12/07/2024 Lynwood Music, MPT Physical Therapist with [facility] 336 [phone] office [phone] mobile phone

## 2024-12-08 DIAGNOSIS — S72001A Fracture of unspecified part of neck of right femur, initial encounter for closed fracture: Secondary | ICD-10-CM | POA: Diagnosis not present

## 2024-12-08 DIAGNOSIS — R030 Elevated blood-pressure reading, without diagnosis of hypertension: Secondary | ICD-10-CM | POA: Diagnosis not present

## 2024-12-08 DIAGNOSIS — M25551 Pain in right hip: Secondary | ICD-10-CM

## 2024-12-08 LAB — BASIC METABOLIC PANEL WITH GFR
Anion gap: 8 (ref 5–15)
BUN: 20 mg/dL (ref 8–23)
CO2: 26 mmol/L (ref 22–32)
Calcium: 8.3 mg/dL — ABNORMAL LOW (ref 8.9–10.3)
Chloride: 100 mmol/L (ref 98–111)
Creatinine, Ser: 1.13 mg/dL — ABNORMAL HIGH (ref 0.44–1.00)
GFR, Estimated: 51 mL/min — ABNORMAL LOW
Glucose, Bld: 118 mg/dL — ABNORMAL HIGH (ref 70–99)
Potassium: 4.4 mmol/L (ref 3.5–5.1)
Sodium: 134 mmol/L — ABNORMAL LOW (ref 135–145)

## 2024-12-08 LAB — CBC
HCT: 34.6 % — ABNORMAL LOW (ref 36.0–46.0)
Hemoglobin: 11.1 g/dL — ABNORMAL LOW (ref 12.0–15.0)
MCH: 27.8 pg (ref 26.0–34.0)
MCHC: 32.1 g/dL (ref 30.0–36.0)
MCV: 86.5 fL (ref 80.0–100.0)
Platelets: 202 K/uL (ref 150–400)
RBC: 4 MIL/uL (ref 3.87–5.11)
RDW: 14.9 % (ref 11.5–15.5)
WBC: 10.6 K/uL — ABNORMAL HIGH (ref 4.0–10.5)
nRBC: 0 % (ref 0.0–0.2)

## 2024-12-08 LAB — DIFFERENTIAL
Abs Immature Granulocytes: 0.07 K/uL (ref 0.00–0.07)
Basophils Absolute: 0 K/uL (ref 0.0–0.1)
Basophils Relative: 0 %
Eosinophils Absolute: 0.1 K/uL (ref 0.0–0.5)
Eosinophils Relative: 1 %
Immature Granulocytes: 1 %
Lymphocytes Relative: 12 %
Lymphs Abs: 1.3 K/uL (ref 0.7–4.0)
Monocytes Absolute: 1.1 K/uL — ABNORMAL HIGH (ref 0.1–1.0)
Monocytes Relative: 11 %
Neutro Abs: 8 K/uL — ABNORMAL HIGH (ref 1.7–7.7)
Neutrophils Relative %: 75 %

## 2024-12-08 NOTE — Progress Notes (Signed)
 " PROGRESS NOTE   Kathryn Lynn  FMW:[id number] DOB: 1951/08/26 DOA: 12/04/2024 PCP: Catherine Charlies LABOR, DO   Chief Complaint  Patient presents with   Fall   Level of care: Telemetry  Brief Admission History:  74 y.o. female with medical history significant for OSA on CPAP, obesity, hyperlipidemia, CKD 3B, previous back surgery, who presented to the ER after a mechanical fall.  The patient reports she tripped over a rug causing her to fall.  She was in her usual state of health prior to this.  Endorses severe pain in her right hip.  EMS was activated.  The patient was brought into the ER for further evaluation.   In the ER, vital signs are stable.  Right hip x-ray revealed right femoral neck fracture with varus deformity.     EDP discussed the case with orthopedic surgery.  Recommended admission by medicine team.  She was taken to OR on 12/05/24.  Admitted by [facility], hospitalist service.   Assessment and Plan:  Closed displaced fracture right femoral neck post mechanical fall Pain control as needed Postop s/p right hip hemiarthroplasty 12/05/2024 with Dr. Margrette Continue postop care PT recommending SNF rehab placement  [facility] consulted to assist with SNF    CKD 3B Creatinine improved with hydration Gentle IV fluid hydration completed  Monitoring urine output Avoid hepatotoxic agents, dehydration, and hypotension Repeat BMP stable   Leukocytosis -- reactive from surgery no s/s of infection found at this time   Hyperlipidemia Resumed home simvastatin   Elevated BPs and tachycardia -- IV lopressor  DC'd due to soft BPs -- monitoring, treating supportively  -- elevated BPs resolved now, likely was from postop pain   OSA Resume home CPAP   Obesity BMI 34 Recommend weight loss outpatient regular physical activity and healthy dieting.  DVT prophylaxis: SCDs Code Status: Full  Family Communication:  Disposition: SNF for short term rehab   Consultants:  ortho  Procedures:  Right  hip hemiarthroplasty 12/05/24  Antimicrobials:    Subjective: Pt reports she is more sore today but pain meds are helping.   Objective: Vitals:   12/07/24 1924 12/08/24 0209 12/08/24 0428 12/08/24 1412  BP: (!) 110/54  107/60 111/70  Pulse: (!) 110  90 99  Resp:  16    Temp: 99.6 F (37.6 C)  99 F (37.2 C) 98.1 F (36.7 C)  TempSrc: Oral  Oral Oral  SpO2: 99%  97% 99%  Weight:      Height:        Intake/Output Summary (Last 24 hours) at 12/08/2024 1415 Last data filed at 12/08/2024 0900 Gross per 24 hour  Intake 240 ml  Output 500 ml  Net -260 ml   Filed Weights   12/04/24 1857 12/04/24 2239  Weight: 84.9 kg 85.5 kg   Examination:  General exam: Appears calm and comfortable  Respiratory system: Clear to auscultation. Respiratory effort normal. Cardiovascular system: tachycardic, normal S1 & S2 heard. No JVD, murmurs, rubs, gallops or clicks. No pedal edema. Gastrointestinal system: Abdomen is nondistended, soft and nontender. No organomegaly or masses felt. Normal bowel sounds heard. Central nervous system: Alert and oriented. No focal neurological deficits. Extremities: bandages clean dry intact. Skin: No rashes, lesions or ulcers. Psychiatry: Judgement and insight appear normal. Mood & affect appropriate.   Data Reviewed: I have personally reviewed following labs and imaging studies  CBC: Recent Labs  Lab 12/04/24 1959 12/05/24 0529 12/06/24 0444 12/07/24 0548 12/08/24 0542  WBC 10.3 8.0 10.9* 13.0* 10.6*  NEUTROABS 6.2  --   --   --  8.0*  HGB 13.1 12.8 12.0 12.2 11.1*  HCT 42.0 41.2 37.4 38.4 34.6*  MCV 88.6 88.6 87.6 88.1 86.5  PLT 304 247 208 207 202    Basic Metabolic Panel: Recent Labs  Lab 12/04/24 1959 12/05/24 0529 12/06/24 0444 12/07/24 0548 12/08/24 0542  NA 143 138 134* 130* 134*  K 4.6 4.5 4.4 4.7 4.4  CL 104 103 100 95* 100  CO2 32 27 25 22 26   GLUCOSE 93 109* 120* 119* 118*  BUN 25* 19 10 17 20   CREATININE 1.43* 1.08* 0.91 1.38*  1.13*  CALCIUM 8.8* 8.5* 8.0* 8.4* 8.3*  MG  --  2.7* 1.7 2.7*  --   PHOS  --  3.1  --   --   --     CBG: No results for input(s): GLUCAP in the last 168 hours.  Recent Results (from the past 240 hours)  Surgical pcr screen     Status: Abnormal   Collection Time: 12/05/24 10:37 AM   Specimen: Nasal Mucosa; Nasal Swab  Result Value Ref Range Status   MRSA, PCR NEGATIVE NEGATIVE Final   Staphylococcus aureus POSITIVE (A) NEGATIVE Final    Comment: RESULT CALLED TO, READ BACK BY AND VERIFIED WITH: CASSIDY GANOE @ 1231 ON 12/05/24 C VARNER (NOTE) The Xpert SA Assay (FDA approved for NASAL specimens in patients 29 years of age and older), is one component of a comprehensive surveillance program. It is not intended to diagnose infection nor to guide or monitor treatment. Performed at [facility], [address], [place], [place] [address]      Radiology Studies: No results found.   Scheduled Meds:  cholecalciferol   1,000 Units Oral Daily   docusate sodium   100 mg Oral BID   enoxaparin  (LOVENOX ) injection  40 mg Subcutaneous Q24H   gabapentin   300 mg Oral BID   iron  polysaccharides  150 mg Oral Daily   mupirocin  ointment  1 Application Nasal BID   omega-3 acid ethyl esters  1 g Oral BID   simvastatin   20 mg Oral QHS   Continuous Infusions:   LOS: 4 days   Time spent: 55 mins  Josi Roediger Vicci, MD How to contact the [facility] Attending or Consulting provider 7A - 7P or covering provider during after hours 7P -7A, for this patient?  Check the care team in [facility] and look for a) attending/consulting [facility] provider listed and b) the [facility] team listed Log into [email or web address] to find provider on call.  Locate the [facility] provider you are looking for under [facility] and page to a number that you can be directly reached. If you still have difficulty reaching the provider, please page the [facility] (Director on Call) for the Hospitalists listed on amion for assistance.  12/08/2024, 2:15 PM     "

## 2024-12-08 NOTE — Plan of Care (Signed)

## 2024-12-08 NOTE — Progress Notes (Signed)
 Patient OOB sitting in recliner chair, ambulated with assistance x1 with walker.

## 2024-12-08 NOTE — Plan of Care (Signed)

## 2024-12-09 DIAGNOSIS — R030 Elevated blood-pressure reading, without diagnosis of hypertension: Secondary | ICD-10-CM | POA: Diagnosis not present

## 2024-12-09 DIAGNOSIS — S72001A Fracture of unspecified part of neck of right femur, initial encounter for closed fracture: Secondary | ICD-10-CM | POA: Diagnosis not present

## 2024-12-09 DIAGNOSIS — R000 Tachycardia, unspecified: Secondary | ICD-10-CM | POA: Diagnosis not present

## 2024-12-09 MED ORDER — ALUM & MAG HYDROXIDE-SIMETH 200-200-20 MG/5ML PO SUSP
30.0000 mL | Freq: Once | ORAL | Status: AC
  Administered 2024-12-09: 30 mL via ORAL
  Filled 2024-12-09: qty 30

## 2024-12-09 NOTE — Plan of Care (Signed)

## 2024-12-09 NOTE — Progress Notes (Signed)
 Physical Therapy Treatment Patient Details Name: Kathryn Lynn MRN: [id number] DOB: October 31, 1951 Today's Date: 12/09/2024   History of Present Illness Kathryn Lynn is a 74 y.o. female s/p Right hip hemiarthroplasty on 12/07/23 with medical history significant for OSA on CPAP, obesity, hyperlipidemia, CKD 3B, previous back surgery, who presented to the ER after a mechanical fall.  The patient reports she tripped over a rug causing her to fall.  She was in her usual state of health prior to this.  Endorses severe pain in her right hip.  EMS was activated.  The patient was brought into the ER for further evaluation.     In the ER, vital signs are stable.  Right hip x-ray revealed right femoral neck fracture with varus deformity.       The patient received IV opiate analgesics.    PT Comments  Patient demonstrates improvement for sitting up at bedside with less Right hip pain, increased endurance/distance for taking steps forward/backwards at bedside and limited mostly due to fatigue. Patient tolerated sitting up in chair after therapy. Patient will benefit from continued skilled physical therapy in hospital and recommended venue below to increase strength, balance, endurance for safe ADLs and gait.      If plan is discharge home, recommend the following: A lot of help with bathing/dressing/bathroom;A lot of help with walking and/or transfers;Help with stairs or ramp for entrance;Assist for transportation;Assistance with cooking/housework   Can travel by private vehicle     No  Equipment Recommendations  None recommended by PT    Recommendations for Other Services       Precautions / Restrictions Precautions Precautions: Fall Recall of Precautions/Restrictions: Intact Restrictions Weight Bearing Restrictions Per Provider Order: Yes RLE Weight Bearing Per Provider Order: Weight bearing as tolerated     Mobility  Bed Mobility Overal bed mobility: Needs Assistance Bed Mobility: Supine to  Sit     Supine to sit: Mod assist, HOB elevated Sit to supine: Mod assist, Used rails, HOB elevated   General bed mobility comments: improvement for supine to sitting using bed rail with HOB partially rasied, required less assistance for scooting to EOB    Transfers Overall transfer level: Needs assistance Equipment used: Rolling walker (2 wheels) Transfers: Sit to/from Stand, Bed to chair/wheelchair/BSC Sit to Stand: Min assist, Mod assist   Step pivot transfers: Mod assist       General transfer comment: increased BLE strength for completing sit to stands with less/pain discomfort right hip    Ambulation/Gait Ambulation/Gait assistance: Mod assist Gait Distance (Feet): 15 Feet Assistive device: Rolling walker (2 wheels) Gait Pattern/deviations: Decreased step length - left, Decreased stance time - right, Decreased stride length, Antalgic, Decreased step length - right, Trunk flexed Gait velocity: slow     General Gait Details: tolerated taking steps forward/backwards using RW with slow labored movement, improvement for using BUE on RW for limited weightbearing/pain on RLE   Stairs             Wheelchair Mobility     Tilt Bed    Modified Rankin (Stroke Patients Only)       Balance Overall balance assessment: Needs assistance Sitting-balance support: Feet supported, No upper extremity supported Sitting balance-Leahy Scale: Fair Sitting balance - Comments: fair/good seated at EOB   Standing balance support: Reliant on assistive device for balance, During functional activity, Bilateral upper extremity supported Standing balance-Leahy Scale: Poor Standing balance comment: fair/poor using RW  Communication Communication Communication: No apparent difficulties  Cognition Arousal: Alert Behavior During Therapy: WFL for tasks assessed/performed   PT - Cognitive impairments: No apparent impairments                          Following commands: Intact      Cueing Cueing Techniques: Verbal cues, Tactile cues  Exercises General Exercises - Lower Extremity Long Arc Quad: Seated, AROM, Strengthening, Both, 10 reps Hip Flexion/Marching: Seated, AAROM, Strengthening, Both, 10 reps Toe Raises: Seated, AROM, Strengthening, Both, 10 reps Heel Raises: Seated, AROM, Strengthening, Both, 10 reps    General Comments        Pertinent Vitals/Pain Pain Assessment Pain Assessment: Faces Faces Pain Scale: Hurts a little bit Pain Location: right hip with movement Pain Descriptors / Indicators: Discomfort, Sore Pain Intervention(s): Limited activity within patient's tolerance, Monitored during session, Repositioned    Home Living                          Prior Function            PT Goals (current goals can now be found in the care plan section) Acute Rehab PT Goals Patient Stated Goal: return home after rehab PT Goal Formulation: With patient Time For Goal Achievement: 12/20/24 Potential to Achieve Goals: Good Progress towards PT goals: Progressing toward goals    Frequency    Min 4X/week      PT Plan      Co-evaluation              AM-PAC PT 6 Clicks Mobility   Outcome Measure  Help needed turning from your back to your side while in a flat bed without using bedrails?: A Little Help needed moving from lying on your back to sitting on the side of a flat bed without using bedrails?: A Little Help needed moving to and from a bed to a chair (including a wheelchair)?: A Lot Help needed standing up from a chair using your arms (e.g., wheelchair or bedside chair)?: A Lot Help needed to walk in hospital room?: A Lot Help needed climbing 3-5 steps with a railing? : Total 6 Click Score: 13    End of Session   Activity Tolerance: Patient tolerated treatment well;Patient limited by fatigue Patient left: in chair;with call bell/phone within reach Nurse Communication:  Mobility status PT Visit Diagnosis: Unsteadiness on feet (R26.81);Other abnormalities of gait and mobility (R26.89);Muscle weakness (generalized) (M62.81)     Time: 8849-8787 PT Time Calculation (min) (ACUTE ONLY): 22 min  Charges:    $Therapeutic Exercise: 8-22 mins $Therapeutic Activity: 8-22 mins PT General Charges $$ ACUTE PT VISIT: 1 Visit                     3:39 PM, 12/09/2024 Lynwood Music, MPT Physical Therapist with [facility] 336 [phone] office [phone] mobile phone

## 2024-12-09 NOTE — Progress Notes (Signed)
 Nurse at bedside,patient alert and oriented times four.Patient out of bed to chair this morning.However,patient did c/o left flank area pain rated a 4,a constant achy pain.Blood pressure was 124/44,heart rate 95,Dr Clanford Johnson notified.Plan of care on going.

## 2024-12-09 NOTE — Plan of Care (Signed)
   Problem: Education: Goal: Knowledge of General Education information will improve Description Including pain rating scale, medication(s)/side effects and non-pharmacologic comfort measures Outcome: Progressing   Problem: Education: Goal: Knowledge of General Education information will improve Description Including pain rating scale, medication(s)/side effects and non-pharmacologic comfort measures Outcome: Progressing

## 2024-12-09 NOTE — Progress Notes (Signed)
 " PROGRESS NOTE   Kathryn Lynn  FMW:[id number] DOB: 1951/09/15 DOA: 12/04/2024 PCP: Catherine Charlies LABOR, DO   Chief Complaint  Patient presents with   Fall   Level of care: Med-Surg  Brief Admission History:  74 y.o. female with medical history significant for OSA on CPAP, obesity, hyperlipidemia, CKD 3B, previous back surgery, who presented to the ER after a mechanical fall.  The patient reports she tripped over a rug causing her to fall.  She was in her usual state of health prior to this.  Endorses severe pain in her right hip.  EMS was activated.  The patient was brought into the ER for further evaluation.   In the ER, vital signs are stable.  Right hip x-ray revealed right femoral neck fracture with varus deformity.     EDP discussed the case with orthopedic surgery.  Recommended admission by medicine team.  She was taken to OR on 12/05/24.  Admitted by [facility], hospitalist service.   Assessment and Plan:  Closed displaced fracture right femoral neck post mechanical fall Pain control as needed Postop s/p right hip hemiarthroplasty 12/05/2024 with Dr. Margrette Continue postop care PT recommending SNF rehab placement  [facility] consulted to assist with SNF placement At DC plan DVT prevention with aspirin  81 mg BID x 6 weeks   CKD 3B Creatinine improved with hydration Gentle IV fluid hydration completed  Monitoring urine output Avoid hepatotoxic agents, dehydration, and hypotension Repeat BMP was stable   Leukocytosis -- reactive from surgery no s/s of infection found at this time   Hyperlipidemia Resumed home simvastatin   Elevated BPs and tachycardia -- IV lopressor  DC'd due to soft BPs -- monitoring, treating supportively  -- elevated BPs resolved now, likely was from postop pain   OSA Resume home CPAP   Obesity BMI 34 Recommend weight loss outpatient regular physical activity and healthy dieting.  DVT prophylaxis: SCDs Code Status: Full  Family Communication:  Disposition:  SNF pending   Consultants:  ortho  Procedures:  Right hip hemiarthroplasty 12/05/24  Antimicrobials:    Subjective: Pt reports she is more sore today but pain meds are helping.   Objective: Vitals:   12/08/24 2015 12/09/24 0504 12/09/24 0825 12/09/24 1233  BP: (!) 121/52 127/64 (!) 124/44 127/70  Pulse: (!) 104 95 95 (!) 105  Resp:      Temp: 98.9 F (37.2 C) 97.8 F (36.6 C) 98.6 F (37 C) 98.3 F (36.8 C)  TempSrc: Oral Oral Oral Oral  SpO2: 98% 100% 100% 99%  Weight:      Height:        Intake/Output Summary (Last 24 hours) at 12/09/2024 1245 Last data filed at 12/09/2024 0520 Gross per 24 hour  Intake 240 ml  Output 950 ml  Net -710 ml   Filed Weights   12/04/24 1857 12/04/24 2239  Weight: 84.9 kg 85.5 kg   Examination:  General exam: Appears calm and comfortable  Respiratory system: Clear to auscultation. Respiratory effort normal. Cardiovascular system: tachycardic, normal S1 & S2 heard. No JVD, murmurs, rubs, gallops or clicks. No pedal edema. Gastrointestinal system: Abdomen is nondistended, soft and nontender. No organomegaly or masses felt. Normal bowel sounds heard. Central nervous system: Alert and oriented. No focal neurological deficits. Extremities: bandages clean dry intact. Skin: No rashes, lesions or ulcers. Psychiatry: Judgement and insight appear normal. Mood & affect appropriate.   Data Reviewed: I have personally reviewed following labs and imaging studies  CBC: Recent Labs  Lab 12/04/24 1959  12/05/24 0529 12/06/24 0444 12/07/24 0548 12/08/24 0542  WBC 10.3 8.0 10.9* 13.0* 10.6*  NEUTROABS 6.2  --   --   --  8.0*  HGB 13.1 12.8 12.0 12.2 11.1*  HCT 42.0 41.2 37.4 38.4 34.6*  MCV 88.6 88.6 87.6 88.1 86.5  PLT 304 247 208 207 202    Basic Metabolic Panel: Recent Labs  Lab 12/04/24 1959 12/05/24 0529 12/06/24 0444 12/07/24 0548 12/08/24 0542  NA 143 138 134* 130* 134*  K 4.6 4.5 4.4 4.7 4.4  CL 104 103 100 95* 100  CO2 32  27 25 22 26   GLUCOSE 93 109* 120* 119* 118*  BUN 25* 19 10 17 20   CREATININE 1.43* 1.08* 0.91 1.38* 1.13*  CALCIUM 8.8* 8.5* 8.0* 8.4* 8.3*  MG  --  2.7* 1.7 2.7*  --   PHOS  --  3.1  --   --   --     CBG: No results for input(s): GLUCAP in the last 168 hours.  Recent Results (from the past 240 hours)  Surgical pcr screen     Status: Abnormal   Collection Time: 12/05/24 10:37 AM   Specimen: Nasal Mucosa; Nasal Swab  Result Value Ref Range Status   MRSA, PCR NEGATIVE NEGATIVE Final   Staphylococcus aureus POSITIVE (A) NEGATIVE Final    Comment: RESULT CALLED TO, READ BACK BY AND VERIFIED WITH: CASSIDY GANOE @ 1231 ON 12/05/24 C VARNER (NOTE) The Xpert SA Assay (FDA approved for NASAL specimens in patients 23 years of age and older), is one component of a comprehensive surveillance program. It is not intended to diagnose infection nor to guide or monitor treatment. Performed at [facility], [address], [place], [place] [address]      Radiology Studies: No results found.   Scheduled Meds:  cholecalciferol   1,000 Units Oral Daily   docusate sodium   100 mg Oral BID   enoxaparin  (LOVENOX ) injection  40 mg Subcutaneous Q24H   gabapentin   300 mg Oral BID   iron  polysaccharides  150 mg Oral Daily   mupirocin  ointment  1 Application Nasal BID   omega-3 acid ethyl esters  1 g Oral BID   simvastatin   20 mg Oral QHS   Continuous Infusions:   LOS: 5 days   Time spent: 55 mins  Kathryn Holloman Vicci, MD How to contact the [facility] Attending or Consulting provider 7A - 7P or covering provider during after hours 7P -7A, for this patient?  Check the care team in [facility] and look for a) attending/consulting TRH provider listed and b) the TRH team listed Log into [email or web address] to find provider on call.  Locate the TRH provider you are looking for under [facility] and page to a number that you can be directly reached. If you still have difficulty reaching the provider, please page the  [facility] (Director on Call) for the Hospitalists listed on amion for assistance.  12/09/2024, 12:45 PM    "

## 2024-12-09 NOTE — TOC Progression Note (Signed)
 Transition of Care [facility]) - Progression Note    Patient Details  Name: Kathryn Lynn MRN: [id number] Date of Birth: 25-Feb-1951  [facility] CM/SW Contact  [place] Saddie [place], [place] Phone Number: 12/09/2024, 3:17 PM  Clinical Narrative:  SNF shara is pending. TOC will follow.        Barriers to Discharge: Insurance Authorization               Expected Discharge Plan and Services                                               Social Drivers of Health (SDOH) Interventions SDOH Screenings   Food Insecurity: No Food Insecurity (12/04/2024)  Housing: Low Risk (12/04/2024)  Transportation Needs: No Transportation Needs (12/04/2024)  Utilities: Not At Risk (12/04/2024)  Alcohol Screen: Low Risk (05/01/2024)  Depression (PHQ2-9): Low Risk (06/24/2024)  Financial Resource Strain: Low Risk (05/01/2024)  Physical Activity: Inactive (05/01/2024)  Social Connections: Moderately Integrated (12/04/2024)  Stress: No Stress Concern Present (05/01/2024)  Tobacco Use: Low Risk (12/05/2024)  Health Literacy: Adequate Health Literacy (05/01/2024)    Readmission Risk Interventions    12/05/2024    8:55 AM  Readmission Risk Prevention Plan  Post Dischage Appt Complete  Medication Screening Complete  Transportation Screening Complete

## 2024-12-09 NOTE — Progress Notes (Signed)
" °   12/09/24 1950  BiPAP/CPAP/SIPAP  Reason BIPAP/CPAP not in use Non-compliant   Patient non compliant with CPAP. No unit in room at this time. "

## 2024-12-09 NOTE — Progress Notes (Signed)
 Nurse at bedside,patient assisted from chair to bed,using front wheel walker times one assist. Patient tolerated transfer. Call bell in reach,bed alarm set. Plan of care on going.

## 2024-12-10 DIAGNOSIS — G4733 Obstructive sleep apnea (adult) (pediatric): Secondary | ICD-10-CM | POA: Diagnosis not present

## 2024-12-10 DIAGNOSIS — N1832 Chronic kidney disease, stage 3b: Secondary | ICD-10-CM | POA: Diagnosis not present

## 2024-12-10 DIAGNOSIS — S72001A Fracture of unspecified part of neck of right femur, initial encounter for closed fracture: Secondary | ICD-10-CM | POA: Diagnosis not present

## 2024-12-10 MED ORDER — GABAPENTIN 300 MG PO CAPS: 300.0000 mg | ORAL_CAPSULE | Freq: Two times a day (BID) | ORAL | Status: DC

## 2024-12-10 MED ORDER — ACETAMINOPHEN 325 MG PO TABS: 325.0000 mg | ORAL_TABLET | Freq: Four times a day (QID) | ORAL | Status: AC | PRN

## 2024-12-10 MED ORDER — DICLOFENAC SODIUM 1 % EX GEL: 2.0000 g | Freq: Four times a day (QID) | CUTANEOUS | Status: AC | PRN

## 2024-12-10 MED ORDER — OXYCODONE HCL 5 MG PO TABS: 5.0000 mg | ORAL_TABLET | Freq: Four times a day (QID) | ORAL | 0 refills | Status: DC | PRN

## 2024-12-10 MED ORDER — ASPIRIN 81 MG PO TBEC: 81.0000 mg | DELAYED_RELEASE_TABLET | Freq: Two times a day (BID) | ORAL | Status: DC

## 2024-12-10 MED ORDER — METOPROLOL TARTRATE 25 MG PO TABS
12.5000 mg | ORAL_TABLET | ORAL | Status: AC
  Administered 2024-12-10: 12.5 mg via ORAL
  Filled 2024-12-10 (×2): qty 1

## 2024-12-10 MED ORDER — DSS 100 MG PO CAPS: 100.0000 mg | ORAL_CAPSULE | Freq: Two times a day (BID) | ORAL | Status: AC

## 2024-12-10 MED ORDER — POLYSACCHARIDE IRON COMPLEX 150 MG PO CAPS: 150.0000 mg | ORAL_CAPSULE | Freq: Every day | ORAL | Status: DC

## 2024-12-10 NOTE — Discharge Summary (Signed)
 Physician Discharge Summary  Kathryn Lynn FMW:[id number] DOB: Aug 08, 1951 DOA: 12/04/2024  PCP: Catherine Charlies LABOR, DO Orthopedics: Dr. Margrette   Admit date: 12/04/2024 Discharge date: 12/10/2024  Disposition:  SNF   Recommendations for Outpatient Follow-up:  Follow up with Dr Margrette orthopedics in 4 weeks Continue aspirin  81 mg BID x 6 weeks for DVT prevention Please obtain BMP/CBC in 1 week  Ortho instructions Weightbearing as tolerated Direct lateral hip precautions DVT prophylaxis for 6 weeks Postop appointment scheduled for 28 days  Home Health:  SNF  Discharge Condition: STABLE   CODE STATUS: FULL DIET: regular    Brief Hospitalization Summary: Please see all hospital notes, images, labs for full details of the hospitalization. 74 y.o. female with medical history significant for OSA on CPAP, obesity, hyperlipidemia, CKD 3B, previous back surgery, who presented to the ER after a mechanical fall.  The patient reports she tripped over a rug causing her to fall.  She was in her usual state of health prior to this.  Endorses severe pain in her right hip.  EMS was activated.  The patient was brought into the ER for further evaluation.   In the ER, vital signs are stable.  Right hip x-ray revealed right femoral neck fracture with varus deformity.     EDP discussed the case with orthopedic surgery.  Recommended admission by medicine team.  She was taken to OR on 12/05/24.  Admitted by [facility], hospitalist service.  Hospital Course by listed problems addressed   Closed displaced fracture right femoral neck post mechanical fall Pain control as needed Postop s/p right hip hemiarthroplasty 12/05/2024 with Dr. Margrette Continue postop care PT recommending SNF rehab placement  [facility] consulted to assist with SNF placement At DC plan DVT prevention with aspirin  81 mg BID x 6 weeks Ortho instructions:  FU Ortho in 4 weeks DVT aspirin  x 6 weeks   CKD 3B Creatinine improved with  hydration Gentle IV fluid hydration completed  Monitoring urine output Avoid hepatotoxic agents, dehydration, and hypotension Repeat BMP was stable    Leukocytosis -- reactive from surgery no s/s of infection found at this time   Hyperlipidemia Resumed home simvastatin    Elevated BPs and tachycardia -- IV lopressor  DC'd due to soft BPs -- monitoring, treating supportively  -- elevated BPs resolved now, likely was from postop pain   OSA Resume home CPAP   Obesity BMI 34 Recommend weight loss outpatient regular physical activity and healthy dieting.    Discharge Diagnoses:  Principal Problem:   Closed displaced fracture of right femoral neck [facility])   Discharge Instructions:  Allergies as of 12/10/2024       Reactions   Adhesive [tape] Rash   Cephalosporins Rash   Clindamycin Palpitations   Latex Rash   Penicillins Rash        Medication List     TAKE these medications    acetaminophen  325 MG tablet Commonly known as: TYLENOL  Take 1-2 tablets (325-650 mg total) by mouth every 6 (six) hours as needed for mild pain (pain score 1-3) or fever (or temp > 100.5).   aspirin  EC 81 MG tablet Take 1 tablet (81 mg total) by mouth 2 (two) times daily. Swallow whole. Start taking on: December 11, 2024   CALCIUM 600 + D PO Take 1 tablet by mouth daily.   cetirizine 10 MG tablet Commonly known as: ZYRTEC Take 10 mg by mouth daily.   chlorhexidine  4 % external liquid Commonly known as: HIBICLENS  Apply 15 mLs (  1 Application total) topically as directed for 30 doses. Use as directed daily for 5 days every other week for 6 weeks.   cimetidine 200 MG tablet Commonly known as: TAGAMET Take 200 mg by mouth 2 (two) times daily.   diclofenac  Sodium 1 % Gel Commonly known as: Voltaren  Apply 2-4 g topically 4 (four) times daily as needed.   DSS 100 MG Caps Take 1 capsule (100 mg total) by mouth in the morning and at bedtime. What changed: when to take this   fluticasone   50 MCG/ACT nasal spray Commonly known as: FLONASE  Place 1 spray into both nostrils daily as needed for allergies.   gabapentin  300 MG capsule Commonly known as: NEURONTIN  Take 1 capsule (300 mg total) by mouth 2 (two) times daily.   iron  polysaccharides 150 MG capsule Commonly known as: Ferrex 150 Take 1 capsule (150 mg total) by mouth daily. What changed: See the new instructions.   magnesium  oxide 400 (240 Mg) MG tablet Commonly known as: MAG-OX Take 400 mg by mouth daily.   meclizine  25 MG tablet Commonly known as: ANTIVERT  Take 25 mg by mouth 3 (three) times daily as needed for dizziness.   Melatonin 10 MG Tabs Take 10 mg by mouth at bedtime as needed (sleep).   mupirocin  ointment 2 % Commonly known as: BACTROBAN  Place 1 Application into the nose 2 (two) times daily for 60 doses. Use as directed 2 times daily for 5 days every other week for 6 weeks.   Omega-3 Fish Oil  300 MG Caps Take 300 mg by mouth daily in the afternoon.   oxyCODONE  5 MG immediate release tablet Commonly known as: Oxy IR/ROXICODONE  Take 1-2 tablets (5-10 mg total) by mouth every 6 (six) hours as needed for moderate pain (pain score 4-6), severe pain (pain score 7-10) or breakthrough pain.   simvastatin  20 MG tablet Commonly known as: ZOCOR  Take 1 tablet (20 mg total) by mouth at bedtime.   Vitamin D  (Cholecalciferol ) 25 MCG (1000 UT) Caps Take 1,000 Units by mouth daily.   vitamin E 180 MG (400 UNITS) capsule Take 400 Units by mouth daily.        Contact information for follow-up providers     Margrette Taft BRAVO, MD. Schedule an appointment as soon as possible for a visit in 4 week(s).   Specialties: Orthopedic Surgery, Radiology Why: Hospital Follow Up Contact information: [address] [place] [address] [phone]              Contact information for after-discharge care     Destination     [facility] .   Service: Skilled Nursing Contact  information: [address] [place]  [address] [phone]                    Allergies[1] Allergies as of 12/10/2024       Reactions   Adhesive [tape] Rash   Cephalosporins Rash   Clindamycin Palpitations   Latex Rash   Penicillins Rash        Medication List     TAKE these medications    acetaminophen  325 MG tablet Commonly known as: TYLENOL  Take 1-2 tablets (325-650 mg total) by mouth every 6 (six) hours as needed for mild pain (pain score 1-3) or fever (or temp > 100.5).   aspirin  EC 81 MG tablet Take 1 tablet (81 mg total) by mouth 2 (two) times daily. Swallow whole. Start taking on: December 11, 2024  CALCIUM 600 + D PO Take 1 tablet by mouth daily.   cetirizine 10 MG tablet Commonly known as: ZYRTEC Take 10 mg by mouth daily.   chlorhexidine  4 % external liquid Commonly known as: HIBICLENS  Apply 15 mLs (1 Application total) topically as directed for 30 doses. Use as directed daily for 5 days every other week for 6 weeks.   cimetidine 200 MG tablet Commonly known as: TAGAMET Take 200 mg by mouth 2 (two) times daily.   diclofenac  Sodium 1 % Gel Commonly known as: Voltaren  Apply 2-4 g topically 4 (four) times daily as needed.   DSS 100 MG Caps Take 1 capsule (100 mg total) by mouth in the morning and at bedtime. What changed: when to take this   fluticasone  50 MCG/ACT nasal spray Commonly known as: FLONASE  Place 1 spray into both nostrils daily as needed for allergies.   gabapentin  300 MG capsule Commonly known as: NEURONTIN  Take 1 capsule (300 mg total) by mouth 2 (two) times daily.   iron  polysaccharides 150 MG capsule Commonly known as: Ferrex 150 Take 1 capsule (150 mg total) by mouth daily. What changed: See the new instructions.   magnesium  oxide 400 (240 Mg) MG tablet Commonly known as: MAG-OX Take 400 mg by mouth daily.   meclizine  25 MG tablet Commonly known as: ANTIVERT  Take 25 mg by mouth 3 (three)  times daily as needed for dizziness.   Melatonin 10 MG Tabs Take 10 mg by mouth at bedtime as needed (sleep).   mupirocin  ointment 2 % Commonly known as: BACTROBAN  Place 1 Application into the nose 2 (two) times daily for 60 doses. Use as directed 2 times daily for 5 days every other week for 6 weeks.   Omega-3 Fish Oil  300 MG Caps Take 300 mg by mouth daily in the afternoon.   oxyCODONE  5 MG immediate release tablet Commonly known as: Oxy IR/ROXICODONE  Take 1-2 tablets (5-10 mg total) by mouth every 6 (six) hours as needed for moderate pain (pain score 4-6), severe pain (pain score 7-10) or breakthrough pain.   simvastatin  20 MG tablet Commonly known as: ZOCOR  Take 1 tablet (20 mg total) by mouth at bedtime.   Vitamin D  (Cholecalciferol ) 25 MCG (1000 UT) Caps Take 1,000 Units by mouth daily.   vitamin E 180 MG (400 UNITS) capsule Take 400 Units by mouth daily.        Procedures/Studies: DG HIP UNILAT WITH PELVIS 2-3 VIEWS RIGHT Result Date: 12/05/2024 CLINICAL DATA:  Status post hip replacement. EXAM: DG HIP (WITH OR WITHOUT PELVIS) 2-3V RIGHT COMPARISON:  Preoperative imaging FINDINGS: Right hip hemiarthroplasty in expected alignment. No periprosthetic lucency or fracture. Recent postsurgical change includes air and edema in the soft tissues. Lumbo iliac fusion hardware is partially included in the field of view. IMPRESSION: Right hip hemiarthroplasty without immediate postoperative complication. Electronically Signed   By: Andrea Gasman M.D.   On: 12/05/2024 17:34   DG Hip Unilat W or Wo Pelvis 2-3 Views Right Result Date: 12/04/2024 EXAM: 2 or more VIEW(S) XRAY OF THE RIGHT HIP 12/04/2024 07:23:00 PM COMPARISON: None available. CLINICAL HISTORY: fall injury FINDINGS: BONES AND JOINTS: Right femoral neck fracture with varus deformity. LUMBAR SPINE: Posterior lumbar decompression, bilateral laminectomy, bilateral pedicle screw and rod fixation of the visualized lumbar spine  noted. SOFT TISSUES: The soft tissues are unremarkable. IMPRESSION: 1. Right femoral neck fracture with varus deformity. Electronically signed by: Greig Pique MD 12/04/2024 08:35 PM EST RP Workstation: [id number]  Subjective: Pt sitting up in chair, pain much better today, agreeable to SNF rehab therapy   Discharge Exam: Vitals:   12/10/24 0739 12/10/24 1058  BP: 132/67 (!) 117/58  Pulse: (!) 117 87  Resp: 19 19  Temp: 98.4 F (36.9 C) 98.3 F (36.8 C)  SpO2: 97% 95%   Vitals:   12/09/24 2142 12/10/24 0529 12/10/24 0739 12/10/24 1058  BP: (!) 141/60 (!) 145/72 132/67 (!) 117/58  Pulse: (!) 102 (!) 113 (!) 117 87  Resp: 19 20 19 19   Temp: 98.1 F (36.7 C) 98.1 F (36.7 C) 98.4 F (36.9 C) 98.3 F (36.8 C)  TempSrc: Oral Oral Oral Oral  SpO2: 97% 95% 97% 95%  Weight:      Height:        General: Pt is alert, awake, not in acute distress Cardiovascular: RRR, S1/S2 +, no rubs, no gallops Respiratory: CTA bilaterally, no wheezing, no rhonchi Abdominal: Soft, NT, ND, bowel sounds + Extremities: bandages clean, dry, intact.    The results of significant diagnostics from this hospitalization (including imaging, microbiology, ancillary and laboratory) are listed below for reference.     Microbiology: Recent Results (from the past 240 hours)  Surgical pcr screen     Status: Abnormal   Collection Time: 12/05/24 10:37 AM   Specimen: Nasal Mucosa; Nasal Swab  Result Value Ref Range Status   MRSA, PCR NEGATIVE NEGATIVE Final   Staphylococcus aureus POSITIVE (A) NEGATIVE Final    Comment: RESULT CALLED TO, READ BACK BY AND VERIFIED WITH: CASSIDY GANOE @ 1231 ON 12/05/24 C VARNER (NOTE) The Xpert SA Assay (FDA approved for NASAL specimens in patients 86 years of age and older), is one component of a comprehensive surveillance program. It is not intended to diagnose infection nor to guide or monitor treatment. Performed at [facility], [address], [place], [place]  [address]      Labs: BNP (last 3 results) No results for input(s): BNP in the last 8760 hours. Basic Metabolic Panel: Recent Labs  Lab 12/04/24 1959 12/05/24 0529 12/06/24 0444 12/07/24 0548 12/08/24 0542  NA 143 138 134* 130* 134*  K 4.6 4.5 4.4 4.7 4.4  CL 104 103 100 95* 100  CO2 32 27 25 22 26   GLUCOSE 93 109* 120* 119* 118*  BUN 25* 19 10 17 20   CREATININE 1.43* 1.08* 0.91 1.38* 1.13*  CALCIUM 8.8* 8.5* 8.0* 8.4* 8.3*  MG  --  2.7* 1.7 2.7*  --   PHOS  --  3.1  --   --   --    Liver Function Tests: Recent Labs  Lab 12/04/24 1959  AST 18  ALT 14  ALKPHOS 51  BILITOT 0.6  PROT 6.7  ALBUMIN 4.3   No results for input(s): LIPASE, AMYLASE in the last 168 hours. No results for input(s): AMMONIA in the last 168 hours. CBC: Recent Labs  Lab 12/04/24 1959 12/05/24 0529 12/06/24 0444 12/07/24 0548 12/08/24 0542  WBC 10.3 8.0 10.9* 13.0* 10.6*  NEUTROABS 6.2  --   --   --  8.0*  HGB 13.1 12.8 12.0 12.2 11.1*  HCT 42.0 41.2 37.4 38.4 34.6*  MCV 88.6 88.6 87.6 88.1 86.5  PLT 304 247 208 207 202   Cardiac Enzymes: No results for input(s): CKTOTAL, CKMB, CKMBINDEX, TROPONINI in the last 168 hours. BNP: Invalid input(s): POCBNP CBG: No results for input(s): GLUCAP in the last 168 hours. D-Dimer No results for input(s): DDIMER in the last 72 hours.  Hgb A1c No results for input(s): HGBA1C in the last 72 hours. Lipid Profile No results for input(s): CHOL, HDL, LDLCALC, TRIG, CHOLHDL, LDLDIRECT in the last 72 hours. Thyroid  function studies No results for input(s): TSH, T4TOTAL, T3FREE, THYROIDAB in the last 72 hours.  Invalid input(s): FREET3 Anemia work up No results for input(s): VITAMINB12, FOLATE, FERRITIN, TIBC, IRON , RETICCTPCT in the last 72 hours. Urinalysis    Component Value Date/Time   COLORURINE YELLOW 04/28/2020 0927   APPEARANCEUR CLEAR 04/28/2020 0927   LABSPEC 1.013 04/28/2020 0927    PHURINE 6.0 04/28/2020 0927   GLUCOSEU NEGATIVE 04/28/2020 0927   HGBUR NEGATIVE 04/28/2020 0927   BILIRUBINUR negative 11/18/2019 1408   KETONESUR NEGATIVE 04/28/2020 0927   PROTEINUR NEGATIVE 04/28/2020 0927   UROBILINOGEN 0.2 11/18/2019 1408   UROBILINOGEN 0.2 03/30/2012 0015   NITRITE negative 11/18/2019 1408   NITRITE NEGATIVE 03/30/2012 0015   LEUKOCYTESUR Negative 11/18/2019 1408   Sepsis Labs Recent Labs  Lab 12/05/24 0529 12/06/24 0444 12/07/24 0548 12/08/24 0542  WBC 8.0 10.9* 13.0* 10.6*   Microbiology Recent Results (from the past 240 hours)  Surgical pcr screen     Status: Abnormal   Collection Time: 12/05/24 10:37 AM   Specimen: Nasal Mucosa; Nasal Swab  Result Value Ref Range Status   MRSA, PCR NEGATIVE NEGATIVE Final   Staphylococcus aureus POSITIVE (A) NEGATIVE Final    Comment: RESULT CALLED TO, READ BACK BY AND VERIFIED WITH: CASSIDY GANOE @ 1231 ON 12/05/24 C VARNER (NOTE) The Xpert SA Assay (FDA approved for NASAL specimens in patients 69 years of age and older), is one component of a comprehensive surveillance program. It is not intended to diagnose infection nor to guide or monitor treatment. Performed at [facility], [address], [place], [place] [address]    Time coordinating discharge: 32 mins  SIGNED:  Afton Louder, MD  [facility] 12/10/2024, 12:16 PM How to contact the [facility] Attending or Consulting provider 7A - 7P or covering provider during after hours 7P -7A, for this patient?  Check the care team in [facility] and look for a) attending/consulting TRH provider listed and b) the TRH team listed Log into [email or web address] and use [facility]'s universal password to access. If you do not have the password, please contact the hospital operator. Locate the TRH provider you are looking for under [facility] and page to a number that you can be directly reached. If you still have difficulty reaching the provider, please page the [facility]  (Director on Call) for the Hospitalists listed on amion for assistance.     [1]  Allergies Allergen Reactions   Adhesive [Tape] Rash   Cephalosporins Rash   Clindamycin Palpitations   Latex Rash   Penicillins Rash

## 2024-12-10 NOTE — Progress Notes (Signed)
 Physical Therapy Treatment Patient Details Name: Kathryn Lynn MRN: [id number] DOB: 28-Apr-1951 Today's Date: 12/10/2024   History of Present Illness Kathryn Lynn is a 74 y.o. female s/p Right hip hemiarthroplasty on 12/07/23 with medical history significant for OSA on CPAP, obesity, hyperlipidemia, CKD 3B, previous back surgery, who presented to the ER after a mechanical fall.  The patient reports she tripped over a rug causing her to fall.  She was in her usual state of health prior to this.  Endorses severe pain in her right hip.  EMS was activated.  The patient was brought into the ER for further evaluation.     In the ER, vital signs are stable.  Right hip x-ray revealed right femoral neck fracture with varus deformity.       The patient received IV opiate analgesics.    PT Comments  Patient agreeable to PT treatment. Pt was received sitting in recliner at start of session. Began with LE strengthening exercises in seated position with pt tolerating well. Min assist for STS from chair using RW. Progressed to a few standing LE exercises with RW for support and CGA for safety. Given verbal cues to limit motion to pain free ranges. Pt given print off of HEP for seated and supine positions. Pt increased gait distance this date but remains limited due to fatigue and pain. Pt returned to bed at end of session, alarm set, all needs met, and call button in reach. Patient will benefit from continued skilled physical therapy acutely and in recommended venue in order to address current deficits and improve overall function.     If plan is discharge home, recommend the following: A lot of help with bathing/dressing/bathroom;A lot of help with walking and/or transfers;Help with stairs or ramp for entrance;Assist for transportation;Assistance with cooking/housework   Can travel by private vehicle        Equipment Recommendations  None recommended by PT    Recommendations for Other Services        Precautions / Restrictions Precautions Precautions: Fall Recall of Precautions/Restrictions: Intact Restrictions Weight Bearing Restrictions Per Provider Order: Yes RLE Weight Bearing Per Provider Order: Weight bearing as tolerated     Mobility  Bed Mobility Overal bed mobility: Needs Assistance Bed Mobility: Sit to Supine       Sit to supine: Contact guard assist, Min assist   General bed mobility comments: pt received sitting in recliner at start of session. Returns to bed at end of session, requiring min/CGA with HOB flat, inc time needed due to slow labored movement    Transfers Overall transfer level: Needs assistance Equipment used: Rolling walker (2 wheels) Transfers: Sit to/from Stand Sit to Stand: Min assist           General transfer comment: Min assist with 1 STS from chair with RW for support, inc time needed secondary to R hip pain with mobility    Ambulation/Gait Ambulation/Gait assistance: Min assist Gait Distance (Feet): 30 Feet Assistive device: Rolling walker (2 wheels) Gait Pattern/deviations: Decreased step length - left, Decreased stance time - right, Decreased stride length, Antalgic, Decreased step length - right, Trunk flexed Gait velocity: Dec     General Gait Details: pt ambulates in room and slightly into hall with RW and min assist, most assist during turns for safety, cont slow labored movement with dec WB into RLE, limited by fatigue and pain but imrpovements in activity tolerance of note this date   Stairs  Wheelchair Mobility     Tilt Bed    Modified Rankin (Stroke Patients Only)       Balance Overall balance assessment: Needs assistance Sitting-balance support: Feet supported, No upper extremity supported Sitting balance-Leahy Scale: Fair Sitting balance - Comments: fair/good seated at EOB   Standing balance support: Reliant on assistive device for balance, During functional activity, Bilateral upper  extremity supported Standing balance-Leahy Scale: Fair Standing balance comment: fair/poor using RW                            Communication Communication Communication: No apparent difficulties  Cognition Arousal: Alert Behavior During Therapy: WFL for tasks assessed/performed   PT - Cognitive impairments: No apparent impairments                         Following commands: Intact      Cueing Cueing Techniques: Verbal cues, Tactile cues, Visual cues  Exercises General Exercises - Lower Extremity Long Arc Quad: Seated, AROM, Strengthening, Both, 10 reps Hip ABduction/ADduction: AROM, Right, Strengthening, 10 reps, Standing Hip Flexion/Marching: AROM, Strengthening, Both, 10 reps, Standing Toe Raises: Seated, AROM, Strengthening, Both, 10 reps Heel Raises: Seated, AROM, Strengthening, Both, 10 reps Other Exercises Other Exercises: Standing hip extension for glute engagement, small motion, 10 reps, LLE only, RW for support    General Comments        Pertinent Vitals/Pain Pain Assessment Pain Assessment: Faces Faces Pain Scale: Hurts a little bit Pain Location: on L side/flank area Pain Descriptors / Indicators: Discomfort, Sore Pain Intervention(s): Limited activity within patient's tolerance, Monitored during session, Repositioned    Home Living                          Prior Function            PT Goals (current goals can now be found in the care plan section) Acute Rehab PT Goals Patient Stated Goal: return home after rehab PT Goal Formulation: With patient Time For Goal Achievement: 12/20/24 Potential to Achieve Goals: Good Progress towards PT goals: Progressing toward goals    Frequency    Min 4X/week      PT Plan      Co-evaluation              AM-PAC PT 6 Clicks Mobility   Outcome Measure  Help needed turning from your back to your side while in a flat bed without using bedrails?: A Little Help needed  moving from lying on your back to sitting on the side of a flat bed without using bedrails?: A Little Help needed moving to and from a bed to a chair (including a wheelchair)?: A Little Help needed standing up from a chair using your arms (e.g., wheelchair or bedside chair)?: A Little Help needed to walk in hospital room?: A Little Help needed climbing 3-5 steps with a railing? : A Lot 6 Click Score: 17    End of Session Equipment Utilized During Treatment: Gait belt Activity Tolerance: Patient tolerated treatment well;Patient limited by fatigue;Patient limited by pain Patient left: with call bell/phone within reach;in bed;with bed alarm set Nurse Communication: Mobility status PT Visit Diagnosis: Unsteadiness on feet (R26.81);Other abnormalities of gait and mobility (R26.89);Muscle weakness (generalized) (M62.81)     Time: 9067-9053 PT Time Calculation (min) (ACUTE ONLY): 14 min  Charges:    $Therapeutic Exercise: 8-22 mins PT General  Charges $$ ACUTE PT VISIT: 1 Visit                     12:40 PM, 12/10/2024 Rosaria Settler, PT, DPT [facility]

## 2024-12-10 NOTE — Plan of Care (Signed)

## 2024-12-10 NOTE — Progress Notes (Signed)
" °   12/10/24 0739  Vitals  Temp 98.4 F (36.9 C)  Temp Source Oral  BP 132/67  MAP (mmHg) 86  BP Location Left Arm  BP Method Automatic  Patient Position (if appropriate) Lying  Pulse Rate (!) 117  Pulse Rate Source Dinamap  Resp 19  Level of Consciousness  Level of Consciousness Alert  MEWS COLOR  MEWS Score Color Yellow  Oxygen Therapy  SpO2 97 %  O2 Device Room Air  Pain Assessment  Pain Scale 0-10  Pain Score 2  Pain Type Acute pain  Pain Location Shoulder  Pain Orientation Left  Pain Descriptors / Indicators Aching;Discomfort  Pain Frequency Constant  Pain Onset On-going  Patients Stated Pain Goal 0  Pain Intervention(s) Refused  MEWS Score  MEWS Temp 0  MEWS Systolic 0  MEWS Pulse 2  MEWS RR 0  MEWS LOC 0  MEWS Score 2  Provider Notification  Provider Name/Title Dr Claudetta Louder  Date Provider Notified 12/10/24  Time Provider Notified [phone]  Method of Notification Page  Notification Reason Critical Result (hr 117)  Test performed and critical result vital signs  Date Critical Result Received 12/10/24  Time Critical Result Received 0739  Provider response No new orders  Date of Provider Response 12/10/24  Time of Provider Response 0749    "

## 2024-12-10 NOTE — Care Management Important Message (Signed)
 Important Message  Patient Details  Name: Kathryn Lynn MRN: [id number] Date of Birth: 06/11/1951   Important Message Given:  Yes - Medicare IM     Alejandro Adcox L Asta Corbridge 12/10/2024, 2:31 PM

## 2024-12-10 NOTE — TOC Transition Note (Signed)
 Transition of Care [facility]) - Discharge Note   Patient Details  Name: Kathryn Lynn MRN: [id number] Date of Birth: 1951-04-01  [facility] CM/SW Contact:  Mcarthur Saddie Kim, LCSW Phone Number: 12/10/2024, 2:04 PM   Clinical Narrative:  Pt d/c today to [facility] Pt and facility aware and agreeable. Pt states she will notify family. Authorization received. Pt will transfer with staff. RN given number to call report. D/C summary sent to SNF.      Final next level of care: Skilled Nursing Facility Barriers to Discharge: Barriers Resolved   Patient Goals and CMS Choice     Choice offered to / list presented to : Patient      Discharge Placement              Patient chooses bed at: [facility] Patient to be transferred to facility by: staff Name of family member notified: pt will notify family Patient and family notified of of transfer: 12/10/24  Discharge Plan and Services Additional resources added to the After Visit Summary for                                       Social Drivers of Health (SDOH) Interventions SDOH Screenings   Food Insecurity: No Food Insecurity (12/04/2024)  Housing: Low Risk (12/04/2024)  Transportation Needs: No Transportation Needs (12/04/2024)  Utilities: Not At Risk (12/04/2024)  Alcohol Screen: Low Risk (05/01/2024)  Depression (PHQ2-9): Low Risk (06/24/2024)  Financial Resource Strain: Low Risk (05/01/2024)  Physical Activity: Inactive (05/01/2024)  Social Connections: Moderately Integrated (12/04/2024)  Stress: No Stress Concern Present (05/01/2024)  Tobacco Use: Low Risk (12/05/2024)  Health Literacy: Adequate Health Literacy (05/01/2024)     Readmission Risk Interventions    12/05/2024    8:55 AM  Readmission Risk Prevention Plan  Post Dischage Appt Complete  Medication Screening Complete  Transportation Screening Complete

## 2024-12-10 NOTE — Progress Notes (Signed)
 Nurse at bedside,patient alert and oriented times four.Patient's Blood pressure was 132/67,heart rate 117,Dr Clanford Johnson notified.Plan of care on going.

## 2024-12-10 NOTE — Progress Notes (Signed)
 Patient discharged to [facility],report called and given to Devena RN. IV discontinued ,catheter intact.Accompanied by staff to awaiting floor.

## 2024-12-10 NOTE — Discharge Instructions (Addendum)
 Ortho instructions: FU Ortho in 4 weeks DVT aspirin  x 6 weeks   IMPORTANT INFORMATION: PAY CLOSE ATTENTION   PHYSICIAN DISCHARGE INSTRUCTIONS  Follow with Primary care provider  Catherine Charlies LABOR, DO  and other consultants as instructed by your Hospitalist Physician  SEEK MEDICAL CARE OR RETURN TO EMERGENCY ROOM IF SYMPTOMS COME BACK, WORSEN OR NEW PROBLEM DEVELOPS   Please note: You were cared for by a hospitalist during your hospital stay. Every effort will be made to forward records to your primary care provider.  You can request that your primary care provider send for your hospital records if they have not received them.  Once you are discharged, your primary care physician will handle any further medical issues. Please note that NO REFILLS for any discharge medications will be authorized once you are discharged, as it is imperative that you return to your primary care physician (or establish a relationship with a primary care physician if you do not have one) for your post hospital discharge needs so that they can reassess your need for medications and monitor your lab values.  Please get a complete blood count and chemistry panel checked by your Primary MD at your next visit, and again as instructed by your Primary MD.  Get Medicines reviewed and adjusted: Please take all your medications with you for your next visit with your Primary MD  Laboratory/radiological data: Please request your Primary MD to go over all hospital tests and procedure/radiological results at the follow up, please ask your primary care provider to get all Hospital records sent to his/her office.  In some cases, they will be blood work, cultures and biopsy results pending at the time of your discharge. Please request that your primary care provider follow up on these results.  If you are diabetic, please bring your blood sugar readings with you to your follow up appointment with primary care.    Please call and  make your follow up appointments as soon as possible.    Also Note the following: If you experience worsening of your admission symptoms, develop shortness of breath, life threatening emergency, suicidal or homicidal thoughts you must seek medical attention immediately by calling 911 or calling your MD immediately  if symptoms less severe.  You must read complete instructions/literature along with all the possible adverse reactions/side effects for all the Medicines you take and that have been prescribed to you. Take any new Medicines after you have completely understood and accpet all the possible adverse reactions/side effects.   Do not drive when taking Pain medications or sleeping medications (Benzodiazepines)  Do not take more than prescribed Pain, Sleep and Anxiety Medications. It is not advisable to combine anxiety,sleep and pain medications without talking with your primary care practitioner  Special Instructions: If you have smoked or chewed Tobacco  in the last 2 yrs please stop smoking, stop any regular Alcohol  and or any Recreational drug use.  Wear Seat belts while driving.  Do not drive if taking any narcotic, mind altering or controlled substances or recreational drugs or alcohol.

## 2024-12-11 ENCOUNTER — Non-Acute Institutional Stay (SKILLED_NURSING_FACILITY): Payer: Self-pay | Admitting: Internal Medicine

## 2024-12-11 ENCOUNTER — Encounter: Payer: Self-pay | Admitting: Internal Medicine

## 2024-12-11 DIAGNOSIS — S72001A Fracture of unspecified part of neck of right femur, initial encounter for closed fracture: Secondary | ICD-10-CM

## 2024-12-11 DIAGNOSIS — R0789 Other chest pain: Secondary | ICD-10-CM | POA: Insufficient documentation

## 2024-12-11 DIAGNOSIS — N1832 Chronic kidney disease, stage 3b: Secondary | ICD-10-CM

## 2024-12-11 DIAGNOSIS — G4733 Obstructive sleep apnea (adult) (pediatric): Secondary | ICD-10-CM | POA: Diagnosis not present

## 2024-12-11 DIAGNOSIS — Z22321 Carrier or suspected carrier of Methicillin susceptible Staphylococcus aureus: Secondary | ICD-10-CM | POA: Insufficient documentation

## 2024-12-11 DIAGNOSIS — Z22322 Carrier or suspected carrier of Methicillin resistant Staphylococcus aureus: Secondary | ICD-10-CM | POA: Insufficient documentation

## 2024-12-11 NOTE — Assessment & Plan Note (Signed)
PT/OT at SNF as tolerated.  Orthopedic follow-up as scheduled. 

## 2024-12-11 NOTE — Assessment & Plan Note (Signed)
 Final creatinine 1.13 with a GFR of 51, compatible with CKD stage IIIa.  Med list reviewed; no indication for change in meds or dosages at this time.  Continue to monitor.

## 2024-12-11 NOTE — Patient Instructions (Signed)
 See assessment and plan under each diagnosis in the problem list and acutely for this visit

## 2024-12-11 NOTE — Assessment & Plan Note (Signed)
 Bactroban  intranasally twice daily x 5 days.

## 2024-12-11 NOTE — Assessment & Plan Note (Addendum)
 Zostrix gel twice daily ttopically will be employed.  D-dimer will be checked to rule out PTE to assess need for venous Doppler & CTA of chest.

## 2024-12-11 NOTE — Assessment & Plan Note (Addendum)
 She never employed CPAP.  Risk of dysrhythmias and associated CVA discussed with her.  Opioid related respiratory suppression risk in the context of OSA discussed.

## 2024-12-11 NOTE — Progress Notes (Addendum)
 "   NURSING HOME LOCATION:  [facility] ROOM NUMBER:  [id number]  CODE STATUS:  Full Code  PCP:  Charlies DELENA Bellini DO  This is a comprehensive admission note to this SNFperformed on this date less than 30 days from date of admission. Included are preadmission medical/surgical history; reconciled medication list; family history; social history and comprehensive review of systems.  Corrections and additions to the records were documented. Comprehensive physical exam was also performed. Additionally a clinical summary was entered for each active diagnosis pertinent to this admission in the Problem List to enhance continuity of care.  HPI: She was hospitalized 12/04/2024 - 12/10/2024 presenting to the ED after mechanical fall. History was she had tripped over a rug.  Imaging in the ED revealed right femoral neck fracture with varus deformity. Right hip arthroplasty was completed 12/05/2024 by Dr. [name].  Preop H/H was 13.1/42; final H/H was 11.1/34.6 with normochromic, normocytic indices.   DVT prophylaxis was to be enteric-coated aspirin  81 mg twice daily ending 01/20/2025. Mild leukocytosis was present with a peak white count of 13,000 attributed to perioperative stress.  MRSA PCR was negative but nasal swab was positive for Staph aureus. Intranasal  Bactroban  was ordered. Creatinine was 1.43 at presentation; nadir value was 0.91 and final value 1.13.  Initial GFR was 39; peak value was 54 and final values 51.  This would be compatible with stage IIIa CKD. Initial sodium was 143; nadir value was 130 and final value was 134.  Hypocalcemia was present with a final value of 8.3.  Mild hyperglycemia was noted with values ranging from a low of 93 up to a high of 120.  The most recent A1c was 6.2% on 08/13/2024. PT/OT consulted & recommended SNF placement for rehab.  Past medical and surgical history: Includes history of elevated bilirubin; OSA ; obesity; essential hypertension; GERD; mixed  dyslipidemia; and CKD. Surgeries and procedures include abdominal hysterectomy; lumbar fusion; BSO; carpal tunnel release bilaterally; cholecystectomy; colonoscopy; EGD; and shoulder arthroscopy with rotator cuff repair and subacromial decompression.  Family history: reviewed, non contributory due to advanced age.  Social history: Social drinker; non-smoker.   Review of systems: She confirms that she broke her hip in a mechanical fall.  She had bent over to straighten a rubber mat when she fell.  There was no cardiac or neurologic prodrome prior to the fall.  She continues to have some pain with increased mobilization such as PT/OT. She describes some dyspepsia.  She had constipation which has responded to as needed meds.  She uses Voltaren  gel on her knees and feet for arthritis pain. For the last 2 days she has had intermittent left mid axillary line pain worse with deep breath or twisting.  It can radiate to the L shoulder area.  She denies any anterior chest pain or shortness of breath. She states that she did a sleep study over 4 nights and had 12-13 events per hour.  She did not want to use CPAP because her husband had employed it and the noise had disrupted her sleep.  Constitutional: No fever, significant weight change, fatigue  Eyes: No redness, discharge, pain, vision change ENT/mouth: No nasal congestion, purulent discharge, earache, change in hearing, sore throat  Cardiovascular: No palpitations, paroxysmal nocturnal dyspnea, claudication, edema  Respiratory: No cough, sputum production, hemoptysis, DOE  Gastrointestinal: No dysphagia, abdominal pain, nausea /vomiting, rectal bleeding, melena Genitourinary: No dysuria, hematuria, pyuria, incontinence, nocturia Dermatologic: No rash, pruritus, change in appearance of  skin Neurologic: No dizziness, headache, syncope, seizures, numbness, tingling Psychiatric: No significant anxiety, depression, insomnia, anorexia Endocrine: No change  in hair/skin/nails, excessive thirst, excessive hunger, excessive urination  Hematologic/lymphatic: No significant bruising, lymphadenopathy, abnormal bleeding Allergy/immunology: No itchy/watery eyes, significant sneezing, urticaria, angioedema  Physical exam:  Pertinent or positive findings: Bilateral ptosis noted. There are some crowding of the posterior oropharynx.  Slight tachycardia is noted.  Central obesity is present.  She has trace edema at the sock line.  She has slight scattered ecchymotic lesions over the right forearm.  There is slight interosseous wasting.  General appearance: Adequately nourished; no acute distress, increased work of breathing is present.   Lymphatic: No lymphadenopathy about the head, neck, axilla. Eyes: No conjunctival inflammation or lid edema is present. There is no scleral icterus. Ears:  External ear exam shows no significant lesions or deformities.   Nose:  External nasal examination shows no deformity or inflammation. Nasal mucosa are pink and moist without lesions, exudates Oral exam: Lips and gums are healthy appearing.There is no oropharyngeal erythema or exudate. Neck:  No thyromegaly, masses, tenderness noted.    Heart:  No gallop, murmur, click, rub.  Lungs: Chest clear to auscultation without wheezes, rhonchi, rales, rubs. Abdomen: Bowel sounds are normal.  Abdomen is soft and nontender with no organomegaly, hernias, masses. GU: Deferred  Extremities:  No cyanosis, clubbing. Neurologic exam:  Strength equal  in upper & lower extremities. Balance, Rhomberg, finger to nose testing could not be completed due to clinical state Deep tendon reflexes are equal Skin: Warm & dry w/o tenting. No significant lesions or rash.  See clinical summary under each active problem in the Problem List with associated updated therapeutic plan :  Closed displaced fracture of right femoral neck (HCC) PT/OT at SNF as tolerated.  Orthopedic follow-up as  scheduled.  Stage 3b chronic kidney disease (CKD) (HCC) Final creatinine 1.13 with a GFR of 51, compatible with CKD stage IIIa.  Med list reviewed; no indication for change in meds or dosages at this time.  Continue to monitor.  OSA (obstructive sleep apnea) She never employed CPAP.  Risk of dysrhythmias and associated CVA discussed with her.  Opioid related respiratory suppression risk in the context of OSA discussed.  Chest pain, musculoskeletal Zostrix gel twice daily ttopically will be employed.  D-dimer will be checked to rule out PTE to assess need for venous Doppler & CTA of chest.  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    "

## 2024-12-12 ENCOUNTER — Other Ambulatory Visit (HOSPITAL_COMMUNITY)
Admission: RE | Admit: 2024-12-12 | Discharge: 2024-12-12 | Disposition: A | Discharge status: Home / Self Care | Source: Skilled Nursing Facility | Attending: Adult Health | Admitting: Adult Health

## 2024-12-12 ENCOUNTER — Non-Acute Institutional Stay (SKILLED_NURSING_FACILITY): Payer: Self-pay | Admitting: Adult Health

## 2024-12-12 ENCOUNTER — Other Ambulatory Visit (HOSPITAL_COMMUNITY): Payer: Self-pay | Admitting: Internal Medicine

## 2024-12-12 ENCOUNTER — Encounter: Payer: Self-pay | Admitting: Adult Health

## 2024-12-12 DIAGNOSIS — R7989 Other specified abnormal findings of blood chemistry: Secondary | ICD-10-CM

## 2024-12-12 DIAGNOSIS — S72001A Fracture of unspecified part of neck of right femur, initial encounter for closed fracture: Secondary | ICD-10-CM | POA: Diagnosis not present

## 2024-12-12 DIAGNOSIS — M519 Unspecified thoracic, thoracolumbar and lumbosacral intervertebral disc disorder: Secondary | ICD-10-CM | POA: Insufficient documentation

## 2024-12-12 LAB — D-DIMER, QUANTITATIVE: D-Dimer, Quant: 4.83 ug{FEU}/mL — ABNORMAL HIGH (ref 0.00–0.50)
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
## 2024-12-13 ENCOUNTER — Telehealth: Payer: Self-pay

## 2024-12-13 ENCOUNTER — Ambulatory Visit (HOSPITAL_COMMUNITY)
Admission: RE | Admit: 2024-12-13 | Discharge: 2024-12-13 | Disposition: A | Source: Ambulatory Visit | Attending: Internal Medicine | Admitting: Internal Medicine

## 2024-12-13 ENCOUNTER — Ambulatory Visit: Payer: Self-pay

## 2024-12-13 DIAGNOSIS — R7989 Other specified abnormal findings of blood chemistry: Secondary | ICD-10-CM | POA: Insufficient documentation

## 2024-12-13 MED ORDER — IOHEXOL 350 MG/ML SOLN
100.0000 mL | Freq: Once | INTRAVENOUS | Status: AC | PRN
  Administered 2024-12-13: 100 mL via INTRAVENOUS

## 2024-12-13 NOTE — Telephone Encounter (Signed)
 On call provider was notified by Dr. MYRTIS Hockey regarding CT result stating patient is positive for pulmonary embolism and that patient is already started on Eliquis  which was started by Barnie Seip, NP.

## 2024-12-13 NOTE — Telephone Encounter (Signed)
" ° °  Copied from CRM #[id number]. Topic: Clinical - Lab/Test Results >> Dec 13, 2024  4:21 PM Kathryn Lynn wrote: Reason for CRM: [facility] is calling in with critical results.This encounter was created in error - please disregard. "

## 2024-12-13 NOTE — Telephone Encounter (Signed)
 Spoke with Marjorie from [employer] from [facility] stating that they will need a prior authorization  for the CT Scan for the Stat Order that was order by Elsie Roses, M.D. through her insurance because patient is coming today at 3:00 pm.  Marjorie would like a callback one the authorization goes through her insurance the number is [phone].   Message sent to Elsie Roses, M.D. and Landy Barnie RAMAN, NP

## 2024-12-13 NOTE — Telephone Encounter (Signed)
 Just FYI: Patient is currently in [facility].  She recently had hip fracture surgery and [facility] is attending at the facility.  Radiology called our office with critical CT result b/c Dr. Catherine listed as PCP, although she was not the ordering physician.   I reviewed the chart and it appears that pt was started on Eliquis  yesterday by NP Barnie Seip due to elevated D-dimer and suspicion of PE. I called [facility] care today and let them know that I was called with the result (+PE, no heart strain).

## 2024-12-13 NOTE — Telephone Encounter (Signed)
 Copied from CRM #[id number]. Topic: Clinical - Request for Lab/Test Order >> Dec 13, 2024 11:02 AM Diannia DEL wrote: Reason for CRM: [employer] is calling from Radiology scheduling and wanted to speak to someone because the radiologist stated they needed a corrected order by the provider. Callback number is [phone].  Order: CT with chest angio within or without. (If correct) Appt is stat for the afternoon and in order for the patient to keep this it needs to be done asap.

## 2024-12-13 NOTE — Telephone Encounter (Signed)
 Received critical result from [facility], physician requesting to speak with another provider

## 2024-12-13 NOTE — Telephone Encounter (Signed)
 FYI Only or Action Required?: FYI only for provider: Critical Chest CT results.  Patient was last seen in primary care on 08/13/2024 by Catherine Fuller A, DO.  Called Nurse Triage reporting Results.  Triage Disposition: Call PCP Now  Patient/caregiver understands and will follow disposition?: Yes    Dr. Candise calling to report critical CT scan results showing PE. The results were mistakenly called to him, asking to contact [facility] on call provider as pt was transferred there. Needing to relay results to Kathryn Seip, NP at [facility]. Called [facility] on call provider Kathryn Serum, NP. Dr. Candise able to relay results. Results below:  IMPRESSION: 1. Small amount of pulmonary embolism within proximal segmental branches of the bilateral pulmonary arteries. 2. Evidence of prior cholecystectomy. 3. Simple left renal cyst.    Copied from CRM #[id number]. Topic: General - Other >> Dec 13, 2024  4:53 PM Kathryn Lynn wrote: Reason for CRM: Dr MYRTIS Candise communicating an abnormal ct scan result would like to speak with the on call doctor   Dr MYRTIS Candise [phone] Reason for Disposition  Doctor (or NP/PA) call to PCP  Answer Assessment - Initial Assessment Questions 1. REASON FOR CALL or QUESTION: What is your reason for calling today? or How can I best     Dr. Candise calling to report critical Chest CT results accidentally relayed to him for this pt.  2. CALLER: Document the source of call. (e.g., laboratory staff, caregiver or patient).     Dr. Candise  Protocols used: PCP Call - No Triage-A-AH

## 2024-12-14 ENCOUNTER — Other Ambulatory Visit (HOSPITAL_COMMUNITY)
Admission: RE | Admit: 2024-12-14 | Discharge: 2024-12-14 | Disposition: A | Source: Skilled Nursing Facility | Attending: Adult Health | Admitting: Adult Health

## 2024-12-14 DIAGNOSIS — S72002D Fracture of unspecified part of neck of left femur, subsequent encounter for closed fracture with routine healing: Secondary | ICD-10-CM | POA: Insufficient documentation

## 2024-12-14 LAB — CBC
HCT: 34.2 % — ABNORMAL LOW (ref 36.0–46.0)
Hemoglobin: 10.9 g/dL — ABNORMAL LOW (ref 12.0–15.0)
MCH: 28 pg (ref 26.0–34.0)
MCHC: 31.9 g/dL (ref 30.0–36.0)
MCV: 87.9 fL (ref 80.0–100.0)
Platelets: 326 K/uL (ref 150–400)
RBC: 3.89 MIL/uL (ref 3.87–5.11)
RDW: 15.4 % (ref 11.5–15.5)
WBC: 7.7 K/uL (ref 4.0–10.5)
nRBC: 0 % (ref 0.0–0.2)

## 2024-12-18 ENCOUNTER — Other Ambulatory Visit: Payer: Self-pay | Admitting: Adult Health

## 2024-12-18 ENCOUNTER — Non-Acute Institutional Stay (SKILLED_NURSING_FACILITY): Payer: Self-pay | Admitting: Adult Health

## 2024-12-18 ENCOUNTER — Encounter: Payer: Self-pay | Admitting: Adult Health

## 2024-12-18 DIAGNOSIS — E782 Mixed hyperlipidemia: Secondary | ICD-10-CM | POA: Diagnosis not present

## 2024-12-18 DIAGNOSIS — S72001A Fracture of unspecified part of neck of right femur, initial encounter for closed fracture: Secondary | ICD-10-CM | POA: Diagnosis not present

## 2024-12-18 DIAGNOSIS — N1832 Chronic kidney disease, stage 3b: Secondary | ICD-10-CM

## 2024-12-18 MED ORDER — APIXABAN 5 MG PO TABS: 5.0000 mg | ORAL_TABLET | Freq: Two times a day (BID) | ORAL | 0 refills | Status: DC

## 2024-12-18 MED ORDER — MECLIZINE HCL 25 MG PO TABS: 25.0000 mg | ORAL_TABLET | Freq: Three times a day (TID) | ORAL | 0 refills | Status: AC | PRN

## 2024-12-18 MED ORDER — SIMVASTATIN 20 MG PO TABS: 20.0000 mg | ORAL_TABLET | Freq: Every day | ORAL | 0 refills | Status: DC

## 2024-12-18 MED ORDER — GABAPENTIN 300 MG PO CAPS: 300.0000 mg | ORAL_CAPSULE | Freq: Two times a day (BID) | ORAL | 0 refills | Status: DC

## 2024-12-18 MED ORDER — CIMETIDINE 200 MG PO TABS: 200.0000 mg | ORAL_TABLET | Freq: Two times a day (BID) | ORAL | 0 refills | Status: AC

## 2024-12-18 MED ORDER — FERROUS SULFATE 325 (65 FE) MG PO TABS: 325.0000 mg | ORAL_TABLET | Freq: Two times a day (BID) | ORAL | 0 refills | Status: DC

## 2024-12-18 MED ORDER — MAGNESIUM OXIDE -MG SUPPLEMENT 400 (240 MG) MG PO TABS: 400.0000 mg | ORAL_TABLET | Freq: Every day | ORAL | 0 refills | Status: AC
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
## 2024-12-23 ENCOUNTER — Telehealth: Payer: Self-pay

## 2024-12-23 NOTE — Telephone Encounter (Signed)
 Communication  Reason for CRM: Patient returning office call to Ms. Hamilton. Patient requesting a call back

## 2024-12-23 NOTE — Transitions of Care (Post Inpatient/ED Visit) (Signed)
" ° °  12/23/2024  Name: TANEA MOGA MRN: [id number] DOB: 05-29-1951  Today's TOC FU Call Status: Today's TOC FU Call Status:: Unsuccessful Call (1st Attempt) Unsuccessful Call (1st Attempt) Date: 12/23/24  Attempted to reach the patient regarding the most recent Inpatient/ED visit.  Follow Up Plan: Additional outreach attempts will be made to reach the patient to complete the Transitions of Care (Post Inpatient/ED visit) call.   Signature Julian Lemmings, LPN [facility] Nurse Health Advisor Direct Dial [phone]  "

## 2024-12-23 NOTE — Telephone Encounter (Signed)
 Communication  Caller/Agency: Sonny Gaba    Callback Number: [phone]    Service Requested: Physical Therapy    Frequency: 1 week 9  Any new concerns about the patient? No

## 2024-12-24 ENCOUNTER — Telehealth: Payer: Self-pay

## 2024-12-24 NOTE — Transitions of Care (Post Inpatient/ED Visit) (Signed)
 "  12/24/2024  Name: Kathryn Lynn MRN: [id number] DOB: 12-29-1950  Today's TOC FU Call Status: Today's TOC FU Call Status:: Successful TOC FU Call Completed TOC FU Call Complete Date: 12/24/24  Patient's Name and Date of Birth confirmed. Name, DOB  Transition Care Management Follow-up Telephone Call Date of Discharge: 12/20/24 Discharge Facility: Other [facility]) Name of Other ([facility]) Discharge Facility: [facility] Type of Discharge: Inpatient Admission Primary Inpatient Discharge Diagnosis:: PE How have you been since you were released from the hospital?: Better Any questions or concerns?: No  Items Reviewed: Did you receive and understand the discharge instructions provided?: Yes Medications obtained,verified, and reconciled?: Yes (Medications Reviewed) Any new allergies since your discharge?: No Dietary orders reviewed?: Yes Do you have support at home?: Yes People in Home [RPT]: spouse  Medications Reviewed Today: Medications Reviewed Today     Reviewed by Emmitt Pan, LPN (Licensed Practical Nurse) on 12/24/24 at 1158  Med List Status: <None>   Medication Order Taking? Sig Documenting Provider Last Dose Status Informant  acetaminophen  (TYLENOL ) 325 MG tablet [id number] Yes Take 1-2 tablets (325-650 mg total) by mouth every 6 (six) hours as needed for mild pain (pain score 1-3) or fever (or temp > 100.5). Johnson, Clanford L, MD  Active   apixaban  (ELIQUIS ) 5 MG TABS tablet [id number] Yes Take 1 tablet (5 mg total) by mouth 2 (two) times daily. Landy Barnie RAMAN, NP  Active   Calcium Carb-Cholecalciferol  (CALCIUM 600 + D PO) [id number] Yes Take 1 tablet by mouth daily. [provider]  Active Self, Pharmacy Records  cimetidine  (TAGAMET ) 200 MG tablet [id number] Yes Take 1 tablet (200 mg total) by mouth 2 (two) times daily. Landy Barnie RAMAN, NP  Active   diclofenac  Sodium (VOLTAREN ) 1 % GEL [id number] Yes Apply 2-4 g topically 4 (four) times daily as  needed.  Patient taking differently: Apply 2-4 g topically 4 (four) times daily as needed. Special Instructions: Apply to left chest/rib area. Twice A Day 10:00 AM, 10:00 PM   Johnson, Clanford L, MD  Active   Docusate Sodium  (DSS) 100 MG CAPS [id number] Yes Take 1 capsule (100 mg total) by mouth in the morning and at bedtime. Vicci Afton CROME, MD  Active   ferrous sulfate  325 (65 FE) MG tablet [id number] Yes Take 1 tablet (325 mg total) by mouth in the morning and at bedtime. Twice A Day 10:00 AM, 10:00 PM Landy Barnie RAMAN, NP  Active   fluticasone  (FLONASE ) 50 MCG/ACT nasal spray [id number] Yes Place 1 spray into both nostrils daily as needed for allergies. [provider]  Active Self, Pharmacy Records  gabapentin  (NEURONTIN ) 300 MG capsule [id number] Yes Take 1 capsule (300 mg total) by mouth 2 (two) times daily. Landy Barnie RAMAN, NP  Active   loratadine (CLARITIN) 10 MG tablet [id number] Yes Take 10 mg by mouth daily. Once A Day 10:00 AM [provider]  Active   magnesium  oxide (MAG-OX) 400 (240 Mg) MG tablet [id number] Yes Take 1 tablet (400 mg total) by mouth daily. Landy Barnie RAMAN, NP  Active   meclizine  (ANTIVERT ) 25 MG tablet [id number] Yes Take 1 tablet (25 mg total) by mouth 3 (three) times daily as needed for dizziness. Landy Barnie RAMAN, NP  Active   Melatonin 10 MG TABS [id number] Yes Take 10 mg by mouth at bedtime as needed (sleep). [provider]  Active Self, Pharmacy Records  Omega-3 Fatty Acids (OMEGA-3 FISH OIL ) 300 MG CAPS [id number] Yes Take 300  mg by mouth daily in the afternoon. [provider]  Active Self, Pharmacy Records  simvastatin  (ZOCOR ) 20 MG tablet [id number] Yes Take 1 tablet (20 mg total) by mouth at bedtime. Landy Barnie RAMAN, NP  Active   Vitamin D , Cholecalciferol , 25 MCG (1000 UT) CAPS [id number] Yes Take 1,000 Units by mouth daily. [provider]  Active Self, Pharmacy Records  vitamin E 180 MG (400 UNITS) capsule  [id number] Yes Take 400 Units by mouth daily. [provider]  Active Self, Pharmacy Records            Home Care and Equipment/Supplies: Were Home Health Services Ordered?: Yes Name of Home Health Agency:: unknown Has Agency set up a time to come to your home?: Yes First Home Health Visit Date: 12/21/24 Any new equipment or medical supplies ordered?: NA  Functional Questionnaire: Do you need assistance with bathing/showering or dressing?: No Do you need assistance with meal preparation?: No Do you need assistance with eating?: No Do you have difficulty maintaining continence: No Do you need assistance with getting out of bed/getting out of a chair/moving?: No Do you have difficulty managing or taking your medications?: No  Follow up appointments reviewed: PCP Follow-up appointment confirmed?: Yes Date of PCP follow-up appointment?: 12/30/24 Follow-up Provider: [facility] Follow-up appointment confirmed?: NA Do you need transportation to your follow-up appointment?: No Do you understand care options if your condition(s) worsen?: Yes-patient verbalized understanding    SIGNATURE Julian Lemmings, LPN [facility] Nurse Health Advisor Direct Dial [phone]  "

## 2024-12-25 ENCOUNTER — Telehealth: Payer: Self-pay

## 2024-12-25 NOTE — Telephone Encounter (Signed)
 Copied from CRM [id number]. Topic: Clinical - Home Health Verbal Orders >> Dec 25, 2024  1:34 PM [place] E wrote: Caller/Agency: Mariel Gaba [facility] Callback Number: [phone] Service Requested: Occupational Therapy Frequency: 1x/week for 2 weeks Any new concerns about the patient? No

## 2024-12-25 NOTE — Telephone Encounter (Signed)
Please advise if VO approved

## 2024-12-26 ENCOUNTER — Telehealth: Payer: Self-pay | Admitting: Orthopedic Surgery

## 2024-12-26 NOTE — Telephone Encounter (Signed)
 Dr. Areatha pt GLENWOOD Raring PT w/[facility] [phone] lvm requesting that Dr. VEAR sign off on orders for [facility] would like to confirm that he will.  The original orders came from the [facility] and they tried to get orders from PCP, but they told her to go through the pt's ortho.

## 2024-12-26 NOTE — Telephone Encounter (Signed)
 Spoke with Sonny from [facility] and informed her all orders need to be sent to her Orthopedic Surgery team.

## 2024-12-26 NOTE — Telephone Encounter (Signed)
 This provider cannot approve her physical therapy since I have not seen her in 4 months. I believe this is due to a recent orthopedic condition, in which they will need to call the orthopedic surgeon in order to have orders approved

## 2024-12-26 NOTE — Telephone Encounter (Signed)
 This provider has not seen this patient in 4 months.  I believe this is secondary to an orthopedic issue she was recently having.  They will need to call the orthopedic surgeon for an order to have orders approved

## 2024-12-26 NOTE — Telephone Encounter (Signed)
 I called to advise yes

## 2024-12-26 NOTE — Telephone Encounter (Signed)
 Sonny from [facility] made aware.

## 2024-12-26 NOTE — Telephone Encounter (Signed)
 Dr. Areatha pt Kathryn Lynn PT w/[facility] [phone] lvm requesting that Dr. VEAR sign off on orders for [facility] would like to confirm that he will. The original orders came from the [facility] and they tried to get orders from PCP, but they told her to go through the pt's ortho.

## 2024-12-26 NOTE — Telephone Encounter (Signed)
 Dr. Areatha pt GLENWOOD Raring PT w/[facility] [phone] lvm stating she wanted to confirm if Dr. VEAR will

## 2024-12-30 ENCOUNTER — Inpatient Hospital Stay: Admitting: Family Medicine

## 2024-12-30 ENCOUNTER — Telehealth: Payer: Self-pay | Admitting: Orthopedic Surgery

## 2024-12-30 NOTE — Telephone Encounter (Signed)
 Dr. Areatha pt - Mallory OT w/[facility] [phone] lvm requesting OT 1w2.

## 2025-01-01 NOTE — Telephone Encounter (Signed)
 I called to give the verbal orders

## 2025-01-02 ENCOUNTER — Encounter: Payer: Self-pay | Admitting: Orthopedic Surgery

## 2025-01-02 ENCOUNTER — Ambulatory Visit (INDEPENDENT_AMBULATORY_CARE_PROVIDER_SITE_OTHER): Admitting: Orthopedic Surgery

## 2025-01-02 DIAGNOSIS — S72001A Fracture of unspecified part of neck of right femur, initial encounter for closed fracture: Secondary | ICD-10-CM

## 2025-01-02 NOTE — Progress Notes (Signed)
" ° ° °  01/02/2025   Chief Complaint  Patient presents with   Post-op Follow-up    Right hip    Encounter Diagnosis  Name Primary?   Closed displaced fracture of right femoral neck (HCC) Bipolar replacement on 12/05/24 Yes    What pharmacy do you use ? ___[facility] or [facility]________  DOI/DOS/ Date: 12/05/24  Did you get better, worse or no change (Answer below)   Improved walking no cane or walker, doing well       "

## 2025-01-02 NOTE — Progress Notes (Signed)
 "   POST OP VISIT   Patient: Kathryn Lynn           Date of Birth: [birth year]           MRN: [id number] Visit Date: 01/02/2025 Requested by: Catherine Charlies LABOR, DO [address] [place],  [place] [address] PCP: Catherine Charlies LABOR, DO   Encounter Diagnosis  Name Primary?   Closed displaced fracture of right femoral neck (HCC) Bipolar replacement on 12/05/24 Yes   PROCEDURE: Bipolar replacement for right femoral neck fracture  Chief Complaint  Patient presents with   Post-op Follow-up    Right hip    Allergies[1]   Current Medications[2]   IMAGING: No results found.   ASSESSMENT AND PLAN:  74 year old female currently on Eliquis  for DVT prevention and blood clots status post right bipolar hip replacement approximately 1 month ago doing well walking without a cane mild soreness over both hips history of lumbosacral fusion prior to this procedure  Follow-up in 6 weeks  Excuse from jury duty secondary to recent surgery  I have reviewed the patient's history and given the presence of a fragility fracture, I have deemed the necessity of a osteoporosis management referral or confirmed that the patient is currently enrolled in a osteoporosis treatment program. Bone density completed May 30 2024 currently on calcium vitamin D     [1]  Allergies Allergen Reactions   Adhesive [Tape] Rash   Cephalosporins Rash   Clindamycin Palpitations   Latex Rash   Penicillins Rash  [2]  Current Outpatient Medications:    acetaminophen  (TYLENOL ) 325 MG tablet, Take 1-2 tablets (325-650 mg total) by mouth every 6 (six) hours as needed for mild pain (pain score 1-3) or fever (or temp > 100.5)., Disp: , Rfl:    apixaban  (ELIQUIS ) 5 MG TABS tablet, Take 1 tablet (5 mg total) by mouth 2 (two) times daily., Disp: 60 tablet, Rfl: 0   Calcium Carb-Cholecalciferol  (CALCIUM 600 + D PO), Take 1 tablet by mouth daily., Disp: , Rfl:    cimetidine  (TAGAMET ) 200 MG tablet, Take 1 tablet (200 mg total) by mouth 2  (two) times daily., Disp: 60 tablet, Rfl: 0   diclofenac  Sodium (VOLTAREN ) 1 % GEL, Apply 2-4 g topically 4 (four) times daily as needed. (Patient taking differently: Apply 2-4 g topically 4 (four) times daily as needed. Special Instructions: Apply to left chest/rib area. Twice A Day 10:00 AM, 10:00 PM), Disp: , Rfl:    Docusate Sodium  (DSS) 100 MG CAPS, Take 1 capsule (100 mg total) by mouth in the morning and at bedtime., Disp: , Rfl:    ferrous sulfate  325 (65 FE) MG tablet, Take 1 tablet (325 mg total) by mouth in the morning and at bedtime. Twice A Day 10:00 AM, 10:00 PM, Disp: 60 tablet, Rfl: 0   fluticasone  (FLONASE ) 50 MCG/ACT nasal spray, Place 1 spray into both nostrils daily as needed for allergies., Disp: , Rfl:    gabapentin  (NEURONTIN ) 300 MG capsule, Take 1 capsule (300 mg total) by mouth 2 (two) times daily., Disp: 60 capsule, Rfl: 0   loratadine (CLARITIN) 10 MG tablet, Take 10 mg by mouth daily. Once A Day 10:00 AM, Disp: , Rfl:    magnesium  oxide (MAG-OX) 400 (240 Mg) MG tablet, Take 1 tablet (400 mg total) by mouth daily., Disp: 30 tablet, Rfl: 0   meclizine  (ANTIVERT ) 25 MG tablet, Take 1 tablet (25 mg total) by mouth 3 (three) times daily as needed for dizziness., Disp:  30 tablet, Rfl: 0   Melatonin 10 MG TABS, Take 10 mg by mouth at bedtime as needed (sleep)., Disp: , Rfl:    Omega-3 Fatty Acids (OMEGA-3 FISH OIL ) 300 MG CAPS, Take 300 mg by mouth daily in the afternoon., Disp: , Rfl:    simvastatin  (ZOCOR ) 20 MG tablet, Take 1 tablet (20 mg total) by mouth at bedtime., Disp: 30 tablet, Rfl: 0   Vitamin D , Cholecalciferol , 25 MCG (1000 UT) CAPS, Take 1,000 Units by mouth daily., Disp: , Rfl:    vitamin E 180 MG (400 UNITS) capsule, Take 400 Units by mouth daily., Disp: , Rfl:   "

## 2025-01-03 ENCOUNTER — Encounter: Payer: Self-pay | Admitting: Family Medicine

## 2025-01-03 ENCOUNTER — Ambulatory Visit: Payer: Self-pay | Admitting: Family Medicine

## 2025-01-03 ENCOUNTER — Ambulatory Visit: Admitting: Family Medicine

## 2025-01-03 VITALS — BP 118/62 | HR 76 | Temp 98.0°F | Wt 187.4 lb

## 2025-01-03 DIAGNOSIS — S72001A Fracture of unspecified part of neck of right femur, initial encounter for closed fracture: Secondary | ICD-10-CM

## 2025-01-03 DIAGNOSIS — Z8781 Personal history of (healed) traumatic fracture: Secondary | ICD-10-CM

## 2025-01-03 DIAGNOSIS — I2699 Other pulmonary embolism without acute cor pulmonale: Secondary | ICD-10-CM | POA: Diagnosis not present

## 2025-01-03 DIAGNOSIS — E782 Mixed hyperlipidemia: Secondary | ICD-10-CM

## 2025-01-03 DIAGNOSIS — N1832 Chronic kidney disease, stage 3b: Secondary | ICD-10-CM

## 2025-01-03 DIAGNOSIS — E669 Obesity, unspecified: Secondary | ICD-10-CM

## 2025-01-03 DIAGNOSIS — Z96641 Presence of right artificial hip joint: Secondary | ICD-10-CM

## 2025-01-03 DIAGNOSIS — D509 Iron deficiency anemia, unspecified: Secondary | ICD-10-CM

## 2025-01-03 LAB — CBC
HCT: 37.6 % (ref 36.0–46.0)
Hemoglobin: 12.2 g/dL (ref 12.0–15.0)
MCHC: 32.5 g/dL (ref 30.0–36.0)
MCV: 85.4 fl (ref 78.0–100.0)
Platelets: 274 10*3/uL (ref 150.0–400.0)
RBC: 4.4 Mil/uL (ref 3.87–5.11)
RDW: 16.2 % — ABNORMAL HIGH (ref 11.5–15.5)
WBC: 7.9 10*3/uL (ref 4.0–10.5)

## 2025-01-03 LAB — COMPREHENSIVE METABOLIC PANEL WITH GFR
ALT: 10 U/L (ref 3–35)
AST: 13 U/L (ref 5–37)
Albumin: 4 g/dL (ref 3.5–5.2)
Alkaline Phosphatase: 77 U/L (ref 39–117)
BUN: 17 mg/dL (ref 6–23)
CO2: 30 meq/L (ref 19–32)
Calcium: 9.4 mg/dL (ref 8.4–10.5)
Chloride: 104 meq/L (ref 96–112)
Creatinine, Ser: 1.03 mg/dL (ref 0.40–1.20)
GFR: 53.94 mL/min — ABNORMAL LOW
Glucose, Bld: 86 mg/dL (ref 70–99)
Potassium: 4.6 meq/L (ref 3.5–5.1)
Sodium: 142 meq/L (ref 135–145)
Total Bilirubin: 1 mg/dL (ref 0.2–1.2)
Total Protein: 6.6 g/dL (ref 6.0–8.3)

## 2025-01-03 LAB — IBC + FERRITIN
Ferritin: 25.3 ng/mL (ref 10.0–291.0)
Iron: 63 ug/dL (ref 42–145)
Saturation Ratios: 18.8 % — ABNORMAL LOW (ref 20.0–50.0)
TIBC: 334.6 ug/dL (ref 250.0–450.0)
Transferrin: 239 mg/dL (ref 212.0–360.0)

## 2025-01-03 MED ORDER — SIMVASTATIN 20 MG PO TABS: 20.0000 mg | ORAL_TABLET | Freq: Every day | ORAL | 1 refills | Status: AC

## 2025-01-03 MED ORDER — APIXABAN 5 MG PO TABS: 5.0000 mg | ORAL_TABLET | Freq: Two times a day (BID) | ORAL | 4 refills | Status: AC

## 2025-01-03 MED ORDER — GABAPENTIN 300 MG PO CAPS: 300.0000 mg | ORAL_CAPSULE | Freq: Two times a day (BID) | ORAL | 1 refills | Status: AC
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
## 2025-01-07 ENCOUNTER — Encounter: Payer: Self-pay | Admitting: Family Medicine

## 2025-01-07 NOTE — Patient Instructions (Signed)

## 2025-02-13 ENCOUNTER — Encounter: Admitting: Orthopedic Surgery

## 2025-02-27 ENCOUNTER — Encounter: Admitting: Family Medicine

## 2025-03-04 ENCOUNTER — Encounter: Admitting: Family Medicine

## 2025-05-07 ENCOUNTER — Encounter
